# Patient Record
Sex: Female | Born: 1960 | Race: Black or African American | Hispanic: No | Smoking: Never smoker
Health system: Southern US, Community
[De-identification: ages and names within clinical notes are randomized; demographics above are authoritative.]

## PROBLEM LIST (undated history)

## (undated) DIAGNOSIS — N950 Postmenopausal bleeding: Secondary | ICD-10-CM

## (undated) DIAGNOSIS — E119 Type 2 diabetes mellitus without complications: Secondary | ICD-10-CM

## (undated) DIAGNOSIS — I10 Essential (primary) hypertension: Secondary | ICD-10-CM

## (undated) DIAGNOSIS — M67911 Unspecified disorder of synovium and tendon, right shoulder: Secondary | ICD-10-CM

## (undated) DIAGNOSIS — Z8639 Personal history of other endocrine, nutritional and metabolic disease: Secondary | ICD-10-CM

## (undated) DIAGNOSIS — R519 Headache, unspecified: Secondary | ICD-10-CM

## (undated) DIAGNOSIS — E7800 Pure hypercholesterolemia, unspecified: Secondary | ICD-10-CM

## (undated) DIAGNOSIS — E785 Hyperlipidemia, unspecified: Secondary | ICD-10-CM

## (undated) HISTORY — DX: Pure hypercholesterolemia, unspecified: E78.00

## (undated) HISTORY — DX: Type 2 diabetes mellitus without complications: E11.9

## (undated) HISTORY — PX: INDUCED ABORTION: SHX677

## (undated) HISTORY — DX: Personal history of other endocrine, nutritional and metabolic disease: Z86.39

## (undated) HISTORY — PX: TUBAL LIGATION: SHX77

---

## 2008-06-06 ENCOUNTER — Ambulatory Visit: Payer: Self-pay | Admitting: Nurse Practitioner

## 2008-06-06 DIAGNOSIS — I10 Essential (primary) hypertension: Secondary | ICD-10-CM

## 2008-06-06 DIAGNOSIS — E1159 Type 2 diabetes mellitus with other circulatory complications: Secondary | ICD-10-CM | POA: Insufficient documentation

## 2008-06-06 DIAGNOSIS — J309 Allergic rhinitis, unspecified: Secondary | ICD-10-CM | POA: Insufficient documentation

## 2008-06-06 LAB — CONVERTED CEMR LAB
AST: 12 units/L (ref 0–37)
Basophils Relative: 1 % (ref 0–1)
CO2: 24 meq/L (ref 19–32)
Chloride: 105 meq/L (ref 96–112)
Eosinophils Absolute: 0.3 10*3/uL (ref 0.0–0.7)
Lymphs Abs: 3.7 10*3/uL (ref 0.7–4.0)
MCHC: 31.9 g/dL (ref 30.0–36.0)
Potassium: 4.2 meq/L (ref 3.5–5.3)
RBC: 5.51 M/uL — ABNORMAL HIGH (ref 3.87–5.11)
RDW: 16 % — ABNORMAL HIGH (ref 11.5–15.5)
TSH: 0.674 microintl units/mL (ref 0.350–4.50)
WBC: 8.4 10*3/uL (ref 4.0–10.5)

## 2008-06-07 ENCOUNTER — Encounter (INDEPENDENT_AMBULATORY_CARE_PROVIDER_SITE_OTHER): Payer: Self-pay | Admitting: Nurse Practitioner

## 2008-07-13 ENCOUNTER — Ambulatory Visit: Payer: Self-pay | Admitting: Nurse Practitioner

## 2008-07-13 ENCOUNTER — Encounter (INDEPENDENT_AMBULATORY_CARE_PROVIDER_SITE_OTHER): Payer: Self-pay | Admitting: Nurse Practitioner

## 2008-07-13 LAB — CONVERTED CEMR LAB
Blood in Urine, dipstick: NEGATIVE
Chlamydia, DNA Probe: NEGATIVE
Cholesterol: 174 mg/dL (ref 0–200)
GC Probe Amp, Genital: NEGATIVE
KOH Prep: NEGATIVE
LDL Cholesterol: 118 mg/dL — ABNORMAL HIGH (ref 0–99)
Protein, U semiquant: NEGATIVE
Triglycerides: 79 mg/dL (ref <150)
VLDL: 16 mg/dL (ref 0–40)

## 2008-07-14 ENCOUNTER — Encounter (INDEPENDENT_AMBULATORY_CARE_PROVIDER_SITE_OTHER): Payer: Self-pay | Admitting: Nurse Practitioner

## 2008-07-18 ENCOUNTER — Ambulatory Visit (HOSPITAL_COMMUNITY): Admission: RE | Admit: 2008-07-18 | Discharge: 2008-07-18 | Payer: Self-pay | Admitting: Family Medicine

## 2008-07-22 ENCOUNTER — Encounter: Admission: RE | Admit: 2008-07-22 | Discharge: 2008-07-22 | Payer: Self-pay | Admitting: Family Medicine

## 2008-07-22 ENCOUNTER — Encounter (INDEPENDENT_AMBULATORY_CARE_PROVIDER_SITE_OTHER): Payer: Self-pay | Admitting: Family Medicine

## 2008-11-10 ENCOUNTER — Ambulatory Visit: Payer: Self-pay | Admitting: Nurse Practitioner

## 2008-11-10 DIAGNOSIS — E669 Obesity, unspecified: Secondary | ICD-10-CM

## 2008-11-10 LAB — CONVERTED CEMR LAB
Bilirubin Urine: NEGATIVE
Ketones, urine, test strip: NEGATIVE
Nitrite: NEGATIVE
Specific Gravity, Urine: >=1.03
pH: 6

## 2008-11-11 ENCOUNTER — Encounter (INDEPENDENT_AMBULATORY_CARE_PROVIDER_SITE_OTHER): Payer: Self-pay | Admitting: Nurse Practitioner

## 2008-11-11 LAB — CONVERTED CEMR LAB: Microalb, Ur: 5.36 mg/dL — ABNORMAL HIGH (ref 0.00–1.89)

## 2008-12-01 ENCOUNTER — Ambulatory Visit: Payer: Self-pay | Admitting: Nurse Practitioner

## 2009-06-14 ENCOUNTER — Encounter (INDEPENDENT_AMBULATORY_CARE_PROVIDER_SITE_OTHER): Payer: Self-pay | Admitting: Nurse Practitioner

## 2009-06-14 ENCOUNTER — Ambulatory Visit: Payer: Self-pay | Admitting: Internal Medicine

## 2009-08-02 ENCOUNTER — Ambulatory Visit: Payer: Self-pay | Admitting: Nurse Practitioner

## 2009-08-02 DIAGNOSIS — N3941 Urge incontinence: Secondary | ICD-10-CM

## 2009-08-02 LAB — CONVERTED CEMR LAB
Glucose, Urine, Semiquant: NEGATIVE
Microalb, Ur: 1.03 mg/dL (ref 0.00–1.89)
Nitrite: NEGATIVE
Urobilinogen, UA: 0.2

## 2009-08-09 ENCOUNTER — Encounter (INDEPENDENT_AMBULATORY_CARE_PROVIDER_SITE_OTHER): Payer: Self-pay | Admitting: Nurse Practitioner

## 2009-10-05 ENCOUNTER — Ambulatory Visit: Payer: Self-pay | Admitting: Nurse Practitioner

## 2009-10-05 DIAGNOSIS — K029 Dental caries, unspecified: Secondary | ICD-10-CM | POA: Insufficient documentation

## 2009-10-05 HISTORY — DX: Dental caries, unspecified: K02.9

## 2009-10-05 LAB — CONVERTED CEMR LAB
AST: 11 units/L (ref 0–37)
Alkaline Phosphatase: 54 units/L (ref 39–117)
BUN: 14 mg/dL (ref 6–23)
Basophils Absolute: 0.1 10*3/uL (ref 0.0–0.1)
Basophils Relative: 1 % (ref 0–1)
CO2: 28 meq/L (ref 19–32)
Calcium: 8.6 mg/dL (ref 8.4–10.5)
Eosinophils Absolute: 0.2 10*3/uL (ref 0.0–0.7)
HDL: 39 mg/dL — ABNORMAL LOW (ref >39)
Hemoglobin: 13.2 g/dL (ref 12.0–15.0)
Lymphs Abs: 3.1 10*3/uL (ref 0.7–4.0)
MCV: 79.2 fL (ref 78.0–100.0)
Monocytes Relative: 6 % (ref 3–12)
Neutro Abs: 3 10*3/uL (ref 1.7–7.7)
Neutrophils Relative %: 44 % (ref 43–77)
Platelets: 281 10*3/uL (ref 150–400)
TSH: 0.929 microintl units/mL (ref 0.350–4.500)
Total Protein: 6.6 g/dL (ref 6.0–8.3)
WBC: 6.7 10*3/uL (ref 4.0–10.5)

## 2009-10-06 ENCOUNTER — Encounter (INDEPENDENT_AMBULATORY_CARE_PROVIDER_SITE_OTHER): Payer: Self-pay | Admitting: Nurse Practitioner

## 2009-10-09 ENCOUNTER — Encounter: Admission: RE | Admit: 2009-10-09 | Discharge: 2009-10-09 | Payer: Self-pay | Admitting: Internal Medicine

## 2009-10-10 LAB — CONVERTED CEMR LAB: Pap Smear: NEGATIVE

## 2010-02-05 ENCOUNTER — Ambulatory Visit: Payer: Self-pay | Admitting: Nurse Practitioner

## 2010-03-05 ENCOUNTER — Ambulatory Visit: Payer: Self-pay | Admitting: Nurse Practitioner

## 2010-06-19 NOTE — Letter (Signed)
Summary: DENTAL REFERRAL  DENTAL REFERRAL   Imported By: Arta Bruce 10/05/2009 15:47:30  _____________________________________________________________________  External Attachment:    Type:   Image     Comment:   External Document

## 2010-06-19 NOTE — Progress Notes (Signed)
Summary: Office Visit//DEPRESSION SCREENING  Office Visit//DEPRESSION SCREENING   Imported By: Arta Bruce 11/27/2009 10:48:39  _____________________________________________________________________  External Attachment:    Type:   Image     Comment:   External Document

## 2010-06-19 NOTE — Assessment & Plan Note (Signed)
Summary: Complete Physical Exam   Vital Signs:  Patient profile:   50 year old female Menstrual status:  postmenopausal Weight:      228.3 pounds BMI:     49.79 BSA:     2.25 Temp:     98.3 degrees F oral Pulse rate:   70 / minute Pulse rhythm:   regular BP sitting:   135 / 82  (left arm) Cuff size:   large  Vitals Entered By: Levon Hedger (Oct 05, 2009 11:50 AM) CC: CPP, Hypertension Management Is Patient Diabetic? No Pain Assessment Patient in pain? no       Does patient need assistance? Functional Status Self care Ambulation Normal   CC:  CPP and Hypertension Management.  History of Present Illness:  Pt into the office for a complete physical exam  Pap - no recent dental exam post menopausal  mammogram - last done one year ago. had to return for additional testing - u/s which just advised f/u every 6 months  Optho - wears glasses. last eye exam several years ago  dental - no recent dental exam. does admit that she has several teeth that need removal and care  Hypertension History:      She denies headache, chest pain, and palpitations.  She notes no problems with any antihypertensive medication side effects.  restarted on bp meds during last visit.        Positive major cardiovascular risk factors include hypertension.  Negative major cardiovascular risk factors include female age less than 48 years old, negative family history for ischemic heart disease, and non-tobacco-user status.        Further assessment for target organ damage reveals no history of ASHD, cardiac end-organ damage (CHF/LVH), stroke/TIA, peripheral vascular disease, renal insufficiency, or hypertensive retinopathy.     Habits & Providers  Alcohol-Tobacco-Diet     Alcohol drinks/day: 0     Tobacco Status: never  Exercise-Depression-Behavior     Does Patient Exercise: no     Drug Use: no     Seat Belt Use: 100     Sun Exposure: occasionally  Comments: PHQ-9 score =  0  Allergies (verified): No Known Drug Allergies  Review of Systems General:  mouth pain at times from caried teeth. Eyes:  Denies blurring. ENT:  Denies earache. CV:  Denies chest pain or discomfort. Resp:  Denies cough. GI:  Denies abdominal pain, nausea, and vomiting. GU:  Denies discharge. MS:  Denies joint pain. Derm:  Denies rash. Neuro:  Denies headaches. Psych:  Denies anxiety and depression.  Physical Exam  General:  alert.   Head:  normocephalic.   Eyes:  pupils equal and pupils round.   Ears:  bil TM with bony landmarks present Nose:  no nasal discharge.   Mouth:  poor dentition.   Neck:  supple.   Chest Wall:  no tenderness.   Breasts:  pendulous no masses.   Lungs:  normal breath sounds.   Heart:  normal rate and regular rhythm.   Abdomen:  soft, non-tender, and normal bowel sounds.   Rectal:  no external abnormalities.   Msk:  normal ROM.   Pulses:  R radial normal and L radial normal.   Extremities:  no edema Neurologic:  alert & oriented X3.   Skin:  color normal.   Psych:  Oriented X3.    Pelvic Exam  Vulva:      normal appearance.   Urethra and Bladder:      Urethra--normal.  Vagina:      physiologic discharge.   Cervix:      midposition.   Uterus:      smooth.   Adnexa:      nontender bilaterally.   Rectum:      normal, heme negative stool.      Impression & Recommendations:  Problem # 1:  ROUTINE GYNECOLOGICAL EXAMINATION (ICD-V72.31) PHQ- 9 score = 0 EKG done PAP done guaiac negative colonscopy indications given to pt rec eye and dental exam - pt would like referral to dental clinic Orders: UA Dipstick w/o Micro (manual) (16109) KOH/ WET Mount [phone]) Pap Smear, Thin Prep ( Collection of) (Q0091) T- GC Chlamydia (09811) Hemoccult Guaiac-1 spec.(in office) (82270)  Problem # 2:  OTHER SCREENING BREAST EXAMINATION (ICD-V76.19) self breast exams reviewed with pt mammogram scheduled Orders: Mammogram (Screening)  (Mammo)  Problem # 3:  DENTAL CARIES (ICD-521.00) will refer to denal clinic  Problem # 4:  OBESITY (ICD-278.00) advised pt that she needs to increase her exercise  Problem # 5:  HYPERTENSION, BENIGN ESSENTIAL (ICD-401.1) stable at this time DASH diet reviewed Her updated medication list for this problem includes:    Lisinopril-hydrochlorothiazide 20-25 Mg Tabs (Lisinopril-hydrochlorothiazide) .Marland Kitchen... 1 tablet by mouth daily  Orders: T-Lipid Profile (91478-29562) T-Comprehensive Metabolic Panel [phone]) T-CBC w/Diff (96295-28413) T-TSH (24401-02725) Rapid HIV  (92370) EKG w/ Interpretation (93000)  Complete Medication List: 1)  Lisinopril-hydrochlorothiazide 20-25 Mg Tabs (Lisinopril-hydrochlorothiazide) .Marland Kitchen.. 1 tablet by mouth daily 2)  Loratadine 10 Mg Tabs (Loratadine) .Marland Kitchen.. 1 tablet by mouth daily for allergies  Other Orders: Dental Referral (Dentist)  Hypertension Assessment/Plan:      The patient's hypertensive risk group is category A: No risk factors and no target organ damage.  Her calculated 10 year risk of coronary heart disease is 9 %.  Today's blood pressure is 135/82.  Her blood pressure goal is < 140/90.  Patient Instructions: 1)  You will be informed of any abnormal labs. 2)  Your referral will be sent to the dental clinic.  You will be notified directly of the time/date of the appointment 3)  Follow up in 4-6 months or sooner if needed.   EKG  Procedure date:  10/05/2009  Findings:      NSR   Laboratory Results  Date/Time Received: Oct 06, 2009 2:01 PM   [employer] Source: vaginal WBC/hpf: 1-5 Bacteria/hpf: rare Clue cells/hpf: none Yeast/hpf: none Wet Mount KOH: Negative Trichomonas/hpf: none  Stool - Occult Blood Hemmoccult #1: negative Date: 10/06/2009    Appended Document: Complete Physical Exam     Allergies: No Known Drug Allergies   Complete Medication List: 1)  Lisinopril-hydrochlorothiazide 20-25 Mg Tabs  (Lisinopril-hydrochlorothiazide) .Marland Kitchen.. 1 tablet by mouth daily 2)  Loratadine 10 Mg Tabs (Loratadine) .Marland Kitchen.. 1 tablet by mouth daily for allergies   Laboratory Results   Urine Tests  Date/Time Received: Oct 06, 2009 5:36 PM   Routine Urinalysis   Color: lt. yellow Appearance: Clear Glucose: negative   (Normal Range: Negative) Bilirubin: negative   (Normal Range: Negative) Ketone: negative   (Normal Range: Negative) Spec. Gravity: 1.025   (Normal Range: 1.003-1.035) Blood: negative   (Normal Range: Negative) pH: 5.0   (Normal Range: 5.0-8.0) Protein: negative   (Normal Range: Negative) Urobilinogen: 0.2   (Normal Range: 0-1) Nitrite: negative   (Normal Range: Negative) Leukocyte Esterace: negative   (Normal Range: Negative)

## 2010-06-19 NOTE — Letter (Signed)
Summary: Lipid Letter  [facility]  [address] [place], [place] [address]   Phone: [phone]  Fax: [phone]    10/06/2009  Waneta Fitting [address] [place], [place]  [address]  Dear Clydie Braun:  We have carefully reviewed your last lipid profile from 10/05/2009 and the results are noted below with a summary of recommendations for lipid management.    Cholesterol:       146     Goal: less than 200   HDL "good" Cholesterol:   39     Goal: greater than 40   LDL "bad" Cholesterol:   92     Goal: less than 100   Triglycerides:       76     Goal: less than 150    Labs done during recent office visit are normal.  Pap Smear results _____________________.     Current Medications: 1)    Lisinopril-hydrochlorothiazide 20-25 Mg Tabs (Lisinopril-hydrochlorothiazide) .Marland Kitchen.. 1 tablet by mouth daily 2)    Loratadine 10 Mg Tabs (Loratadine) .Marland Kitchen.. 1 tablet by mouth daily for allergies  If you have any questions, please call. We appreciate being able to work with you.   Sincerely,    [facility] Lehman Prom FNP

## 2010-06-19 NOTE — Letter (Signed)
Summary: RETASURE  RETASURE   Imported By: Arta Bruce 10/19/2009 14:29:01  _____________________________________________________________________  External Attachment:    Type:   Image     Comment:   External Document

## 2010-06-19 NOTE — Assessment & Plan Note (Signed)
Summary: 4 WEEK FU FOR BP CHECK///KT  Nurse Visit   Vital Signs:  Patient profile:   50 year old female Menstrual status:  postmenopausal Pulse rate:   88 / minute Pulse rhythm:   regular Resp:     20 per minute BP sitting:   126 / 94  (left arm) Cuff size:   large  Vitals Entered By: Dutch Quint RN (March 05, 2010 3:29 PM)  Patient Instructions: 1)  Reviewed with Wende Mott 2)  Blood pressure is still elevated, but better - 126/94 3)  Continue taking medications as ordered. 4)  Watch salt intake -- avoid frequent use of processed foods such as cold cuts, hot dogs, canned soup. 5)  You received your flu shot today -- please read handout 6)  Return for follow-up blood pressure check in 3 weeks with triage nurse. 7)  Call if anything changes or if you have any questions. 2  Physical Exam  Lungs:  normal respiratory effort, normal breath sounds, no crackles, and no wheezes.   Heart:  normal rate and regular rhythm.     Impression & Recommendations:  Problem # 1:  HYPERTENSION, BENIGN ESSENTIAL (ICD-401.1) BP better, but still elevated  126/94 Continue medications as ordered Advised to watch salt intake, especially use of processed foods and soda Received flu shot Return for f/u BP check in 3 weeks with triage nurse  Her updated medication list for this problem includes:    Lisinopril-hydrochlorothiazide 20-25 Mg Tabs (Lisinopril-hydrochlorothiazide) .Marland Kitchen... 1 tablet by mouth daily  Complete Medication List: 1)  Lisinopril-hydrochlorothiazide 20-25 Mg Tabs (Lisinopril-hydrochlorothiazide) .Marland Kitchen.. 1 tablet by mouth daily 2)  Loratadine 10 Mg Tabs (Loratadine) .Marland Kitchen.. 1 tablet by mouth daily for allergies 3)  Womens Multivitamin Plus Tabs (Multiple vitamins-minerals) .... Take one tablet by mouth daily  Other Orders: Flu Vaccine 75yrs + (74259) Admin 1st Vaccine (56387)   CC:  4 week BP recheck.  History of Present Illness: Took all of her meds today.  Denies cough,  visual changes.  States she only has headache when she gets up in the morning.   Review of Systems CV:  Denies bluish discoloration of lips or nails, chest pain or discomfort, difficulty breathing at night, difficulty breathing while lying down, fainting, fatigue, leg cramps with exertion, lightheadness, near fainting, palpitations, shortness of breath with exertion, swelling of feet, swelling of hands, and weight gain.  CC: 4 week BP recheck Is Patient Diabetic? No  Does patient need assistance? Functional Status Self care Ambulation Normal   Allergies: No Known Drug Allergies  Immunizations Administered:  Influenza Vaccine # 1:    Vaccine Type: Fluvax 3+    Site: right deltoid    Mfr: GlaxoSmithKline    Dose: 0.5 ml    Route: IM    Given by: Dutch Quint RN    Exp. Date: 11/17/2010    Lot #: [id number]    VIS given: 12/12/09 version given March 05, 2010.  Flu Vaccine Consent Questions:    Do you have a history of severe allergic reactions to this vaccine? no    Any prior history of allergic reactions to egg and/or gelatin? no    Do you have a sensitivity to the preservative Thimersol? no    Do you have a past history of Guillan-Barre Syndrome? no    Do you currently have an acute febrile illness? no    Have you ever had a severe reaction to latex? no    Vaccine information given and explained  to patient? yes    Are you currently pregnant? no  Orders Added: 1)  Flu Vaccine 56yrs + [90658] 2)  Admin 1st Vaccine [90471] 3)  Est. Patient Level I [03474]

## 2010-06-19 NOTE — Letter (Signed)
Summary: *HSN Results Follow up  [facility]  [address] [place], [place] [address]   Phone: [phone]  Fax: [phone]      08/09/2009   Holly Hendrix [address] [place], [place]  [address]   Dear  [name],                            ____S.Drinkard,FNP   ____D. Gore,FNP       ____B. McPherson,MD   ____V. Rankins,MD    ____E. Mulberry,MD    _X___N. Daphine Deutscher, FNP  ____D. Reche Dixon, MD    ____K. Philipp Deputy, MD    ____Other     This letter is to inform you that your recent test(s):  [facility] Eye Exam   ___X____ is within acceptable limits  _______ requires a medication change  _______ requires a follow-up lab visit  _______ requires a follow-up visit with your provider   Comments: Eye screening was normal.       _________________________________________________________ If you have any questions, please contact our office [phone].                    Sincerely,    Lehman Prom FNP [facility]

## 2010-06-19 NOTE — Miscellaneous (Signed)
Summary: [facility] results   Diabetes Management History:      She says that she is not exercising regularly.    Diabetes Management Exam:    Eye Exam:       Eye Exam done elsewhere          Date: 06/14/2009          Results: normal          Done by: [facility]  Diabetes Management Assessment/Plan:      Her blood pressure goal is < 140/90.   Clinical Lists Changes  Observations: Added new observation of EYES COMMENT: 06/2010 (08/09/2009 7:36) Added new observation of EYE EXAM BY: [facility] (06/14/2009 7:36) Added new observation of DMEYEEXMRES: normal (06/14/2009 7:36) Added new observation of DIAB EYE EX: normal (06/14/2009 7:36)

## 2010-06-19 NOTE — Assessment & Plan Note (Signed)
Summary: HTN   Vital Signs:  Patient profile:   50 year old female Menstrual status:  postmenopausal Weight:      232.2 pounds BMI:     40.96 BSA:     2.07 Temp:     97.5 degrees F oral Pulse rate:   85 / minute Pulse rhythm:   regular Resp:     20 per minute BP sitting:   130 / 83  (left arm) Cuff size:   large  Vitals Entered By: Levon Hedger (August 02, 2009 3:22 PM) CC: follow-up visit HTN...bladder leakage for a while and wants to know what she can do about, Hypertension Management Is Patient Diabetic? No Pain Assessment Patient in pain? no       Does patient need assistance? Functional Status Self care Ambulation Normal     Menstrual Status postmenopausal Last PAP Result  Specimen Adequacy: Satisfactory for evaluation.   Interpretation/Result:Negative for intraepithelial Lesion or Malignancy.      CC:  follow-up visit HTN...bladder leakage for a while and wants to know what she can do about and Hypertension Management.  History of Present Illness:  Pt into the office for f/u on high blood pressure  Obesity - 12 pound weight gain since last visit.  Pt acknowledges that she needs to monitor her weight. Admits that she has not been exercising as ordered  Hypertension History:      She denies headache, chest pain, and palpitations.  She notes no problems with any antihypertensive medication side effects.  Taking blood pressure medications as ordered.        Positive major cardiovascular risk factors include hypertension.  Negative major cardiovascular risk factors include female age less than 9 years old, negative family history for ischemic heart disease, and non-tobacco-user status.        Further assessment for target organ damage reveals no history of ASHD, cardiac end-organ damage (CHF/LVH), stroke/TIA, peripheral vascular disease, renal insufficiency, or hypertensive retinopathy.     Habits & Providers  Alcohol-Tobacco-Diet     Alcohol drinks/day: 0     Tobacco Status: never  Exercise-Depression-Behavior     Does Patient Exercise: no     Have you felt down or hopeless? no     Have you felt little pleasure in things? no     Drug Use: no     Seat Belt Use: 100     Sun Exposure: occasionally  Medications Prior to Update: 1)  Lisinopril-Hydrochlorothiazide 20-25 Mg Tabs (Lisinopril-Hydrochlorothiazide) .Marland Kitchen.. 1 Tablet By Mouth Daily 2)  Loratadine 10 Mg Tabs (Loratadine) .Marland Kitchen.. 1 Tablet By Mouth Daily For Allergies  Allergies (verified): No Known Drug Allergies  Review of Systems ENT:  Denies earache. CV:  Denies chest pain or discomfort. Resp:  Denies cough. GU:  Complains of incontinence and urinary frequency; denies dysuria and nocturia; wears incontinence pads. cough or laughing does not give her urge.  Marland Kitchen  Physical Exam  General:  alert.   Head:  normocephalic.   Eyes:  glasses Lungs:  normal breath sounds.   Heart:  normal rate and regular rhythm.   Msk:  up to the exam table Skin:  color normal.   Psych:  Oriented X3.     Impression & Recommendations:  Problem # 1:  HYPERTENSION, BENIGN ESSENTIAL (ICD-401.1) DASH diet continue current medication Her updated medication list for this problem includes:    Lisinopril-hydrochlorothiazide 20-25 Mg Tabs (Lisinopril-hydrochlorothiazide) .Marland Kitchen... 1 tablet by mouth daily  Orders: T-Urine Microalbumin w/creat. ratio [phone])  Problem #  2:  INCONTINENCE, URGE (ICD-788.31) advised pt to limit caffiene kegal exercise Orders: UA Dipstick w/o Micro (manual) (86578)  Complete Medication List: 1)  Lisinopril-hydrochlorothiazide 20-25 Mg Tabs (Lisinopril-hydrochlorothiazide) .Marland Kitchen.. 1 tablet by mouth daily 2)  Loratadine 10 Mg Tabs (Loratadine) .Marland Kitchen.. 1 tablet by mouth daily for allergies  Hypertension Assessment/Plan:      The patient's hypertensive risk group is category A: No risk factors and no target organ damage.  Her calculated 10 year risk of coronary heart disease  is 9 %.  Today's blood pressure is 130/83.  Her blood pressure goal is < 140/90.  Patient Instructions: 1)  Schedule an appointment for a complete physical exam in next 4-6 weeks. 2)  Do not eat after midnight before this visit.   You will need labs, EKG, PAP, PHQ-9. 3)  You should try to lose weight.  Do this by increasing your exercise. Plan to walk for 15-20 minutes 4 times per week. 4)  Eat small protions (when you feel full stop) 5)  Eat more fruit and vegetables 6)  Bladder - Do kegal exercises 7)  Drink decaffinated tea, soda, coffee. 8)  May need to change your blood pressure medication too if diuretic is a problem.  Laboratory Results   Urine Tests  Date/Time Received: August 02, 2009 3:40 PM   Routine Urinalysis   Color: lt. yellow Appearance: Clear Glucose: negative   (Normal Range: Negative) Bilirubin: negative   (Normal Range: Negative) Ketone: negative   (Normal Range: Negative) Spec. Gravity: 1.025   (Normal Range: 1.003-1.035) Blood: negative   (Normal Range: Negative) pH: 6.0   (Normal Range: 5.0-8.0) Protein: negative   (Normal Range: Negative) Urobilinogen: 0.2   (Normal Range: 0-1) Nitrite: negative   (Normal Range: Negative) Leukocyte Esterace: negative   (Normal Range: Negative)

## 2010-06-19 NOTE — Assessment & Plan Note (Signed)
Summary: HTN    Vital Signs:  Patient profile:   50 year old female Menstrual status:  postmenopausal Weight:      235.5 pounds BMI:     41.54 Temp:     97.6 degrees F oral Pulse rate:   76 / minute Pulse rhythm:   regular Resp:     20 per minute BP sitting:   150 / 100  (left arm) Cuff size:   large  Vitals Entered By: Levon Hedger (February 05, 2010 10:23 AM)  Nutrition Counseling: Patient's BMI is greater than 25 and therefore counseled on weight management options. CC: follow-up visit, Hypertension Management Is Patient Diabetic? No Pain Assessment Patient in pain? no       Does patient need assistance? Functional Status Self care Ambulation Normal   CC:  follow-up visit and Hypertension Management.  History of Present Illness:  Pt into the office for f/u on HTN Pt presents today with her medication.  Obesity - pt is up 7 pounds since her visit. Pt is not excercising so her weight has increased back.  Dentist - pt has been getting ongoing dental care and she has an appt later this week.  Social - She is working and currently employed at [employer].  No acute problems today.  Hypertension History:      She denies headache, chest pain, and palpitations.  She notes no problems with any antihypertensive medication side effects.  Pt is taking her blood pressure medications as ordered. She reports that she has been told by the dentist that her blood pressure has been elevated.  Further comments include: Pt has already taken her BP meds today.        Positive major cardiovascular risk factors include hypertension.  Negative major cardiovascular risk factors include female age less than 51 years old, negative family history for ischemic heart disease, and non-tobacco-user status.        Further assessment for target organ damage reveals no history of ASHD, cardiac end-organ damage (CHF/LVH), stroke/TIA, peripheral vascular disease, renal insufficiency, or  hypertensive retinopathy.     Habits & Providers  Alcohol-Tobacco-Diet     Alcohol drinks/day: 0     Tobacco Status: never  Exercise-Depression-Behavior     Does Patient Exercise: no     Have you felt down or hopeless? no     Have you felt little pleasure in things? no     Depression Counseling: not indicated; screening negative for depression     Drug Use: no     Seat Belt Use: 100     Sun Exposure: occasionally  Medications Prior to Update: 1)  Lisinopril-Hydrochlorothiazide 20-25 Mg Tabs (Lisinopril-Hydrochlorothiazide) .Marland Kitchen.. 1 Tablet By Mouth Daily 2)  Loratadine 10 Mg Tabs (Loratadine) .Marland Kitchen.. 1 Tablet By Mouth Daily For Allergies  Allergies (verified): No Known Drug Allergies  Review of Systems General:  Denies fatigue. CV:  Denies chest pain or discomfort and swelling of feet. Resp:  Denies cough. GI:  Denies abdominal pain, nausea, and vomiting.  Physical Exam  General:  alert.  obese Head:  normocephalic.   Ears:  ear piercing(s) noted.   Mouth:  fair dentition.  right front tooth protrusion Lungs:  normal breath sounds.   Heart:  normal rate and regular rhythm.   Abdomen:  normal bowel sounds.   Msk:  up to the exam Neurologic:  alert & oriented X3.   Skin:  color normal.   Psych:  Oriented X3.     Impression &  Recommendations:  Problem # 1:  HYPERTENSION, BENIGN ESSENTIAL (ICD-401.1) BP is elevated today Reviewed DASH diet Her updated medication list for this problem includes:    Lisinopril-hydrochlorothiazide 20-25 Mg Tabs (Lisinopril-hydrochlorothiazide) .Marland Kitchen... 1 tablet by mouth daily  Problem # 2:  OBESITY (ICD-278.00) pt is up 7 pounds since her last visit  Problem # 3:  DENTAL CARIES (ICD-521.00) dental care is ongoing.  advised pt to get her BP checked there later this week and report that value to this office  Complete Medication List: 1)  Lisinopril-hydrochlorothiazide 20-25 Mg Tabs (Lisinopril-hydrochlorothiazide) .Marland Kitchen.. 1 tablet by mouth  daily 2)  Loratadine 10 Mg Tabs (Loratadine) .Marland Kitchen.. 1 tablet by mouth daily for allergies 3)  Womens Multivitamin Plus Tabs (Multiple vitamins-minerals) .... Take one tablet by mouth daily  Hypertension Assessment/Plan:      The patient's hypertensive risk group is category A: No risk factors and no target organ damage.  Her calculated 10 year risk of coronary heart disease is 11 %.  Today's blood pressure is 150/100.  Her blood pressure goal is < 140/90.  Patient Instructions: 1)  Blood pressure is high today. 2)  Get your blood pressure checked when you go to the dentist this week.  Record this value.  Call this office and leave a message with what the blood pressure is 3)  Consider getting the flu vaccine. This is indicated yearly for all adults 4)  Follow up in this office in 3 weeks for nurse visit - blood pressure check.  Goal is 140/90. 5)  Follow up in 4 months with n.martin,fnp for high blood pressure Prescriptions: LISINOPRIL-HYDROCHLOROTHIAZIDE 20-25 MG TABS (LISINOPRIL-HYDROCHLOROTHIAZIDE) 1 tablet by mouth daily  #30 Each x 4   Entered and Authorized by:   Lehman Prom FNP   Signed by:   Lehman Prom FNP on 02/05/2010   Method used:   Print then Give to Patient   RxID:   [id number]

## 2010-07-02 ENCOUNTER — Encounter: Payer: Self-pay | Admitting: Nurse Practitioner

## 2010-07-02 ENCOUNTER — Encounter (INDEPENDENT_AMBULATORY_CARE_PROVIDER_SITE_OTHER): Payer: Self-pay | Admitting: Nurse Practitioner

## 2010-07-02 DIAGNOSIS — J00 Acute nasopharyngitis [common cold]: Secondary | ICD-10-CM | POA: Insufficient documentation

## 2010-07-11 NOTE — Letter (Signed)
Summary: Handout Printed  Printed Handout:  - Cold, Common, Adult 

## 2010-07-11 NOTE — Assessment & Plan Note (Signed)
Summary: HTN   Vital Signs:  Patient profile:   50 year old female Menstrual status:  postmenopausal Weight:      235.3 pounds BMI:     41.50 Temp:     98.0 degrees F oral Pulse rate:   80 / minute Pulse rhythm:   regular Resp:     20 per minute BP sitting:   130 / 80  (left arm) Cuff size:   large  Vitals Entered By: Levon Hedger (July 02, 2010 2:45 PM)  Nutrition Counseling: Patient's BMI is greater than 25 and therefore counseled on weight management options. CC: follow-up visit HTN, URI symptoms, Hypertension Management Is Patient Diabetic? No Pain Assessment Patient in pain? no       Does patient need assistance? Functional Status Self care Ambulation Normal   CC:  follow-up visit HTN, URI symptoms, and Hypertension Management.  History of Present Illness:       This is a 50 year old woman who presents with URI symptoms.  The symptoms began 1 week ago.  The severity is described as moderate.  The patient complains of nasal congestion and sick contacts.  The patient denies fever.  The patient denies sneezing, muscle aches, and severe fatigue.  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  Pt has been taking [facility] and another over the counter medication for congestion.  Hypertension History:      She denies headache, chest pain, and palpitations.  She notes no problems with any antihypertensive medication side effects.  Pt is taking meds as ordered.  Needs refills on meds.        Positive major cardiovascular risk factors include hypertension.  Negative major cardiovascular risk factors include female age less than 40 years old, negative family history for ischemic heart disease, and non-tobacco-user status.        Further assessment for target organ damage reveals no history of ASHD, cardiac end-organ damage (CHF/LVH), stroke/TIA, peripheral vascular disease, renal insufficiency, or hypertensive retinopathy.     Habits  & Providers  Alcohol-Tobacco-Diet     Alcohol drinks/day: 0     Tobacco Status: never  Exercise-Depression-Behavior     Does Patient Exercise: no     Depression Counseling: not indicated; screening negative for depression     Drug Use: no     Seat Belt Use: 100     Sun Exposure: occasionally  Medications Prior to Update: 1)  Lisinopril-Hydrochlorothiazide 20-25 Mg Tabs (Lisinopril-Hydrochlorothiazide) .Marland Kitchen.. 1 Tablet By Mouth Daily 2)  Loratadine 10 Mg Tabs (Loratadine) .Marland Kitchen.. 1 Tablet By Mouth Daily For Allergies 3)  Womens Multivitamin Plus  Tabs (Multiple Vitamins-Minerals) .... Take One Tablet By Mouth Daily  Current Medications (verified): 1)  Lisinopril-Hydrochlorothiazide 20-25 Mg Tabs (Lisinopril-Hydrochlorothiazide) .Marland Kitchen.. 1 Tablet By Mouth Daily 2)  Loratadine 10 Mg Tabs (Loratadine) .Marland Kitchen.. 1 Tablet By Mouth Daily For Allergies 3)  Womens Multivitamin Plus  Tabs (Multiple Vitamins-Minerals) .... Take One Tablet By Mouth Daily  Allergies (verified): No Known Drug Allergies  Review of Systems General:  Denies loss of appetite. ENT:  Complains of nasal congestion. CV:  Denies chest pain or discomfort. Resp:  Complains of cough. Neuro:  Complains of headaches.  Physical Exam  General:  alert.   Head:  normocephalic.   Ears:  ear piercing(s) noted.   Lungs:  normal breath sounds.   Heart:  normal rate and regular rhythm.   Abdomen:  normal bowel sounds.  Msk:  Up to the exam table Neurologic:  alert & oriented X3.   Skin:  color normal.   Psych:  Oriented X3.     Impression & Recommendations:  Problem # 1:  HYPERTENSION, BENIGN ESSENTIAL (ICD-401.1) BP stable DASH diet meds refilled for 1 year Her updated medication list for this problem includes:    Lisinopril-hydrochlorothiazide 20-25 Mg Tabs (Lisinopril-hydrochlorothiazide) .Marland Kitchen... 1 tablet by mouth daily  Problem # 2:  ACUTE NASOPHARYNGITIS (ICD-460) advised pt to take OTC meds but with caution as they may  raise BP handout given The following medications were removed from the medication list:    Loratadine 10 Mg Tabs (Loratadine) .Marland Kitchen... 1 tablet by mouth daily for allergies  Problem # 3:  OBESITY (ICD-278.00) still encourage increase in exercise  Complete Medication List: 1)  Lisinopril-hydrochlorothiazide 20-25 Mg Tabs (Lisinopril-hydrochlorothiazide) .Marland Kitchen.. 1 tablet by mouth daily 2)  Womens Multivitamin Plus Tabs (Multiple vitamins-minerals) .... Take one tablet by mouth daily  Hypertension Assessment/Plan:      The patient's hypertensive risk group is category A: No risk factors and no target organ damage.  Her calculated 10 year risk of coronary heart disease is 7 %.  Today's blood pressure is 130/80.  Her blood pressure goal is < 140/90.  Patient Instructions: 1)  Follow up in 6 months for a complete physical exam or sooner if office if necessary. 2)  Come fasting for labs.  You will need PAP, mammogram, u/a, EKG. 3)  You have a common cold.  Read the handout. Ok to take the medications you have shown here today since your blood pressure is doing well. 4)  Refills on your blood pressure medications have been sent to the pharmacy. Prescriptions: LISINOPRIL-HYDROCHLOROTHIAZIDE 20-25 MG TABS (LISINOPRIL-HYDROCHLOROTHIAZIDE) 1 tablet by mouth daily  #90 x 3   Entered and Authorized by:   Lehman Prom FNP   Signed by:   Lehman Prom FNP on 07/02/2010   Method used:   Electronically to        [facility] (retail)       [address]       [place], [place]  [address]       Ph: [phone]       Fax: [phone]   RxID:   [id number]    Orders Added: 1)  Est. Patient Level III [44034]

## 2010-07-23 ENCOUNTER — Telehealth (INDEPENDENT_AMBULATORY_CARE_PROVIDER_SITE_OTHER): Payer: Self-pay | Admitting: Nurse Practitioner

## 2010-07-31 NOTE — Progress Notes (Signed)
Summary: NEEDS BP MEDS REFILL  Phone Note Call from Patient Call back at Home Phone [phone]   Summary of Call: MARTI PT. MS FULLER CALLED BECAUSE SHE IS REQUESTING REFILLS ON HER BP MEDICATION SINCE LAST WEEK. SHE USES [facility] ON [address]. Initial call taken by: Leodis Rains,  July 23, 2010 12:59 PM  Follow-up for Phone Call        Per pharmacy -- pt. was listed under last names of Toni Arthurs and Kendall.  Refills completed.  Left message on voicemail for pt. to return call.  Dutch Quint RN  July 23, 2010 4:07 PM  Left message on voicemail for pt. to return call.  Dutch Quint RN  July 24, 2010 3:31 PM  Pt. advised of refills and reason for "delay."   Verbalized understanding and agreement.   States her last name is now Jal and records should reflect that.  Dutch Quint RN  July 24, 2010 4:24 PM

## 2011-10-20 ENCOUNTER — Emergency Department (HOSPITAL_COMMUNITY)
Admission: EM | Admit: 2011-10-20 | Discharge: 2011-10-20 | Disposition: A | Discharge status: Home / Self Care | Payer: Self-pay | Attending: Emergency Medicine | Admitting: Emergency Medicine

## 2011-10-20 ENCOUNTER — Emergency Department (HOSPITAL_COMMUNITY): Payer: Self-pay

## 2011-10-20 ENCOUNTER — Encounter (HOSPITAL_COMMUNITY): Payer: Self-pay | Admitting: Emergency Medicine

## 2011-10-20 DIAGNOSIS — M1990 Unspecified osteoarthritis, unspecified site: Secondary | ICD-10-CM

## 2011-10-20 DIAGNOSIS — I10 Essential (primary) hypertension: Secondary | ICD-10-CM | POA: Insufficient documentation

## 2011-10-20 DIAGNOSIS — M25569 Pain in unspecified knee: Secondary | ICD-10-CM | POA: Insufficient documentation

## 2011-10-20 HISTORY — DX: Essential (primary) hypertension: I10

## 2011-10-20 MED ORDER — HYDROCODONE-ACETAMINOPHEN 5-325 MG PO TABS: 1.0000 | ORAL_TABLET | Freq: Four times a day (QID) | ORAL | Status: AC | PRN

## 2011-10-20 MED ORDER — NAPROXEN 500 MG PO TABS: 500.0000 mg | ORAL_TABLET | Freq: Two times a day (BID) | ORAL | Status: AC

## 2011-10-20 NOTE — ED Notes (Signed)
Pt presenting to ed with c/o right knee pain x 2 weeks. Pt states she is able to walk on it. Pt states no swelling noted. No redness noted. Pt knee and behind her knee are not warm to touch. Pt is alert and oriented at this time

## 2011-10-20 NOTE — ED Provider Notes (Signed)
History     CSN: [id number]  Arrival date & time 10/20/11  1810   First MD Initiated Contact with Patient 10/20/11 1954      Chief Complaint  Patient presents with  . Knee Pain    (Consider location/radiation/quality/duration/timing/severity/associated sxs/prior treatment) HPI Comments: Patient with a history of hypertension presents emergency department with 2 weeks of right knee pain.  She states that pain is worsened when ambulating in the onset of symptoms began about 2 weeks ago.  She is unaware of any trauma however she has felt some popping and clicking.  Pain is located in the back of the knee and she denies any warmth erythema or swelling.  Patient denies fevers night sweats and chills, numbness or tingling of extremity.  No other complaints at this time.  Patient is a 51 y.o. female presenting with knee pain. The history is provided by the patient.  Knee Pain    Past Medical History  Diagnosis Date  . Hypertension     History reviewed. No pertinent past surgical history.  No family history on file.  History  Substance Use Topics  . Smoking status: Never Smoker   . Smokeless tobacco: Not on file  . Alcohol Use: No    OB History    Grav Para Term Preterm Abortions TAB SAB Ect Mult Living                  Review of Systems  Musculoskeletal: Positive for gait problem.  All other systems reviewed and are negative.    Allergies  Review of patient's allergies indicates no known allergies.  Home Medications   Current Outpatient Rx  Name Route Sig Dispense Refill  . LISINOPRIL-HYDROCHLOROTHIAZIDE 20-12.5 MG PO TABS Oral Take 1 tablet by mouth daily.    Marland Kitchen NAPROXEN SODIUM 220 MG PO TABS Oral Take 220 mg by mouth every 12 (twelve) hours as needed. Pain      BP 142/106  Pulse 91  Temp(Src) 98.6 F (37 C) (Oral)  Resp 18  SpO2 98%  Physical Exam  Nursing note and vitals reviewed. Constitutional: She is oriented to person, place, and time. She appears  well-developed and well-nourished. No distress.  HENT:  Head: Normocephalic and atraumatic.  Eyes: Conjunctivae and EOM are normal.  Neck: Normal range of motion.  Pulmonary/Chest: Effort normal.  Musculoskeletal: Normal range of motion.       Tenderness to palpation over medial and lateral aspects of knee.  Strength, sensory, and distal pulses intact  Neurological: She is alert and oriented to person, place, and time.  Skin: Skin is warm and dry. No rash noted. She is not diaphoretic.       No warmth, erythema, or purpura.  Psychiatric: She has a normal mood and affect. Her behavior is normal.    ED Course  Procedures (including critical care time)  Labs Reviewed - No data to display No results found.   No diagnosis found.    MDM  Knee pain, degenerative disease  Patient X-Ray negative for obvious fracture or dislocation. Pain managed in ED. Pt advised to follow up with orthopedics if symptoms persist for possibility of missed fracture diagnosis. Patient given brace while in ED, conservative therapy recommended and discussed. Patient will be dc home & is agreeable with above plan.         Jaci Carrel, [place] 10/20/11 2104

## 2011-10-20 NOTE — Discharge Instructions (Signed)
Degenerative Arthritis  You have osteoarthritis. This is the wear and tear arthritis that comes with aging. It is also called degenerative arthritis. This is common in people past middle age. It is caused by stress on the joints. The large weight bearing joints of the lower extremities are most often affected. The knees, hips, back, neck, and hands can become painful, swollen, and stiff. This is the most common type of arthritis. It comes on with age, carrying too much weight, or from an injury.  Treatment includes resting the sore joint until the pain and swelling improve. Crutches or a walker may be needed for severe flares. Only take over-the-counter or prescription medicines for pain, discomfort, or fever as directed by your caregiver. Local heat therapy may improve motion. Cortisone shots into the joint are sometimes used to reduce pain and swelling during flares.  Osteoarthritis is usually not crippling and progresses slowly. There are things you can do to decrease pain:  · Avoid high impact activities.  · Exercise regularly.  · Low impact exercises such as walking, biking and swimming help to keep the muscles strong and keep normal joint function.  · Stretching helps to keep your range of motion.  · Lose weight if you are overweight. This reduces joint stress.  In severe cases when you have pain at rest or increasing disability, joint surgery may be helpful. See your caregiver for follow-up treatment as recommended.   SEEK IMMEDIATE MEDICAL CARE IF:   · You have severe joint pain.  · Marked swelling and redness in your joint develops.  · You develop a high fever.  Document Released: 05/06/2005 Document Revised: 04/25/2011 Document Reviewed: 10/06/2006  ExitCare® Patient Information ©2012 [employer].

## 2011-10-20 NOTE — ED Notes (Signed)
Pt return from xray.

## 2011-10-20 NOTE — ED Notes (Signed)
Pt denies injury states chronic pain. No redness or swelling noted.

## 2011-10-21 NOTE — ED Provider Notes (Signed)
Medical screening examination/treatment/procedure(s) were performed by non-physician practitioner and as supervising physician I was immediately available for consultation/collaboration.  Deion Forgue, MD 10/21/11 0917 

## 2013-02-02 ENCOUNTER — Ambulatory Visit: Payer: No Typology Code available for payment source | Attending: Family Medicine | Admitting: Internal Medicine

## 2013-02-02 ENCOUNTER — Encounter: Payer: Self-pay | Admitting: Internal Medicine

## 2013-02-02 VITALS — BP 157/99 | HR 102 | Temp 98.4°F | Resp 20 | Ht 64.0 in | Wt 236.0 lb

## 2013-02-02 DIAGNOSIS — I10 Essential (primary) hypertension: Secondary | ICD-10-CM | POA: Insufficient documentation

## 2013-02-02 DIAGNOSIS — R05 Cough: Secondary | ICD-10-CM | POA: Insufficient documentation

## 2013-02-02 DIAGNOSIS — R059 Cough, unspecified: Secondary | ICD-10-CM | POA: Insufficient documentation

## 2013-02-02 DIAGNOSIS — J069 Acute upper respiratory infection, unspecified: Secondary | ICD-10-CM

## 2013-02-02 MED ORDER — LISINOPRIL-HYDROCHLOROTHIAZIDE 20-25 MG PO TABS: 1.0000 | ORAL_TABLET | Freq: Every day | ORAL | Status: DC

## 2013-02-02 MED ORDER — AZITHROMYCIN 500 MG PO TABS: 500.0000 mg | ORAL_TABLET | Freq: Every day | ORAL | Status: DC

## 2013-02-02 MED ORDER — CLONIDINE HCL 0.1 MG PO TABS
0.1000 mg | ORAL_TABLET | Freq: Once | ORAL | Status: AC
  Administered 2013-02-02: 0.1 mg via ORAL

## 2013-02-02 MED ORDER — GUAIFENESIN-DM 100-10 MG/5ML PO SYRP: 10.0000 mL | ORAL_SOLUTION | Freq: Three times a day (TID) | ORAL | Status: DC | PRN

## 2013-02-02 NOTE — Patient Instructions (Signed)
Upper Respiratory Infection, Adult An upper respiratory infection (URI) is also known as the common cold. It is often caused by a type of germ (virus). Colds are easily spread (contagious). You can pass it to others by kissing, coughing, sneezing, or drinking out of the same glass. Usually, you get better in 1 or 2 weeks.  HOME CARE   Only take medicine as told by your doctor.  Use a warm mist humidifier or breathe in steam from a hot shower.  Drink enough water and fluids to keep your pee (urine) clear or pale yellow.  Get plenty of rest.  Return to work when your temperature is back to normal or as told by your doctor. You may use a face mask and wash your hands to stop your cold from spreading. GET HELP RIGHT AWAY IF:   After the first few days, you feel you are getting worse.  You have questions about your medicine.  You have chills, shortness of breath, or brown or red spit (mucus).  You have yellow or brown snot (nasal discharge) or pain in the face, especially when you bend forward.  You have a fever, puffy (swollen) neck, pain when you swallow, or white spots in the back of your throat.  You have a bad headache, ear pain, sinus pain, or chest pain.  You have a high-pitched whistling sound when you breathe in and out (wheezing).  You have a lasting cough or cough up blood.  You have sore muscles or a stiff neck. MAKE SURE YOU:   Understand these instructions.  Will watch your condition.  Will get help right away if you are not doing well or get worse. Document Released: 10/23/2007 Document Revised: 07/29/2011 Document Reviewed: 09/10/2010 ExitCare Patient Information 2014 [employer]. Hypertension As your heart beats, it forces blood through your arteries. This force is your blood pressure. If the pressure is too high, it is called hypertension (HTN) or high blood pressure. HTN is dangerous because you may have it and not know it. High blood pressure may mean that  your heart has to work harder to pump blood. Your arteries may be narrow or stiff. The extra work puts you at risk for heart disease, stroke, and other problems.  Blood pressure consists of two numbers, a higher number over a lower, 110/72, for example. It is stated as "110 over 72." The ideal is below 120 for the top number (systolic) and under 80 for the bottom (diastolic). Write down your blood pressure today. You should pay close attention to your blood pressure if you have certain conditions such as:  Heart failure.  Prior heart attack.  Diabetes  Chronic kidney disease.  Prior stroke.  Multiple risk factors for heart disease. To see if you have HTN, your blood pressure should be measured while you are seated with your arm held at the level of the heart. It should be measured at least twice. A one-time elevated blood pressure reading (especially in the Emergency Department) does not mean that you need treatment. There may be conditions in which the blood pressure is different between your right and left arms. It is important to see your caregiver soon for a recheck. Most people have essential hypertension which means that there is not a specific cause. This type of high blood pressure may be lowered by changing lifestyle factors such as:  Stress.  Smoking.  Lack of exercise.  Excessive weight.  Drug/tobacco/alcohol use.  Eating less salt. Most people do not   have symptoms from high blood pressure until it has caused damage to the body. Effective treatment can often prevent, delay or reduce that damage. TREATMENT  When a cause has been identified, treatment for high blood pressure is directed at the cause. There are a large number of medications to treat HTN. These fall into several categories, and your caregiver will help you select the medicines that are best for you. Medications may have side effects. You should review side effects with your caregiver. If your blood pressure  stays high after you have made lifestyle changes or started on medicines,   Your medication(s) may need to be changed.  Other problems may need to be addressed.  Be certain you understand your prescriptions, and know how and when to take your medicine.  Be sure to follow up with your caregiver within the time frame advised (usually within two weeks) to have your blood pressure rechecked and to review your medications.  If you are taking more than one medicine to lower your blood pressure, make sure you know how and at what times they should be taken. Taking two medicines at the same time can result in blood pressure that is too low. SEEK IMMEDIATE MEDICAL CARE IF:  You develop a severe headache, blurred or changing vision, or confusion.  You have unusual weakness or numbness, or a faint feeling.  You have severe chest or abdominal pain, vomiting, or breathing problems. MAKE SURE YOU:   Understand these instructions.  Will watch your condition.  Will get help right away if you are not doing well or get worse. Document Released: 05/06/2005 Document Revised: 07/29/2011 Document Reviewed: 12/25/2007 ExitCare Patient Information 2014 [employer].  

## 2013-02-02 NOTE — Progress Notes (Signed)
Patient ID: Holly Hendrix, female   DOB: April 22, 1961, 53 y.o.   MRN: [id number] Patient Demographics  Holly Hendrix, is a 52 y.o. female  MVH:[id number]  [id number]:[id number]  DOB - 1960/07/04  CC:  Chief Complaint  Patient presents with  . Establish Care  . Cough  . Hypertension       HPI: Holly Hendrix is a 52 y.o. female here today to establish medical care. She has past medical history of hypertension but has not been taking her medication for the past one year because she lost her job and couldn't afford medication anymore. Her major complaint today include a cough and runny nose for the past one week, cough is now productive of yellowish sputum,  occasionally brown. She denies shortness of breath, no wheezing no chest pain. She has not taken any medication. No history of diabetes or any other medical disease, her mother died when patient was 14 years old, not sure of cause of death, a sister has diabetes mellitus. No known family history of cancer. Patient's last mammogram was a few years ago, as well as Pap smear. She has not had colonoscopy. She does not smoke cigarette, she does not drink alcohol. He  Patient has No headache, No chest pain, No abdominal pain - No Nausea, No new weakness tingling or numbness, No Cough - SOB.  No Known Allergies Past Medical History  Diagnosis Date  . Hypertension    Current Outpatient Prescriptions on File Prior to Visit  Medication Sig Dispense Refill  . naproxen sodium (ANAPROX) 220 MG tablet Take 220 mg by mouth every 12 (twelve) hours as needed. Pain       No current facility-administered medications on file prior to visit.   Family History  Problem Relation Age of Onset  . Arthritis Sister   . Diabetes Sister   . Hypertension Sister    History   Social History  . Marital Status: Legally Separated    Spouse Name: N/A    Number of Children: N/A  . Years of Education: N/A   Occupational History  . Not on file.   Social History Main  Topics  . Smoking status: Never Smoker   . Smokeless tobacco: Not on file  . Alcohol Use: No  . Drug Use: No  . Sexual Activity:    Other Topics Concern  . Not on file   Social History Narrative  . No narrative on file    Review of Systems: Constitutional: Negative for fever, chills, diaphoresis, activity change, appetite change and fatigue. HENT: Negative for ear pain, nosebleeds, congestion, facial swelling, neck pain, neck stiffness and ear discharge.  Eyes: Negative for pain, discharge, redness, itching and visual disturbance. Respiratory: ++cough, no choking, chest tightness, shortness of breath, wheezing and stridor.  Cardiovascular: Negative for chest pain, palpitations and leg swelling. Gastrointestinal: Negative for abdominal distention. Genitourinary: Negative for dysuria, urgency, frequency, hematuria, flank pain, decreased urine volume, difficulty urinating and dyspareunia.  Musculoskeletal: Negative for back pain, joint swelling, arthralgia and gait problem. Neurological: Negative for dizziness, tremors, seizures, syncope, facial asymmetry, speech difficulty, weakness, light-headedness, numbness and headaches.  Hematological: Negative for adenopathy. Does not bruise/bleed easily. Psychiatric/Behavioral: Negative for hallucinations, behavioral problems, confusion, dysphoric mood, decreased concentration and agitation.    Objective:   Filed Vitals:   02/02/13 1059  BP: 175/124  Pulse: 102  Temp: 98.4 F (36.9 C)  Resp: 20    Physical Exam: Constitutional: Patient appears well-developed and well-nourished. No distress. Obese HENT: Normocephalic, atraumatic, External  right and left ear normal. Oropharynx is clear and moist.  Eyes: Conjunctivae and EOM are normal. PERRLA, no scleral icterus. Neck: Normal ROM. Neck supple. No JVD. No tracheal deviation. No thyromegaly. CVS: RRR, S1/S2 +, no murmurs, no gallops, no carotid bruit.  Pulmonary: Effort and breath sounds  normal, no stridor, rhonchi, wheezes, rales.  Abdominal: Soft. BS +, no distension, tenderness, rebound or guarding.  Musculoskeletal: Normal range of motion. No edema and no tenderness.  Lymphadenopathy: No lymphadenopathy noted, cervical, inguinal or axillary Neuro: Alert. Normal reflexes, muscle tone coordination. No cranial nerve deficit. Skin: Skin is warm and dry. No rash noted. Not diaphoretic. No erythema. No pallor. Psychiatric: Normal mood and affect. Behavior, judgment, thought content normal.  Lab Results  Component Value Date   WBC 6.7 10/05/2009   HGB 13.2 10/05/2009   HCT 40.7 10/05/2009   MCV 79.2 10/05/2009   PLT 281 10/05/2009   Lab Results  Component Value Date   CREATININE 0.75 10/05/2009   BUN 14 10/05/2009   NA 140 10/05/2009   K 3.5 10/05/2009   CL 102 10/05/2009   CO2 28 10/05/2009    No results found for this basename: HGBA1C   Lipid Panel     Component Value Date/Time   CHOL 146 10/05/2009 2142   TRIG 76 10/05/2009 2142   HDL 39* 10/05/2009 2142   CHOLHDL 3.7 Ratio 10/05/2009 2142   VLDL 15 10/05/2009 2142   LDLCALC 92 10/05/2009 2142       Assessment and plan:   Patient Active Problem List   Diagnosis Date Noted  . Upper respiratory tract infection 02/02/2013  . HTN (hypertension) 02/02/2013  . ACUTE NASOPHARYNGITIS 07/02/2010  . DENTAL CARIES 10/05/2009  . INCONTINENCE, URGE 08/02/2009  . OBESITY 11/10/2008  . HYPERTENSION, BENIGN ESSENTIAL 06/06/2008  . ALLERGIC RHINITIS 06/06/2008    Plan: Give clonidine 0.1 mg tablet now in the clinic, repeat blood pressure a half an hour  Zithromax tablet 500 mg daily for 3 days Robitussin 10 mls 3 times a day Lisinopril-hydrochlorothiazide 20-25 mg tablet by mouth daily Patient has been extensively counseled about hypertension and its control  We have discussed blood pressure goal today, patient encouraged to record ambulatory blood pressure and bring to next appointment Patient has been counseled about  nutrition and exercise  After 30 minutes of clonidine 0.1 mg by mouth once, patient's blood pressure is still 180/125 mm Hg.  No symptoms of accelerated hypertension, will repeat clonidine 0.1 mg by mouth again, repeat blood pressure in 30 minutes. Patient counseled  Repeat blood pressure is 157/99. Patient has no symptoms.      Health Maintenance -Colonoscopy: Will schedule -Pap Smear: Will schedule -Mammogram: Ordered -Vaccinations:  -TdAP  -PNA (PPSV23) (one dose after 65) (or one dose before 65 if chronic conditions)  -Zoster (1 dose after 60 yrs)  -Influenza: Patient prefers to have it done later because of URI  Follow up in  2 weeks for blood pressure check   The patient was given clear instructions to go to ER or return to medical center if symptoms don't improve, worsen or new problems develop. The patient verbalized understanding. The patient was told to call to get lab results if they haven't heard anything in the next week.   Jeanann Lewandowsky, MD, MHA, FACP [facility] [place], [place] [phone]   02/02/2013, 11:26 AM

## 2013-02-02 NOTE — Progress Notes (Signed)
Pt here to establish care  Hx uncontrolled HTN- hasn't taken meds in 1 yr due to job lost C/o constant nonproductive cough with yellow phlegm x 1 week. Denies sob or pain Afebrile Taking alka seltzer

## 2013-02-03 LAB — CBC WITH DIFFERENTIAL/PLATELET
Eosinophils Absolute: 0.3 10*3/uL (ref 0.0–0.7)
Eosinophils Relative: 4 % (ref 0–5)
Hemoglobin: 15 g/dL (ref 12.0–15.0)
MCH: 25.3 pg — ABNORMAL LOW (ref 26.0–34.0)
MCHC: 33.6 g/dL (ref 30.0–36.0)
MCV: 75.5 fL — ABNORMAL LOW (ref 78.0–100.0)
Monocytes Absolute: 0.4 10*3/uL (ref 0.1–1.0)
Neutro Abs: 4.1 10*3/uL (ref 1.7–7.7)
Neutrophils Relative %: 49 % (ref 43–77)
RBC: 5.92 MIL/uL — ABNORMAL HIGH (ref 3.87–5.11)
RDW: 16.4 % — ABNORMAL HIGH (ref 11.5–15.5)

## 2013-02-03 LAB — CMP AND LIVER
Albumin: 4 g/dL (ref 3.5–5.2)
Alkaline Phosphatase: 99 U/L (ref 39–117)
CO2: 24 mEq/L (ref 19–32)
Calcium: 9.3 mg/dL (ref 8.4–10.5)
Chloride: 105 mEq/L (ref 96–112)
Creat: 0.67 mg/dL (ref 0.50–1.10)
Glucose, Bld: 121 mg/dL — ABNORMAL HIGH (ref 70–99)
Potassium: 3.7 mEq/L (ref 3.5–5.3)
Total Protein: 7.5 g/dL (ref 6.0–8.3)

## 2013-02-03 LAB — URINALYSIS, COMPLETE
Bilirubin Urine: NEGATIVE
Hgb urine dipstick: NEGATIVE
Ketones, ur: NEGATIVE mg/dL
Nitrite: NEGATIVE
Protein, ur: NEGATIVE mg/dL
Urobilinogen, UA: 0.2 mg/dL (ref 0.0–1.0)

## 2013-02-03 LAB — TSH: TSH: 1.525 u[IU]/mL (ref 0.350–4.500)

## 2013-02-03 LAB — LIPID PANEL: Total CHOL/HDL Ratio: 5.6 Ratio

## 2013-02-03 LAB — RETICULOCYTES: RBC.: 5.92 MIL/uL — ABNORMAL HIGH (ref 3.87–5.11)

## 2013-02-16 ENCOUNTER — Ambulatory Visit: Payer: No Typology Code available for payment source | Attending: Internal Medicine

## 2013-02-16 NOTE — Progress Notes (Unsigned)
  Subjective:    Patient ID: Holly Hendrix, female    DOB: [birth year], 52 y.o.   MRN: [id number]  HPI    Review of Systems     Objective:   Physical Exam        Assessment & Plan:  PT HERE FOR BP RECHECK BP 159/104  PT INSTRUCTED TO RETURN IN 1 WEEK FOR RECHECK AND POSS MEDICATION

## 2013-02-22 ENCOUNTER — Telehealth: Payer: Self-pay | Admitting: Emergency Medicine

## 2013-02-22 NOTE — Telephone Encounter (Signed)
Left message for pt to schedule repeat cholesterol level

## 2013-02-22 NOTE — Telephone Encounter (Signed)
Message copied by Darlis Loan on Mon Feb 22, 2013 12:53 PM ------      Message from: Quentin Angst      Created: Mon Feb 22, 2013 11:33 AM       Please tell patient that her lab results are mostly within normal limits except for her cholesterol which is very high. For now we want her to adhere strictly with nutritional control and exercise. Low fat diet, more vegetable and protein diet, regular exercise at least 3 times a week 30 minutes each time, we will repeat the labs in about 3 months if still high will have to start medication ------

## 2013-02-23 ENCOUNTER — Ambulatory Visit (HOSPITAL_COMMUNITY): Payer: No Typology Code available for payment source

## 2013-02-24 ENCOUNTER — Ambulatory Visit: Payer: No Typology Code available for payment source

## 2013-03-04 ENCOUNTER — Ambulatory Visit (HOSPITAL_COMMUNITY)
Admission: RE | Admit: 2013-03-04 | Discharge: 2013-03-04 | Disposition: A | Discharge status: Home / Self Care | Payer: No Typology Code available for payment source | Source: Ambulatory Visit | Attending: Internal Medicine | Admitting: Internal Medicine

## 2013-03-04 DIAGNOSIS — Z1231 Encounter for screening mammogram for malignant neoplasm of breast: Secondary | ICD-10-CM | POA: Insufficient documentation

## 2013-03-04 DIAGNOSIS — I10 Essential (primary) hypertension: Secondary | ICD-10-CM

## 2013-03-05 ENCOUNTER — Other Ambulatory Visit: Payer: Self-pay | Admitting: Internal Medicine

## 2013-03-05 ENCOUNTER — Telehealth: Payer: Self-pay

## 2013-03-05 NOTE — Telephone Encounter (Signed)
Message copied by Lestine Mount on Fri Mar 05, 2013 10:43 AM ------      Message from: Quentin Angst      Created: Fri Mar 05, 2013  9:58 AM       Please inform patient that we received message from radiology saying she needed diagnostic mammogram of the left breast. Please inform patient to contact the radiology department to schedule this immediately. ------

## 2013-03-05 NOTE — Telephone Encounter (Signed)
Patient is aware of her mamogram results and [facility] test She needs

## 2013-03-10 ENCOUNTER — Other Ambulatory Visit: Payer: Self-pay | Admitting: Internal Medicine

## 2013-03-10 DIAGNOSIS — R928 Other abnormal and inconclusive findings on diagnostic imaging of breast: Secondary | ICD-10-CM

## 2013-03-24 ENCOUNTER — Ambulatory Visit
Admission: RE | Admit: 2013-03-24 | Discharge: 2013-03-24 | Disposition: A | Discharge status: Home / Self Care | Payer: No Typology Code available for payment source | Source: Ambulatory Visit | Attending: Internal Medicine | Admitting: Internal Medicine

## 2013-03-24 DIAGNOSIS — R928 Other abnormal and inconclusive findings on diagnostic imaging of breast: Secondary | ICD-10-CM

## 2013-03-26 ENCOUNTER — Telehealth: Payer: Self-pay | Admitting: Emergency Medicine

## 2013-03-26 ENCOUNTER — Telehealth: Payer: Self-pay

## 2013-03-26 NOTE — Telephone Encounter (Signed)
Left message with negative mammogram results

## 2013-03-26 NOTE — Telephone Encounter (Signed)
Message copied by Lestine Mount on Fri Mar 26, 2013  2:16 PM ------      Message from: Quentin Angst      Created: Fri Mar 26, 2013 12:19 PM       Please inform patient that her mammogram is within normal limit. Recommend yearly followup screening ------

## 2013-03-26 NOTE — Telephone Encounter (Signed)
Message copied by Darlis Loan on Fri Mar 26, 2013  2:12 PM ------      Message from: Quentin Angst      Created: Fri Mar 26, 2013 12:19 PM       Please inform patient that her mammogram is within normal limit. Recommend yearly followup screening ------

## 2013-03-26 NOTE — Telephone Encounter (Signed)
Left message on machine that her mamogram Was in normal limits

## 2013-04-22 ENCOUNTER — Telehealth: Payer: Self-pay | Admitting: Internal Medicine

## 2013-04-22 DIAGNOSIS — I10 Essential (primary) hypertension: Secondary | ICD-10-CM

## 2013-04-22 MED ORDER — LISINOPRIL-HYDROCHLOROTHIAZIDE 20-25 MG PO TABS: 1.0000 | ORAL_TABLET | Freq: Every day | ORAL | Status: DC

## 2013-04-22 NOTE — Telephone Encounter (Signed)
BP medication refilled and sent to [facility]. Pt told to pick up

## 2013-04-22 NOTE — Telephone Encounter (Signed)
Pt called regarding a refill of her blood pressure medication, Pt has an appointment on 04/30/2013 and she would like a refill from today until her appointment date because she is completely out. Please contact pt

## 2013-04-30 ENCOUNTER — Ambulatory Visit: Payer: No Typology Code available for payment source

## 2013-04-30 ENCOUNTER — Encounter: Payer: Self-pay | Admitting: Internal Medicine

## 2013-04-30 ENCOUNTER — Ambulatory Visit: Payer: No Typology Code available for payment source | Attending: Internal Medicine | Admitting: Internal Medicine

## 2013-04-30 VITALS — BP 133/81 | HR 82 | Temp 98.4°F | Resp 14 | Ht 64.0 in | Wt 236.2 lb

## 2013-04-30 DIAGNOSIS — E785 Hyperlipidemia, unspecified: Secondary | ICD-10-CM

## 2013-04-30 DIAGNOSIS — I10 Essential (primary) hypertension: Secondary | ICD-10-CM | POA: Insufficient documentation

## 2013-04-30 LAB — LIPID PANEL
HDL: 34 mg/dL — ABNORMAL LOW (ref >39)
LDL Cholesterol: 128 mg/dL — ABNORMAL HIGH (ref 0–99)
Triglycerides: 79 mg/dL (ref <150)
VLDL: 16 mg/dL (ref 0–40)

## 2013-04-30 LAB — COMPLETE METABOLIC PANEL WITH GFR
ALT: 21 U/L (ref 0–35)
AST: 15 U/L (ref 0–37)
CO2: 30 mEq/L (ref 19–32)
Calcium: 9.3 mg/dL (ref 8.4–10.5)
Chloride: 98 mEq/L (ref 96–112)
Creat: 0.78 mg/dL (ref 0.50–1.10)
GFR, Est African American: >89 mL/min
Potassium: 3.6 mEq/L (ref 3.5–5.3)
Sodium: 135 mEq/L (ref 135–145)
Total Bilirubin: 0.6 mg/dL (ref 0.3–1.2)
Total Protein: 7.2 g/dL (ref 6.0–8.3)

## 2013-04-30 MED ORDER — ASPIRIN EC 81 MG PO TBEC: 81.0000 mg | DELAYED_RELEASE_TABLET | Freq: Every day | ORAL | Status: DC

## 2013-04-30 MED ORDER — ROSUVASTATIN CALCIUM 10 MG PO TABS: 10.0000 mg | ORAL_TABLET | Freq: Every day | ORAL | Status: DC

## 2013-04-30 NOTE — Progress Notes (Signed)
Patient ID: Holly Hendrix, female   DOB: 09/11/1960, 52 y.o.   MRN: [id number]   CC:  HPI: 52 year old female with a history of hypertension, here for a followup. She had labs done during her last visit in September and her LDL was 160. She denies any chest pain any shortness of breath. She recently had her antihypertensive medication filled. Blood pressures been stable    No Known Allergies Past Medical History  Diagnosis Date  . Hypertension    Current Outpatient Prescriptions on File Prior to Visit  Medication Sig Dispense Refill  . lisinopril-hydrochlorothiazide (PRINZIDE,ZESTORETIC) 20-25 MG per tablet Take 1 tablet by mouth daily.  90 tablet  3  . azithromycin (ZITHROMAX) 500 MG tablet Take 1 tablet (500 mg total) by mouth daily.  5 tablet  0  . guaiFENesin-dextromethorphan (ROBITUSSIN DM) 100-10 MG/5ML syrup Take 10 mLs by mouth 3 (three) times daily as needed for cough.  118 mL  0  . naproxen sodium (ANAPROX) 220 MG tablet Take 220 mg by mouth every 12 (twelve) hours as needed. Pain       No current facility-administered medications on file prior to visit.   Family History  Problem Relation Age of Onset  . Arthritis Sister   . Diabetes Sister   . Hypertension Sister    History   Social History  . Marital Status: Legally Separated    Spouse Name: N/A    Number of Children: N/A  . Years of Education: N/A   Occupational History  . Not on file.   Social History Main Topics  . Smoking status: Never Smoker   . Smokeless tobacco: Not on file  . Alcohol Use: No  . Drug Use: No  . Sexual Activity:    Other Topics Concern  . Not on file   Social History Narrative  . No narrative on file    Review of Systems  Constitutional: Negative for fever, chills, diaphoresis, activity change, appetite change and fatigue.  HENT: Negative for ear pain, nosebleeds, congestion, facial swelling, rhinorrhea, neck pain, neck stiffness and ear discharge.   Eyes: Negative for pain,  discharge, redness, itching and visual disturbance.  Respiratory: Negative for cough, choking, chest tightness, shortness of breath, wheezing and stridor.   Cardiovascular: Negative for chest pain, palpitations and leg swelling.  Gastrointestinal: Negative for abdominal distention.  Genitourinary: Negative for dysuria, urgency, frequency, hematuria, flank pain, decreased urine volume, difficulty urinating and dyspareunia.  Musculoskeletal: Negative for back pain, joint swelling, arthralgias and gait problem.  Neurological: Negative for dizziness, tremors, seizures, syncope, facial asymmetry, speech difficulty, weakness, light-headedness, numbness and headaches.  Hematological: Negative for adenopathy. Does not bruise/bleed easily.  Psychiatric/Behavioral: Negative for hallucinations, behavioral problems, confusion, dysphoric mood, decreased concentration and agitation.    Objective:   Filed Vitals:   04/30/13 1006  BP: 133/81  Pulse: 82  Temp: 98.4 F (36.9 C)  Resp: 14    Physical Exam  Constitutional: Appears well-developed and well-nourished. No distress.  HENT: Normocephalic. External right and left ear normal. Oropharynx is clear and moist.  Eyes: Conjunctivae and EOM are normal. PERRLA, no scleral icterus.  Neck: Normal ROM. Neck supple. No JVD. No tracheal deviation. No thyromegaly.  CVS: RRR, S1/S2 +, no murmurs, no gallops, no carotid bruit.  Pulmonary: Effort and breath sounds normal, no stridor, rhonchi, wheezes, rales.  Abdominal: Soft. BS +,  no distension, tenderness, rebound or guarding.  Musculoskeletal: Normal range of motion. No edema and no tenderness.  Lymphadenopathy: No lymphadenopathy noted,  cervical, inguinal. Neuro: Alert. Normal reflexes, muscle tone coordination. No cranial nerve deficit. Skin: Skin is warm and dry. No rash noted. Not diaphoretic. No erythema. No pallor.  Psychiatric: Normal mood and affect. Behavior, judgment, thought content normal.    Lab Results  Component Value Date   WBC 8.0 02/02/2013   HGB 15.0 02/02/2013   HCT 44.7 02/02/2013   MCV 75.5* 02/02/2013   PLT 270 02/02/2013   Lab Results  Component Value Date   CREATININE 0.67 02/02/2013   BUN 10 02/02/2013   NA 137 02/02/2013   K 3.7 02/02/2013   CL 105 02/02/2013   CO2 24 02/02/2013    No results found for this basename: HGBA1C   Lipid Panel     Component Value Date/Time   CHOL 222* 02/02/2013 1129   TRIG 110 02/02/2013 1129   HDL 40 02/02/2013 1129   CHOLHDL 5.6 02/02/2013 1129   VLDL 22 02/02/2013 1129   LDLCALC 160* 02/02/2013 1129       Assessment and plan:   Patient Active Problem List   Diagnosis Date Noted  . Upper respiratory tract infection 02/02/2013  . HTN (hypertension) 02/02/2013  . ACUTE NASOPHARYNGITIS 07/02/2010  . DENTAL CARIES 10/05/2009  . INCONTINENCE, URGE 08/02/2009  . OBESITY 11/10/2008  . HYPERTENSION, BENIGN ESSENTIAL 06/06/2008  . ALLERGIC RHINITIS 06/06/2008       Dyslipidemia Start the patient on Crestor 10 mg Repeat lipid panel today Discussed side effects of Crestor with the patient Recommend baby aspirin 81 mg a day   Hypertension stable  Followup in 3 months  The patient was given clear instructions to go to ER or return to medical center if symptoms don't improve, worsen or new problems develop. The patient verbalized understanding. The patient was told to call to get any lab results if not heard anything in the next week.

## 2013-04-30 NOTE — Progress Notes (Signed)
Pt is here for a medication refill and lab work. Pt is unsure of which labs, may be cholesterol.

## 2013-05-01 IMAGING — CR DG KNEE COMPLETE 4+V*R*
5 series · 5 of 5 positions shown · non-contrast
Comparison: None.

CLINICAL DATA: Knee pain.

RIGHT KNEE - COMPLETE 4+ VIEW

[t knee ap right]
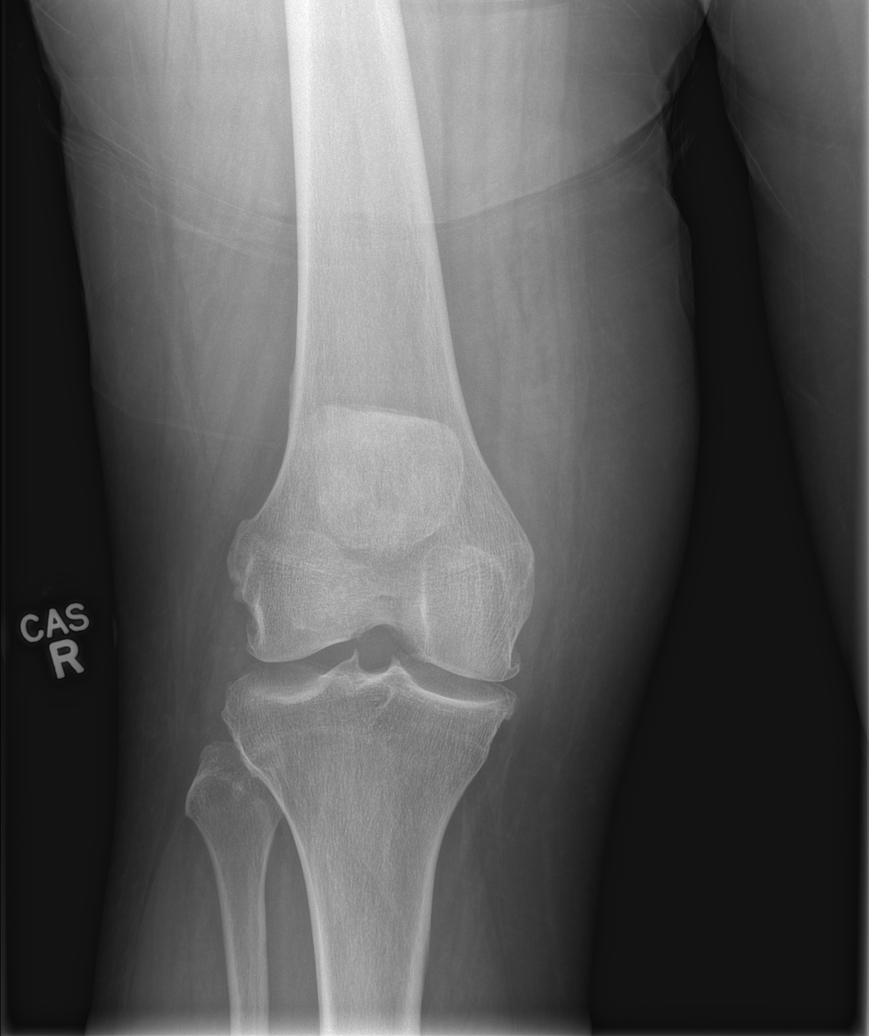

[t knee obl right (1 of 2)]
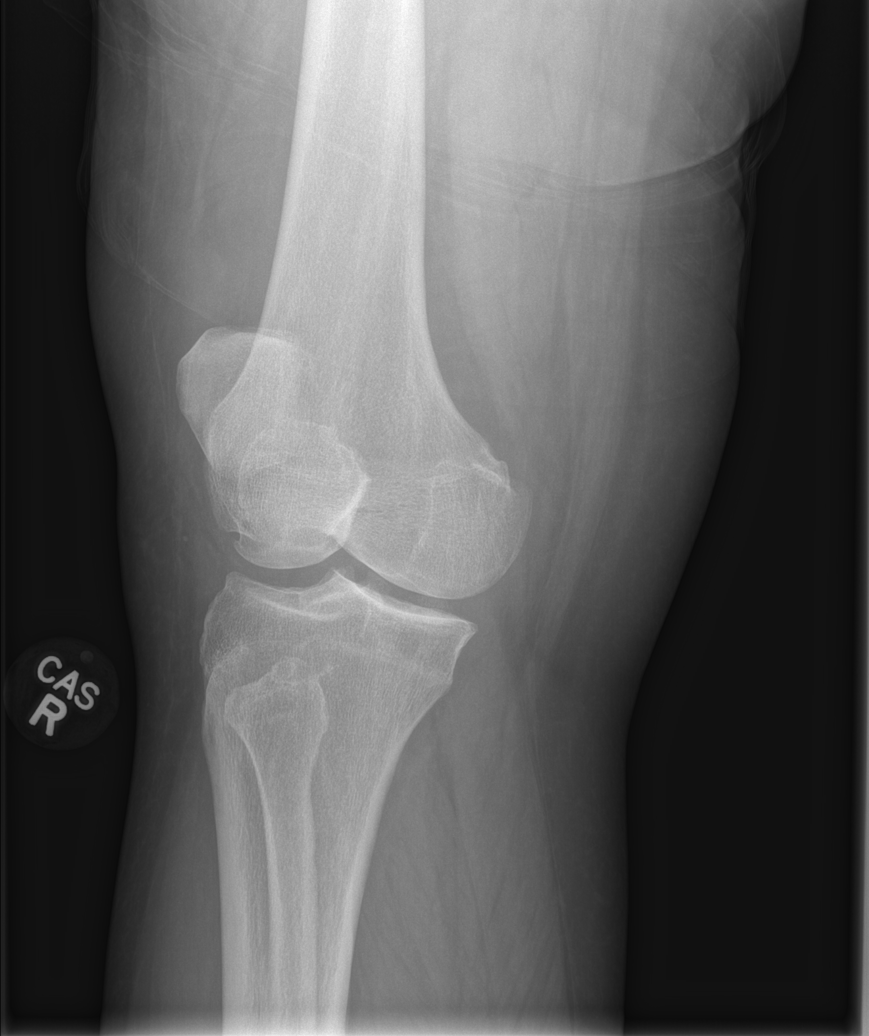

[t knee obl right (2 of 2)]
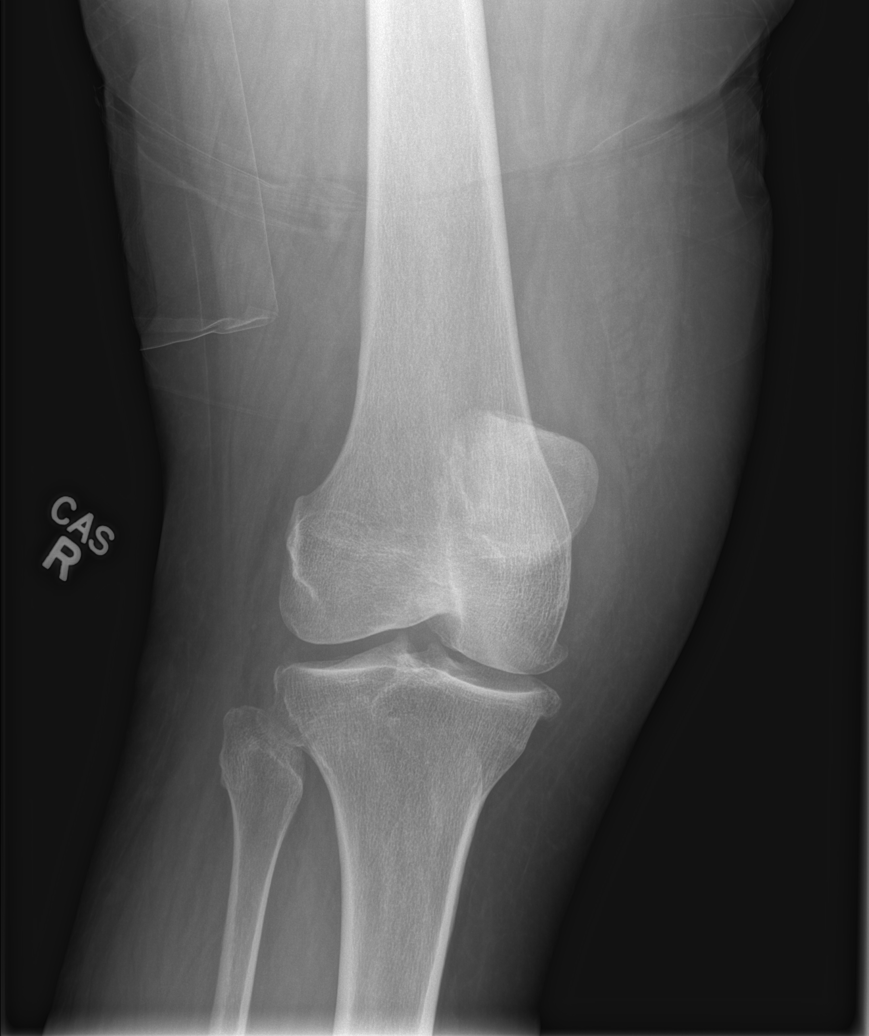

[t knee lat right (1 of 2)]
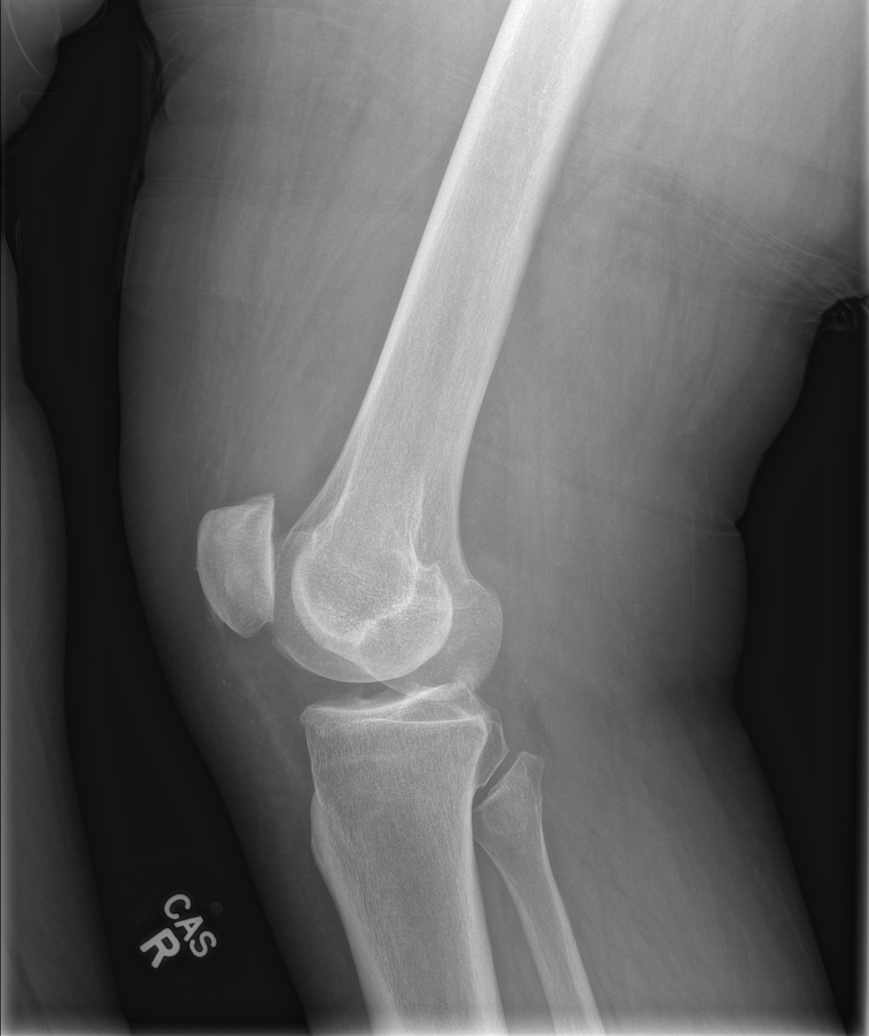

[t knee lat right (2 of 2)]
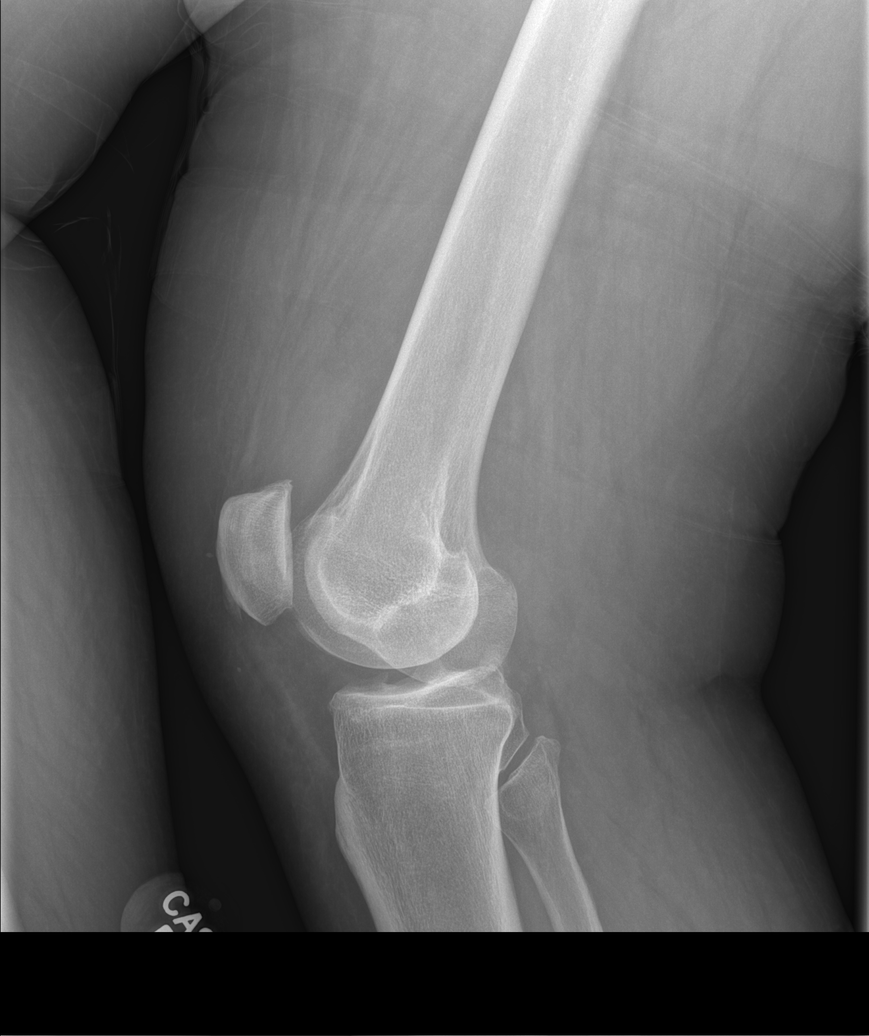

[5 of 5 positions shown; findings below may reference images not displayed]

FINDINGS: There is no acute bony or joint abnormality.
Degenerative disease about the knee appears most notable in the
medial compartment.  Small joint effusion noted.
IMPRESSION: No acute finding.  Degenerative disease.

## 2013-05-03 ENCOUNTER — Telehealth: Payer: Self-pay | Admitting: *Deleted

## 2013-05-03 NOTE — Telephone Encounter (Signed)
Left a voicemail to notify patient that all of her lab results came back normal.

## 2013-06-15 ENCOUNTER — Ambulatory Visit: Payer: No Typology Code available for payment source | Attending: Internal Medicine

## 2013-07-29 ENCOUNTER — Encounter: Payer: Self-pay | Admitting: Internal Medicine

## 2013-07-29 ENCOUNTER — Ambulatory Visit: Payer: No Typology Code available for payment source | Attending: Internal Medicine | Admitting: Internal Medicine

## 2013-07-29 VITALS — BP 150/90 | HR 82 | Temp 97.8°F | Resp 18 | Ht 64.0 in | Wt 235.0 lb

## 2013-07-29 DIAGNOSIS — E1169 Type 2 diabetes mellitus with other specified complication: Secondary | ICD-10-CM | POA: Insufficient documentation

## 2013-07-29 DIAGNOSIS — E785 Hyperlipidemia, unspecified: Secondary | ICD-10-CM

## 2013-07-29 DIAGNOSIS — R7301 Impaired fasting glucose: Secondary | ICD-10-CM | POA: Insufficient documentation

## 2013-07-29 DIAGNOSIS — K029 Dental caries, unspecified: Secondary | ICD-10-CM

## 2013-07-29 DIAGNOSIS — I10 Essential (primary) hypertension: Secondary | ICD-10-CM

## 2013-07-29 MED ORDER — LISINOPRIL-HYDROCHLOROTHIAZIDE 20-25 MG PO TABS: 1.0000 | ORAL_TABLET | Freq: Every day | ORAL | Status: DC

## 2013-07-29 MED ORDER — CLONIDINE HCL 0.1 MG PO TABS
0.1000 mg | ORAL_TABLET | Freq: Once | ORAL | Status: AC
  Administered 2013-07-29: 0.1 mg via ORAL

## 2013-07-29 NOTE — Patient Instructions (Signed)
Fat and Cholesterol Control Diet Fat and cholesterol levels in your blood and organs are influenced by your diet. High levels of fat and cholesterol may lead to diseases of the heart, small and large blood vessels, gallbladder, liver, and pancreas. CONTROLLING FAT AND CHOLESTEROL WITH DIET Although exercise and lifestyle factors are important, your diet is key. That is because certain foods are known to raise cholesterol and others to lower it. The goal is to balance foods for their effect on cholesterol and more importantly, to replace saturated and trans fat with other types of fat, such as monounsaturated fat, polyunsaturated fat, and omega-3 fatty acids. On average, a person should consume no more than 15 to 17 g of saturated fat daily. Saturated and trans fats are considered "bad" fats, and they will raise LDL cholesterol. Saturated fats are primarily found in animal products such as meats, butter, and cream. However, that does not mean you need to give up all your favorite foods. Today, there are good tasting, low-fat, low-cholesterol substitutes for most of the things you like to eat. Choose low-fat or nonfat alternatives. Choose round or loin cuts of red meat. These types of cuts are lowest in fat and cholesterol. Chicken (without the skin), fish, veal, and ground turkey breast are great choices. Eliminate fatty meats, such as hot dogs and salami. Even shellfish have little or no saturated fat. Have a 3 oz (85 g) portion when you eat lean meat, poultry, or fish. Trans fats are also called "partially hydrogenated oils." They are oils that have been scientifically manipulated so that they are solid at room temperature resulting in a longer shelf life and improved taste and texture of foods in which they are added. Trans fats are found in stick margarine, some tub margarines, cookies, crackers, and baked goods.  When baking and cooking, oils are a great substitute for butter. The monounsaturated oils are  especially beneficial since it is believed they lower LDL and raise HDL. The oils you should avoid entirely are saturated tropical oils, such as coconut and palm.  Remember to eat a lot from food groups that are naturally free of saturated and trans fat, including fish, fruit, vegetables, beans, grains (barley, rice, couscous, bulgur wheat), and pasta (without cream sauces).  IDENTIFYING FOODS THAT LOWER FAT AND CHOLESTEROL  Soluble fiber may lower your cholesterol. This type of fiber is found in fruits such as apples, vegetables such as broccoli, potatoes, and carrots, legumes such as beans, peas, and lentils, and grains such as barley. Foods fortified with plant sterols (phytosterol) may also lower cholesterol. You should eat at least 2 g per day of these foods for a cholesterol lowering effect.  Read package labels to identify low-saturated fats, trans fat free, and low-fat foods at the supermarket. Select cheeses that have only 2 to 3 g saturated fat per ounce. Use a heart-healthy tub margarine that is free of trans fats or partially hydrogenated oil. When buying baked goods (cookies, crackers), avoid partially hydrogenated oils. Breads and muffins should be made from whole grains (whole-wheat or whole oat flour, instead of "flour" or "enriched flour"). Buy non-creamy canned soups with reduced salt and no added fats.  FOOD PREPARATION TECHNIQUES  Never deep-fry. If you must fry, either stir-fry, which uses very little fat, or use non-stick cooking sprays. When possible, broil, bake, or roast meats, and steam vegetables. Instead of putting butter or margarine on vegetables, use lemon and herbs, applesauce, and cinnamon (for squash and sweet potatoes). Use nonfat   yogurt, salsa, and low-fat dressings for salads.  LOW-SATURATED FAT / LOW-FAT FOOD SUBSTITUTES Meats / Saturated Fat (g)  Avoid: Steak, marbled (3 oz/85 g) / 11 g  Choose: Steak, lean (3 oz/85 g) / 4 g  Avoid: Hamburger (3 oz/85 g) / 7  g  Choose: Hamburger, lean (3 oz/85 g) / 5 g  Avoid: Ham (3 oz/85 g) / 6 g  Choose: Ham, lean cut (3 oz/85 g) / 2.4 g  Avoid: Chicken, with skin, dark meat (3 oz/85 g) / 4 g  Choose: Chicken, skin removed, dark meat (3 oz/85 g) / 2 g  Avoid: Chicken, with skin, light meat (3 oz/85 g) / 2.5 g  Choose: Chicken, skin removed, light meat (3 oz/85 g) / 1 g Dairy / Saturated Fat (g)  Avoid: Whole milk (1 cup) / 5 g  Choose: Low-fat milk, 2% (1 cup) / 3 g  Choose: Low-fat milk, 1% (1 cup) / 1.5 g  Choose: Skim milk (1 cup) / 0.3 g  Avoid: Hard cheese (1 oz/28 g) / 6 g  Choose: Skim milk cheese (1 oz/28 g) / 2 to 3 g  Avoid: Cottage cheese, 4% fat (1 cup) / 6.5 g  Choose: Low-fat cottage cheese, 1% fat (1 cup) / 1.5 g  Avoid: Ice cream (1 cup) / 9 g  Choose: Sherbet (1 cup) / 2.5 g  Choose: Nonfat frozen yogurt (1 cup) / 0.3 g  Choose: Frozen fruit bar / trace  Avoid: Whipped cream (1 tbs) / 3.5 g  Choose: Nondairy whipped topping (1 tbs) / 1 g Condiments / Saturated Fat (g)  Avoid: Mayonnaise (1 tbs) / 2 g  Choose: Low-fat mayonnaise (1 tbs) / 1 g  Avoid: Butter (1 tbs) / 7 g  Choose: Extra light margarine (1 tbs) / 1 g  Avoid: Coconut oil (1 tbs) / 11.8 g  Choose: Olive oil (1 tbs) / 1.8 g  Choose: Corn oil (1 tbs) / 1.7 g  Choose: Safflower oil (1 tbs) / 1.2 g  Choose: Sunflower oil (1 tbs) / 1.4 g  Choose: Soybean oil (1 tbs) / 2.4 g  Choose: Canola oil (1 tbs) / 1 g Document Released: 05/06/2005 Document Revised: 08/31/2012 Document Reviewed: 10/25/2010 Exit[employer] Patient Information 2014 [place], [place]. Diabetes Meal Planning Guide The diabetes meal planning guide is a tool to help you plan your meals and snacks. It is important for people with diabetes to manage their blood glucose (sugar) levels. Choosing the right foods and the right amounts throughout your day will help control your blood glucose. Eating right can even help you improve your blood  pressure and reach or maintain a healthy weight. CARBOHYDRATE COUNTING MADE EASY When you eat carbohydrates, they turn to sugar. This raises your blood glucose level. Counting carbohydrates can help you control this level so you feel better. When you plan your meals by counting carbohydrates, you can have more flexibility in what you eat and balance your medicine with your food intake. Carbohydrate counting simply means adding up the total amount of carbohydrate grams in your meals and snacks. Try to eat about the same amount at each meal. Foods with carbohydrates are listed below. Each portion below is 1 carbohydrate serving or 15 grams of carbohydrates. Ask your dietician how many grams of carbohydrates you should eat at each meal or snack. Grains and Starches  1 slice bread.   English muffin or hotdog/hamburger bun.   cup cold cereal (unsweetened).   cup cooked  pasta or rice.   cup starchy vegetables (corn, potatoes, peas, beans, winter squash).  1 tortilla (6 inches).   bagel.  1 waffle or pancake (size of a CD).   cup cooked cereal.  4 to 6 small crackers. *Whole grain is recommended. Fruit  1 cup fresh unsweetened berries, melon, papaya, pineapple.  1 small fresh fruit.   banana or mango.   cup fruit juice (4 oz unsweetened).   cup canned fruit in natural juice or water.  2 tbs dried fruit.  12 to 15 grapes or cherries. Milk and Yogurt  1 cup fat-free or 1% milk.  1 cup soy milk.  6 oz light yogurt with sugar-free sweetener.  6 oz low-fat soy yogurt.  6 oz plain yogurt. Vegetables  1 cup raw or  cup cooked is counted as 0 carbohydrates or a "free" food.  If you eat 3 or more servings at 1 meal, count them as 1 carbohydrate serving. Other Carbohydrates   oz chips or pretzels.   cup ice cream or frozen yogurt.   cup sherbet or sorbet.  2 inch square cake, no frosting.  1 tbs honey, sugar, jam, jelly, or syrup.  2 small cookies.  3  squares of graham crackers.  3 cups popcorn.  6 crackers.  1 cup broth-based soup.  Count 1 cup casserole or other mixed foods as 2 carbohydrate servings.  Foods with less than 20 calories in a serving may be counted as 0 carbohydrates or a "free" food. You may want to purchase a book or computer software that lists the carbohydrate gram counts of different foods. In addition, the nutrition facts panel on the labels of the foods you eat are a good source of this information. The label will tell you how big the serving size is and the total number of carbohydrate grams you will be eating per serving. Divide this number by 15 to obtain the number of carbohydrate servings in a portion. Remember, 1 carbohydrate serving equals 15 grams of carbohydrate. SERVING SIZES Measuring foods and serving sizes helps you make sure you are getting the right amount of food. The list below tells how big or small some common serving sizes are.  1 oz.........4 stacked dice.  3 oz........Marland KitchenDeck of cards.  1 tsp.......Marland KitchenTip of little finger.  1 tbs......Marland KitchenMarland KitchenThumb.  2 tbs.......Marland KitchenGolf ball.   cup......Marland KitchenHalf of a fist.  1 cup.......Marland KitchenA fist. SAMPLE DIABETES MEAL PLAN Below is a sample meal plan that includes foods from the grain and starches, dairy, vegetable, fruit, and meat groups. A dietician can individualize a meal plan to fit your calorie needs and tell you the number of servings needed from each food group. However, controlling the total amount of carbohydrates in your meal or snack is more important than making sure you include all of the food groups at every meal. You may interchange carbohydrate containing foods (dairy, starches, and fruits). The meal plan below is an example of a 2000 calorie diet using carbohydrate counting. This meal plan has 17 carbohydrate servings. Breakfast  1 cup oatmeal (2 carb servings).   cup light yogurt (1 carb serving).  1 cup blueberries (1 carb serving).   cup  almonds. Snack  1 large apple (2 carb servings).  1 low-fat string cheese stick. Lunch  Chicken breast salad.  1 cup spinach.   cup chopped tomatoes.  2 oz chicken breast, sliced.  2 tbs low-fat [place] dressing.  12 whole-wheat crackers (2 carb servings).  12 to 15 grapes (  1 carb serving).  1 cup low-fat milk (1 carb serving). Snack  1 cup carrots.   cup hummus (1 carb serving). Dinner  3 oz broiled salmon.  1 cup brown rice (3 carb servings). Snack  1  cups steamed broccoli (1 carb serving) drizzled with 1 tsp olive oil and lemon juice.  1 cup light pudding (2 carb servings). DIABETES MEAL PLANNING WORKSHEET Your dietician can use this worksheet to help you decide how many servings of foods and what types of foods are right for you.  BREAKFAST Food Group and Servings / Carb Servings Grain/Starches __________________________________ Dairy __________________________________________ Vegetable ______________________________________ Fruit ___________________________________________ Meat __________________________________________ Fat ____________________________________________ LUNCH Food Group and Servings / Carb Servings Grain/Starches ___________________________________ Dairy ___________________________________________ Fruit ____________________________________________ Meat ___________________________________________ Fat _____________________________________________ Holly Hendrix Food Group and Servings / Carb Servings Grain/Starches ___________________________________ Dairy ___________________________________________ Fruit ____________________________________________ Meat ___________________________________________ Fat _____________________________________________ SNACKS Food Group and Servings / Carb Servings Grain/Starches ___________________________________ Dairy ___________________________________________ Vegetable  _______________________________________ Fruit ____________________________________________ Meat ___________________________________________ Fat _____________________________________________ DAILY TOTALS Starches _________________________ Vegetable ________________________ Fruit ____________________________ Dairy ____________________________ Meat ____________________________ Fat ______________________________ Document Released: 01/31/2005 Document Revised: 07/29/2011 Document Reviewed: 12/12/2008 [employer] Patient Information 2014 [employer]

## 2013-07-29 NOTE — Progress Notes (Signed)
MRN: [id number] Name: Holly Hendrix  Sex: female Age: 52 y.o. DOB: 1961-01-13  Allergies: Review of patient's allergies indicates no known allergies.  Chief Complaint  Patient presents with  . Follow-up  . Hypertension    HPI: Patient is 53 y.o. female who has to of hypertension, dyslipidemia, as per patient she ran out of her blood pressure medication for the last 3-4 weeks, her blood pressure today was elevated, she was given clonidine 0.1 mg, repeat manual blood pressure is 150/90, she denies any headache dizziness chest and shortness of breath, previous blood work reviewed noticed impaired fasting glucose, she is also requesting referral to see a dentist has lot of dental cavities.  Past Medical History  Diagnosis Date  . Hypertension     History reviewed. No pertinent past surgical history.    Medication List       This list is accurate as of: 07/29/13 11:35 AM.  Always use your most recent med list.               aspirin EC 81 MG tablet  Take 1 tablet (81 mg total) by mouth daily.     azithromycin 500 MG tablet  Commonly known as:  ZITHROMAX  Take 1 tablet (500 mg total) by mouth daily.     guaiFENesin-dextromethorphan 100-10 MG/5ML syrup  Commonly known as:  ROBITUSSIN DM  Take 10 mLs by mouth 3 (three) times daily as needed for cough.     lisinopril-hydrochlorothiazide 20-25 MG per tablet  Commonly known as:  PRINZIDE,ZESTORETIC  Take 1 tablet by mouth daily.     naproxen sodium 220 MG tablet  Commonly known as:  ANAPROX  Take 220 mg by mouth every 12 (twelve) hours as needed. Pain     rosuvastatin 10 MG tablet  Commonly known as:  CRESTOR  Take 1 tablet (10 mg total) by mouth daily.        Meds ordered this encounter  Medications  . cloNIDine (CATAPRES) tablet 0.1 mg    Sig:   . lisinopril-hydrochlorothiazide (PRINZIDE,ZESTORETIC) 20-25 MG per tablet    Sig: Take 1 tablet by mouth daily.    Dispense:  90 tablet    Refill:  3     Immunization History  Administered Date(s) Administered  . Influenza Whole 03/05/2010  . Td 05/20/2004    Family History  Problem Relation Age of Onset  . Arthritis Sister   . Diabetes Sister   . Hypertension Sister     History  Substance Use Topics  . Smoking status: Never Smoker   . Smokeless tobacco: Not on file  . Alcohol Use: No    Review of Systems   As noted in HPI  Filed Vitals:   07/29/13 1133  BP: 150/90  Pulse:   Temp:   Resp:     Physical Exam  Physical Exam  Constitutional: No distress.  HENT:  Dental cavities   Eyes: EOM are normal. Pupils are equal, round, and reactive to light.  Cardiovascular: Normal rate and regular rhythm.   Pulmonary/Chest: Breath sounds normal. No respiratory distress. She has no wheezes. She has no rales.    CBC    Component Value Date/Time   WBC 8.0 02/02/2013 1129   RBC 5.92* 02/02/2013 1129   RBC 5.92* 02/02/2013 1129   HGB 15.0 02/02/2013 1129   HCT 44.7 02/02/2013 1129   PLT 270 02/02/2013 1129   MCV 75.5* 02/02/2013 1129   LYMPHSABS 3.3 02/02/2013 1129   MONOABS 0.4  02/02/2013 1129   EOSABS 0.3 02/02/2013 1129   BASOSABS 0.1 02/02/2013 1129    CMP     Component Value Date/Time   NA 135 04/30/2013 1021   K 3.6 04/30/2013 1021   CL 98 04/30/2013 1021   CO2 30 04/30/2013 1021   GLUCOSE 131* 04/30/2013 1021   BUN 11 04/30/2013 1021   CREATININE 0.78 04/30/2013 1021   CREATININE 0.75 10/05/2009 2142   CALCIUM 9.3 04/30/2013 1021   PROT 7.2 04/30/2013 1021   ALBUMIN 3.9 04/30/2013 1021   AST 15 04/30/2013 1021   ALT 21 04/30/2013 1021   ALKPHOS 74 04/30/2013 1021   BILITOT 0.6 04/30/2013 1021    Lab Results  Component Value Date/Time   CHOL 178 04/30/2013 10:21 AM    No components found with this basename: hga1c    Lab Results  Component Value Date/Time   AST 15 04/30/2013 10:21 AM    Assessment and Plan  HTN (hypertension) - Plan: cloNIDine (CATAPRES) tablet 0.1 mg,  lisinopril-hydrochlorothiazide (PRINZIDE,ZESTORETIC) 20-25 MG per tablet, COMPLETE METABOLIC PANEL WITH GFR  Dyslipidemia Low-fat diet   Dental cavities - Plan: Ambulatory referral to Dentistry  IFG (impaired fasting glucose) - Plan: COMPLETE METABOLIC PANEL WITH GFR Advised patient for low carbohydrate diet, will repeat fasting blood chemistry prior to the next visit.   Return in about 3 months (around 10/29/2013) for hypertension.  Lorayne Marek, MD

## 2013-10-28 ENCOUNTER — Encounter: Payer: Self-pay | Admitting: Internal Medicine

## 2013-10-28 ENCOUNTER — Ambulatory Visit: Payer: No Typology Code available for payment source | Attending: Internal Medicine | Admitting: Internal Medicine

## 2013-10-28 VITALS — BP 132/85 | HR 77 | Temp 98.2°F | Resp 16 | Ht 64.0 in | Wt 222.0 lb

## 2013-10-28 DIAGNOSIS — Z6838 Body mass index (BMI) 38.0-38.9, adult: Secondary | ICD-10-CM | POA: Insufficient documentation

## 2013-10-28 DIAGNOSIS — I10 Essential (primary) hypertension: Secondary | ICD-10-CM | POA: Insufficient documentation

## 2013-10-28 DIAGNOSIS — Z7982 Long term (current) use of aspirin: Secondary | ICD-10-CM | POA: Insufficient documentation

## 2013-10-28 DIAGNOSIS — E669 Obesity, unspecified: Secondary | ICD-10-CM | POA: Insufficient documentation

## 2013-10-28 DIAGNOSIS — Z1211 Encounter for screening for malignant neoplasm of colon: Secondary | ICD-10-CM

## 2013-10-28 DIAGNOSIS — E785 Hyperlipidemia, unspecified: Secondary | ICD-10-CM | POA: Insufficient documentation

## 2013-10-28 LAB — COMPREHENSIVE METABOLIC PANEL
ALBUMIN: 4.1 g/dL (ref 3.5–5.2)
ALT: 24 U/L (ref 0–35)
AST: 17 U/L (ref 0–37)
Alkaline Phosphatase: 66 U/L (ref 39–117)
BUN: 12 mg/dL (ref 6–23)
CALCIUM: 9.1 mg/dL (ref 8.4–10.5)
CO2: 29 mEq/L (ref 19–32)
Chloride: 103 mEq/L (ref 96–112)
Creat: 0.64 mg/dL (ref 0.50–1.10)
Glucose, Bld: 102 mg/dL — ABNORMAL HIGH (ref 70–99)
POTASSIUM: 3.7 meq/L (ref 3.5–5.3)
SODIUM: 139 meq/L (ref 135–145)
TOTAL PROTEIN: 7.1 g/dL (ref 6.0–8.3)
Total Bilirubin: 0.4 mg/dL (ref 0.2–1.2)

## 2013-10-28 LAB — LIPID PANEL
Cholesterol: 172 mg/dL (ref 0–200)
HDL: 41 mg/dL (ref >39)
LDL Cholesterol: 117 mg/dL — ABNORMAL HIGH (ref 0–99)
Total CHOL/HDL Ratio: 4.2 Ratio
Triglycerides: 71 mg/dL (ref <150)
VLDL: 14 mg/dL (ref 0–40)

## 2013-10-28 LAB — HEMOGLOBIN A1C
HEMOGLOBIN A1C: 6.6 % — AB (ref <5.7)
Mean Plasma Glucose: 143 mg/dL — ABNORMAL HIGH (ref <117)

## 2013-10-28 MED ORDER — LISINOPRIL-HYDROCHLOROTHIAZIDE 20-25 MG PO TABS: 1.0000 | ORAL_TABLET | Freq: Every day | ORAL | Status: DC

## 2013-10-28 NOTE — Patient Instructions (Signed)

## 2013-10-28 NOTE — Progress Notes (Signed)
Patient ID: Holly Hendrix, female   DOB: 01-26-1961, 53 y.o.   MRN: [id number]   Holly Hendrix, is a 53 y.o. female  PQZ:[id number]  [id number]  DOB - January 11, 1961  Chief Complaint  Patient presents with  . Follow-up        Subjective:   Holly Hendrix is a 53 y.o. female here today for a follow up visit. Patient is known to have hypertension and dyslipidemia, she is on lisinopril-hydrochlorothiazide. She is compliant with medications. She has no complaint today. She is here for medication refills. During her last visit her hemoglobin A1c was 6.4%, she says that she has lost 16 pounds, her weight 6 months ago was 236 pounds, and today she weighs 222 pounds. She does not smoke cigarettes she does not drink alcohol Patient has No headache, No chest pain, No abdominal pain - No Nausea, No new weakness tingling or numbness, No Cough - SOB.  No problems updated.  ALLERGIES: No Known Allergies  PAST MEDICAL HISTORY: Past Medical History  Diagnosis Date  . Hypertension     MEDICATIONS AT HOME: Prior to Admission medications   Medication Sig Start Date End Date Taking? Authorizing Provider  lisinopril-hydrochlorothiazide (PRINZIDE,ZESTORETIC) 20-25 MG per tablet Take 1 tablet by mouth daily. 10/28/13  Yes Angelica Chessman, MD  aspirin EC 81 MG tablet Take 1 tablet (81 mg total) by mouth daily. 04/30/13   [name], MD  azithromycin (ZITHROMAX) 500 MG tablet Take 1 tablet (500 mg total) by mouth daily. 02/02/13   Angelica Chessman, MD  guaiFENesin-dextromethorphan (ROBITUSSIN DM) 100-10 MG/5ML syrup Take 10 mLs by mouth 3 (three) times daily as needed for cough. 02/02/13   Angelica Chessman, MD  naproxen sodium (ANAPROX) 220 MG tablet Take 220 mg by mouth every 12 (twelve) hours as needed. Pain    Historical Provider, MD  rosuvastatin (CRESTOR) 10 MG tablet Take 1 tablet (10 mg total) by mouth daily. 04/30/13   [name], MD     Objective:   Filed Vitals:   10/28/13 0937  BP:  132/85  Pulse: 77  Temp: 98.2 F (36.8 C)  TempSrc: Oral  Resp: 16  Height: 5\' 4"  (1.626 m)  Weight: 222 lb (100.699 kg)  SpO2: 96%    Exam General appearance : Awake, alert, not in any distress. Speech Clear. Not toxic looking, obese HEENT: Atraumatic and Normocephalic, pupils equally reactive to light and accomodation Neck: supple, no JVD. No cervical lymphadenopathy.  Chest:Good air entry bilaterally, no added sounds  CVS: S1 S2 regular, no murmurs.  Abdomen: Bowel sounds present, Non tender and not distended with no gaurding, rigidity or rebound. Extremities: B/L Lower Ext shows no edema, both legs are warm to touch Neurology: Awake alert, and oriented X 3, CN II-XII intact, Non focal Skin:No Rash Wounds:N/A  Data Review Lab Results  Component Value Date   HGBA1C 6.4 04/30/2013     Assessment & Plan   1. HTN (hypertension)  - lisinopril-hydrochlorothiazide (PRINZIDE,ZESTORETIC) 20-25 MG per tablet; Take 1 tablet by mouth daily.  Dispense: 90 tablet; Refill: 3 DASH diet  Repeat- Hemoglobin A1C - Comprehensive metabolic panel  2. Dyslipidemia  - Lipid panel Low-cholesterol low-fat diet Patient has lost 16 pounds in the last 6 months, she feels very happy and encouraged to continue her exercise regimen  3. Colon cancer screening  - HM COLONOSCOPY - Ambulatory referral to Gastroenterology  Patient was counseled extensively about nutrition and exercise Return in about 3 months (around 01/28/2014), or if symptoms worsen or  fail to improve, for Pap Smear, mammogram.  The patient was given clear instructions to go to ER or return to medical center if symptoms don't improve, worsen or new problems develop. The patient verbalized understanding. The patient was told to call to get lab results if they haven't heard anything in the next week.   This note has been created with Surveyor, quantity. Any transcriptional errors are  unintentional.    Angelica Chessman, MD, [place], [place], [place] [facility] [place], [place]   10/28/2013, 10:44 AM

## 2013-10-28 NOTE — Progress Notes (Signed)
Pt is here following up on her HTN. Pt is needing to refill her medications.

## 2013-10-29 ENCOUNTER — Telehealth: Payer: Self-pay | Admitting: Emergency Medicine

## 2013-10-29 ENCOUNTER — Telehealth: Payer: Self-pay

## 2013-10-29 DIAGNOSIS — E119 Type 2 diabetes mellitus without complications: Secondary | ICD-10-CM

## 2013-10-29 MED ORDER — METFORMIN HCL 500 MG PO TABS: 500.0000 mg | ORAL_TABLET | Freq: Two times a day (BID) | ORAL | Status: DC

## 2013-10-29 NOTE — Telephone Encounter (Signed)
Patient called and notified of her A1C results. Patient informed that her A1C indicates she has diabetes. Educated patient on increasing exercise and eating a low carb, low fat, low sugar diet.   Script for Metformin 500 mg twice daily sent to Pharmacy.  Patient indicates she is currently unemployed and is unable to afford medication. Spoke with [employer]. Patient able to obtain medication free once then medication will be $4.00.  Patient informed and patient verbalized understanding.  Referral sent for Diabetes Education.

## 2013-10-29 NOTE — Telephone Encounter (Signed)
Message copied by Ricci Barker on Fri Oct 29, 2013  5:07 PM ------      Message from: [name]      Created: Fri Oct 29, 2013 10:24 AM       Please inform patient that her laboratory tests shows improving cholesterol level however her hemoglobin A1c has gone to the diabetic range and now she needs to be called medication for diabetes. We will also make a referral for diabetic education. We encouraged her to continue her daily exercise regimen of low carbohydrate low sugar diet.            Place refer patient to diabetic education classes      Please call in prescription metformin 500 mg tablet by mouth twice a day ------

## 2013-11-30 ENCOUNTER — Ambulatory Visit: Payer: No Typology Code available for payment source

## 2013-12-07 ENCOUNTER — Ambulatory Visit: Payer: No Typology Code available for payment source

## 2013-12-14 ENCOUNTER — Ambulatory Visit: Payer: Self-pay

## 2014-01-31 ENCOUNTER — Encounter: Payer: Self-pay | Admitting: Internal Medicine

## 2014-03-07 ENCOUNTER — Ambulatory Visit: Payer: Self-pay

## 2014-11-03 ENCOUNTER — Other Ambulatory Visit: Payer: Self-pay | Admitting: Internal Medicine

## 2014-11-10 ENCOUNTER — Emergency Department (HOSPITAL_COMMUNITY): Payer: Self-pay

## 2014-11-10 ENCOUNTER — Encounter (HOSPITAL_COMMUNITY): Payer: Self-pay | Admitting: Emergency Medicine

## 2014-11-10 ENCOUNTER — Emergency Department (HOSPITAL_COMMUNITY)
Admission: EM | Admit: 2014-11-10 | Discharge: 2014-11-10 | Disposition: A | Discharge status: Home / Self Care | Payer: Self-pay | Attending: Emergency Medicine | Admitting: Emergency Medicine

## 2014-11-10 DIAGNOSIS — M545 Low back pain, unspecified: Secondary | ICD-10-CM

## 2014-11-10 DIAGNOSIS — M549 Dorsalgia, unspecified: Secondary | ICD-10-CM

## 2014-11-10 DIAGNOSIS — Y9389 Activity, other specified: Secondary | ICD-10-CM | POA: Insufficient documentation

## 2014-11-10 DIAGNOSIS — S3992XA Unspecified injury of lower back, initial encounter: Secondary | ICD-10-CM | POA: Insufficient documentation

## 2014-11-10 DIAGNOSIS — Z79899 Other long term (current) drug therapy: Secondary | ICD-10-CM | POA: Insufficient documentation

## 2014-11-10 DIAGNOSIS — Z792 Long term (current) use of antibiotics: Secondary | ICD-10-CM | POA: Insufficient documentation

## 2014-11-10 DIAGNOSIS — S29092A Other injury of muscle and tendon of back wall of thorax, initial encounter: Secondary | ICD-10-CM | POA: Insufficient documentation

## 2014-11-10 DIAGNOSIS — S0990XA Unspecified injury of head, initial encounter: Secondary | ICD-10-CM | POA: Insufficient documentation

## 2014-11-10 DIAGNOSIS — Y92410 Unspecified street and highway as the place of occurrence of the external cause: Secondary | ICD-10-CM | POA: Insufficient documentation

## 2014-11-10 DIAGNOSIS — I10 Essential (primary) hypertension: Secondary | ICD-10-CM | POA: Insufficient documentation

## 2014-11-10 DIAGNOSIS — Z7982 Long term (current) use of aspirin: Secondary | ICD-10-CM | POA: Insufficient documentation

## 2014-11-10 DIAGNOSIS — Y998 Other external cause status: Secondary | ICD-10-CM | POA: Insufficient documentation

## 2014-11-10 MED ORDER — NAPROXEN 500 MG PO TABS: 500.0000 mg | ORAL_TABLET | Freq: Two times a day (BID) | ORAL | Status: DC

## 2014-11-10 MED ORDER — METHOCARBAMOL 500 MG PO TABS: 500.0000 mg | ORAL_TABLET | Freq: Two times a day (BID) | ORAL | Status: DC
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
## 2014-12-01 ENCOUNTER — Telehealth: Payer: Self-pay | Admitting: Internal Medicine

## 2014-12-01 NOTE — Telephone Encounter (Signed)
Patient called requesting medication refill on lisinopril-hydrochlorothiazide (PRINZIDE,ZESTORETIC) 20-25 MG per tablet. Patient would like medication sent to [facility] on [address]. Please f/u

## 2015-05-08 NOTE — Telephone Encounter (Signed)
Patient has not been seen recently (since June 2015) and needs to re-establish care with Dr. Doreene Burke prior to medication refill as she has likely been out of lisinopril-HCTZ for at least 6 months now. Left HIPAA-compliant message requesting patient to schedule an appointment with provider for follow up.   Nicoletta Ba, PharmD, BCPS, [facility] [phone]
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
## 2016-06-13 ENCOUNTER — Encounter: Payer: Self-pay | Admitting: Family Medicine

## 2016-06-13 ENCOUNTER — Ambulatory Visit (INDEPENDENT_AMBULATORY_CARE_PROVIDER_SITE_OTHER): Payer: BLUE CROSS/BLUE SHIELD | Admitting: Family Medicine

## 2016-06-13 ENCOUNTER — Encounter: Payer: Self-pay | Admitting: Internal Medicine

## 2016-06-13 VITALS — BP 164/110 | HR 84 | Wt 208.6 lb

## 2016-06-13 DIAGNOSIS — E119 Type 2 diabetes mellitus without complications: Secondary | ICD-10-CM | POA: Insufficient documentation

## 2016-06-13 DIAGNOSIS — Z7689 Persons encountering health services in other specified circumstances: Secondary | ICD-10-CM

## 2016-06-13 DIAGNOSIS — E785 Hyperlipidemia, unspecified: Secondary | ICD-10-CM

## 2016-06-13 DIAGNOSIS — I10 Essential (primary) hypertension: Secondary | ICD-10-CM | POA: Diagnosis not present

## 2016-06-13 DIAGNOSIS — Z87898 Personal history of other specified conditions: Secondary | ICD-10-CM

## 2016-06-13 DIAGNOSIS — E669 Obesity, unspecified: Secondary | ICD-10-CM | POA: Insufficient documentation

## 2016-06-13 HISTORY — DX: Type 2 diabetes mellitus without complications: E11.9

## 2016-06-13 MED ORDER — LISINOPRIL-HYDROCHLOROTHIAZIDE 20-25 MG PO TABS: 1.0000 | ORAL_TABLET | Freq: Every day | ORAL | 1 refills | Status: DC

## 2016-06-13 NOTE — Patient Instructions (Signed)
Start taking blood pressure medication. Keep checking your blood pressure at home. I expect to see your readings go down and the ideal range is <140/80.  Follow up with me for a CPE in a month or so.   DASH Eating Plan DASH stands for "Dietary Approaches to Stop Hypertension." The DASH eating plan is a healthy eating plan that has been shown to reduce high blood pressure (hypertension). Additional health benefits may include reducing the risk of type 2 diabetes mellitus, heart disease, and stroke. The DASH eating plan may also help with weight loss. What do I need to know about the DASH eating plan? For the DASH eating plan, you will follow these general guidelines:  Choose foods with less than 150 milligrams of sodium per serving (as listed on the food label).  Use salt-free seasonings or herbs instead of table salt or sea salt.  Check with your health care provider or pharmacist before using salt substitutes.  Eat lower-sodium products. These are often labeled as "low-sodium" or "no salt added."  Eat fresh foods. Avoid eating a lot of canned foods.  Eat more vegetables, fruits, and low-fat dairy products.  Choose whole grains. Look for the word "whole" as the first word in the ingredient list.  Choose fish and skinless chicken or [place] more often than red meat. Limit fish, poultry, and meat to 6 oz (170 g) each day.  Limit sweets, desserts, sugars, and sugary drinks.  Choose heart-healthy fats.  Eat more home-cooked food and less restaurant, buffet, and fast food.  Limit fried foods.  Do not fry foods. Cook foods using methods such as baking, boiling, grilling, and broiling instead.  When eating at a restaurant, ask that your food be prepared with less salt, or no salt if possible. What foods can I eat? Seek help from a dietitian for individual calorie needs. Grains  Whole grain or whole wheat bread. Brown rice. Whole grain or whole wheat pasta. Quinoa, bulgur, and whole  grain cereals. Low-sodium cereals. Corn or whole wheat flour tortillas. Whole grain cornbread. Whole grain crackers. Low-sodium crackers. Vegetables  Fresh or frozen vegetables (raw, steamed, roasted, or grilled). Low-sodium or reduced-sodium tomato and vegetable juices. Low-sodium or reduced-sodium tomato sauce and paste. Low-sodium or reduced-sodium canned vegetables. Fruits  All fresh, canned (in natural juice), or frozen fruits. Meat and Other Protein Products  Ground beef (85% or leaner), grass-fed beef, or beef trimmed of fat. Skinless chicken or [place]. Ground chicken or [place]. Pork trimmed of fat. All fish and seafood. Eggs. Dried beans, peas, or lentils. Unsalted nuts and seeds. Unsalted canned beans. Dairy  Low-fat dairy products, such as skim or 1% milk, 2% or reduced-fat cheeses, low-fat ricotta or cottage cheese, or plain low-fat yogurt. Low-sodium or reduced-sodium cheeses. Fats and Oils  Tub margarines without trans fats. Light or reduced-fat mayonnaise and salad dressings (reduced sodium). Avocado. Safflower, olive, or canola oils. Natural peanut or almond butter. Other  Unsalted popcorn and pretzels. The items listed above may not be a complete list of recommended foods or beverages. Contact your dietitian for more options.  What foods are not recommended? Grains  White bread. White pasta. White rice. Refined cornbread. Bagels and croissants. Crackers that contain trans fat. Vegetables  Creamed or fried vegetables. Vegetables in a cheese sauce. Regular canned vegetables. Regular canned tomato sauce and paste. Regular tomato and vegetable juices. Fruits  Canned fruit in light or heavy syrup. Fruit juice. Meat and Other Protein Products  Fatty cuts of meat.  Ribs, chicken wings, bacon, sausage, bologna, salami, chitterlings, fatback, hot dogs, bratwurst, and packaged luncheon meats. Salted nuts and seeds. Canned beans with salt. Dairy  Whole or 2% milk, cream, half-and-half,  and cream cheese. Whole-fat or sweetened yogurt. Full-fat cheeses or blue cheese. Nondairy creamers and whipped toppings. Processed cheese, cheese spreads, or cheese curds. Condiments  Onion and garlic salt, seasoned salt, table salt, and sea salt. Canned and packaged gravies. Worcestershire sauce. Tartar sauce. Barbecue sauce. Teriyaki sauce. Soy sauce, including reduced sodium. Steak sauce. Fish sauce. Oyster sauce. Cocktail sauce. Horseradish. Ketchup and mustard. Meat flavorings and tenderizers. Bouillon cubes. Hot sauce. Tabasco sauce. Marinades. Taco seasonings. Relishes. Fats and Oils  Butter, stick margarine, lard, shortening, ghee, and bacon fat. Coconut, palm kernel, or palm oils. Regular salad dressings. Other  Pickles and olives. Salted popcorn and pretzels. The items listed above may not be a complete list of foods and beverages to avoid. Contact your dietitian for more information.  Where can I find more information? National Heart, Lung, and Blood Institute: travelstabloid.com This information is not intended to replace advice given to you by your health care provider. Make sure you discuss any questions you have with your health care provider. Document Released: 04/25/2011 Document Revised: 10/12/2015 Document Reviewed: 03/10/2013 Elsevier Interactive Patient Education  2017 [employer].

## 2016-06-13 NOTE — Progress Notes (Signed)
   Subjective:    Patient ID: Holly Hendrix, female    DOB: Dec 17, 1960, 56 y.o.   MRN: [id number]  HPI Chief Complaint  Patient presents with  . new pt    new pt. blood pressure running high off and on. was on lisinopril-hctz and then switched to lisinopril   She is new to the practice and here to establish care.  States she has no concerns or complaints today but needs medication refilled.   History of HTN and diagnosed many years ago.  States she ran out of BP medication and has not taken it in a week or so.  Does check her blood pressure at home. States BP was in the 130s when she was taking Lisinopril/ HCTZ 20-25 mg.   States she has a history of prediabetes. Has been on Metformin in the past but not recently. Last Hgb A1C was 6.0 May 11 2016.  She brought labs with her today. Recent lipid panel shows that she has elevated LDL. She states she has never taken cholesterol medication.   Denies fever, chills, dizziness, vision changes, chest pain, palpitations, shortness of breath, cough, orthopnea, abdominal pain, GI or GU symptoms. No LE edema.   Other providers: none  Social history: Lives alone, 2 sons 1 in [place] and 1 in [place].  works in Estate manager/land agent at [employer]  Denies smoking, drinking alcohol, drug use  Diet: nothing particular. Cut back on fried food and sweets. Eats more salads.  Excerise: elliptical 2 days per week.   Reviewed allergies, medications, past medical, surgical, and social history.   Review of Systems Pertinent positives and negatives in the history of present illness.     Objective:   Physical Exam BP (!) 164/110   Pulse 84   Wt 208 lb 9.6 oz (94.6 kg)   BMI 35.81 kg/m   Alert and in no distress. Pharyngeal area is normal. Neck is supple without adenopathy or thyromegaly. Cardiac exam shows a regular sinus rhythm without murmurs or gallops. Lungs are clear to auscultation. Extremities without edema, pulses normal.       Assessment &  Plan:  Hypertension, unspecified type  History of prediabetes  Encounter to establish care  Dyslipidemia  Obesity (BMI 30-39.9)  Discussed starting her back on medication for HTN. Counseled on HTN and keeping her BP in goal range in order reduce worsening chronic illnesses such as heart disease. She will check her BP at home and report back readings. DASH diet discussed.  Prediabetes but does not know dose of Metformin she was taking. She will call her pharmacy and call us with this information. Discussed holding off on metformin and healthy lifestyle modifications. Will repeat A1C in 3 months and start her back on medication as needed.  Counseled on diet and exercise for elevated LDL.  Plan to have her schedule for a non fasting CPE and get her caught up on health maintenance and immunizations.

## 2016-06-18 ENCOUNTER — Telehealth: Payer: Self-pay | Admitting: Family Medicine

## 2016-06-18 NOTE — Telephone Encounter (Signed)
Pt called stating that she was to call & give Vickie a name of a med. It is Metformin HCL 500 mg

## 2016-07-10 ENCOUNTER — Ambulatory Visit (INDEPENDENT_AMBULATORY_CARE_PROVIDER_SITE_OTHER): Payer: BLUE CROSS/BLUE SHIELD | Admitting: Family Medicine

## 2016-07-10 ENCOUNTER — Encounter: Payer: Self-pay | Admitting: Family Medicine

## 2016-07-10 VITALS — BP 128/82 | HR 93 | Wt 203.8 lb

## 2016-07-10 DIAGNOSIS — I10 Essential (primary) hypertension: Secondary | ICD-10-CM | POA: Diagnosis not present

## 2016-07-10 DIAGNOSIS — E785 Hyperlipidemia, unspecified: Secondary | ICD-10-CM | POA: Diagnosis not present

## 2016-07-10 NOTE — Progress Notes (Signed)
   Subjective:    Patient ID: Holly Hendrix, female    DOB: April 25, 1961, 56 y.o.   MRN: [id number]  HPI Chief Complaint  Patient presents with  . 1 month follow-up    1 month follow-up- back and hip pain   She is here to follow up on HTN. We switched her medication at her last visit due to uncontrolled HTN. Today is her second visit to our office. She states she is not having any side effects and is doing well on the Lisinopril-HCTZ.  States her blood pressure at home has been much improved from when I saw her one month ago but she did not bring in her readings.  Has been exercising, walking or doing the elliptical most days of the week. Drinking more water.    History of prediabetes and elevated LDL. She has taken Metformin in the past but not lately. Last Hgb A1c 6.0% on 12/223/2017. She has not taken cholesterol medication in the past.   Denies fever, chills, dizziness, headaches, vision changes, chest pain, palpitations, cough, shortness of breath, orthopnea, abdominal pain, GI or GU symptoms. No increased thirst or hunger.  States she did lift something heavy one day last week and had right low back and right hip pain but this has resolved.   No other concerns or complaints today.    Review of Systems Pertinent positives and negatives in the history of present illness.     Objective:   Physical Exam BP 128/82   Pulse 93   Wt 203 lb 12.8 oz (92.4 kg)   BMI 34.98 kg/m   Alert and in no distress. Cardiac exam shows a regular sinus rhythm without murmurs or gallops. Lungs are clear to auscultation. Extremities without edema. Normal gait.       Assessment & Plan:  Essential hypertension  Dyslipidemia  Discussed that she appears to be doing well. Blood pressure is within goal range. She is eating healthier and exercising. Continue on current medication. Reviewed labs from 04/2016. Plan to have her return for fasting CPE and for chronic health conditions in late March or early  April.

## 2016-08-23 ENCOUNTER — Encounter: Payer: Self-pay | Admitting: Family Medicine

## 2016-08-23 ENCOUNTER — Other Ambulatory Visit (HOSPITAL_COMMUNITY)
Admission: RE | Admit: 2016-08-23 | Discharge: 2016-08-23 | Disposition: A | Discharge status: Home / Self Care | Payer: BLUE CROSS/BLUE SHIELD | Source: Ambulatory Visit | Attending: Family Medicine | Admitting: Family Medicine

## 2016-08-23 ENCOUNTER — Encounter: Payer: Self-pay | Admitting: Internal Medicine

## 2016-08-23 ENCOUNTER — Ambulatory Visit (INDEPENDENT_AMBULATORY_CARE_PROVIDER_SITE_OTHER): Payer: BLUE CROSS/BLUE SHIELD | Admitting: Family Medicine

## 2016-08-23 VITALS — BP 126/82 | HR 87 | Ht 63.5 in | Wt 200.4 lb

## 2016-08-23 DIAGNOSIS — Z124 Encounter for screening for malignant neoplasm of cervix: Secondary | ICD-10-CM | POA: Insufficient documentation

## 2016-08-23 DIAGNOSIS — E669 Obesity, unspecified: Secondary | ICD-10-CM

## 2016-08-23 DIAGNOSIS — E2839 Other primary ovarian failure: Secondary | ICD-10-CM | POA: Diagnosis not present

## 2016-08-23 DIAGNOSIS — Z0000 Encounter for general adult medical examination without abnormal findings: Secondary | ICD-10-CM

## 2016-08-23 DIAGNOSIS — Z87898 Personal history of other specified conditions: Secondary | ICD-10-CM | POA: Diagnosis present

## 2016-08-23 DIAGNOSIS — Z1231 Encounter for screening mammogram for malignant neoplasm of breast: Secondary | ICD-10-CM | POA: Diagnosis not present

## 2016-08-23 DIAGNOSIS — Z1159 Encounter for screening for other viral diseases: Secondary | ICD-10-CM | POA: Insufficient documentation

## 2016-08-23 DIAGNOSIS — I10 Essential (primary) hypertension: Secondary | ICD-10-CM

## 2016-08-23 DIAGNOSIS — R32 Unspecified urinary incontinence: Secondary | ICD-10-CM | POA: Diagnosis not present

## 2016-08-23 DIAGNOSIS — Z1239 Encounter for other screening for malignant neoplasm of breast: Secondary | ICD-10-CM

## 2016-08-23 DIAGNOSIS — E785 Hyperlipidemia, unspecified: Secondary | ICD-10-CM | POA: Insufficient documentation

## 2016-08-23 DIAGNOSIS — Z1211 Encounter for screening for malignant neoplasm of colon: Secondary | ICD-10-CM

## 2016-08-23 LAB — POCT URINALYSIS DIPSTICK
Blood, UA: NEGATIVE
Glucose, UA: NEGATIVE
Ketones, UA: NEGATIVE
Nitrite, UA: NEGATIVE
Spec Grav, UA: 1.03 (ref 1.030–1.035)
Urobilinogen, UA: NEGATIVE (ref ?–2.0)
pH, UA: 6 (ref 5.0–8.0)

## 2016-08-23 LAB — BASIC METABOLIC PANEL
BUN: 18 mg/dL (ref 7–25)
CO2: 28 mmol/L (ref 20–31)
Calcium: 9.9 mg/dL (ref 8.6–10.4)
Chloride: 97 mmol/L — ABNORMAL LOW (ref 98–110)
Creat: 0.87 mg/dL (ref 0.50–1.05)
GLUCOSE: 117 mg/dL — AB (ref 65–99)
POTASSIUM: 3.7 mmol/L (ref 3.5–5.3)
SODIUM: 137 mmol/L (ref 135–146)

## 2016-08-23 LAB — LIPID PANEL
Cholesterol: 209 mg/dL — ABNORMAL HIGH (ref <200)
HDL: 48 mg/dL — AB (ref >50)
LDL Cholesterol: 141 mg/dL — ABNORMAL HIGH (ref <100)
Total CHOL/HDL Ratio: 4.4 Ratio (ref <5.0)
Triglycerides: 100 mg/dL (ref <150)
VLDL: 20 mg/dL (ref <30)

## 2016-08-23 LAB — TSH: TSH: 1.08 mIU/L

## 2016-08-23 NOTE — Progress Notes (Signed)
Subjective:    Patient ID: Holly Hendrix, female    DOB: 1960-08-17, 56 y.o.   MRN: [id number]  HPI Chief Complaint  Patient presents with  . fasting cpe    fasting cpe, achy in back   She is here for a complete physical exam. Complains of thoracic back pain that has been ongoing for at least a year. Pain is intermittent and described as a dull ache, non radiating. Occasionally pain occurs with movement and at night while lying in bed. No numbness, tingling or weakness. Takes Aleve occasionally for this and it helps. States she has to lift heavy things at work. Denies seeing anyone for this issue.  Does not want an XR at this point. States it is not bothering her that much.   Complains of a several month history of urinary urgency with leakage at random times. Urine leakage not associated only with stress or straining. States she wears a thin panty liner for protection. Nocturia - 2-3 times per night. Denies dysuria. She does have a history of urge incontinence in her chart from 2012. Denies being treated for this.   Last CPE: years ago   Other providers: none Diet: has improved her diet.  Excerise: 2- 3 days per week.   Immunizations: she will get records.   Health maintenance:  Mammogram: 2014  Colonoscopy: never  Last Gynecological Exam: years ago  Last Menstrual cycle: years ago  Last Dental Exam: years ago  Last Eye Exam: years ago   Wears seatbelt always, does not use sunscreen, smoke detectors in home and functioning, does not text while driving and feels safe in home environment.   Reviewed allergies, medications, past medical, surgical, family, and social history.   Review of Systems Review of Systems Constitutional: -fever, -chills, -sweats, -unexpected weight change,-fatigue ENT: -runny nose, -ear pain, -sore throat Cardiology:  -chest pain, -palpitations, -edema Respiratory: -cough, -shortness of breath, -wheezing Gastroenterology: -abdominal pain, -nausea,  -vomiting, -diarrhea, -constipation  Hematology: -bleeding or bruising problems Musculoskeletal: -arthralgias, -myalgias, -joint swelling, + mid to upper back pain Ophthalmology: -vision changes Urology: -dysuria, -difficulty urinating, -hematuria, +urinary frequency, +urgency Neurology: -headache, -weakness, -tingling, -numbness       Objective:   Physical Exam BP 126/82   Pulse 87   Ht 5' 3.5" (1.613 m)   Wt 200 lb 6.4 oz (90.9 kg)   BMI 34.94 kg/m   General Appearance:    Alert, cooperative, no distress, appears stated age  Head:    Normocephalic, without obvious abnormality, atraumatic  Eyes:    PERRL, conjunctiva/corneas clear, EOM's intact, fundi    benign  Ears:    Normal TM's and external ear canals  Nose:   Nares normal, mucosa normal, no drainage or sinus   tenderness  Throat:   Lips, mucosa, and tongue normal; teeth and gums normal  Neck:   Supple, no lymphadenopathy;  thyroid:  no   enlargement/tenderness/nodules; no carotid   bruit or JVD  Back:    Spine nontender, no curvature, ROM normal, no CVA     tenderness  Lungs:     Clear to auscultation bilaterally without wheezes, rales or     ronchi; respirations unlabored  Chest Wall:    No tenderness or deformity   Heart:    Regular rate and rhythm, S1 and S2 normal, no murmur, rub   or gallop  Breast Exam:    No tenderness, masses, or nipple discharge or inversion.      No axillary lymphadenopathy  Abdomen:     Soft, non-tender, nondistended, normoactive bowel sounds,    no masses, no hepatosplenomegaly  Genitalia:    Normal external genitalia without lesions.  BUS and vagina normal; cervix without lesions, or cervical motion tenderness. No abnormal vaginal discharge.  Uterus and adnexa not enlarged, nontender, no masses.  Pap performed and chaperone present.   Rectal:   not done. Referral to GI.   Extremities:   No clubbing, cyanosis or edema  Pulses:   2+ and symmetric all extremities  Skin:   Skin color, texture,  turgor normal, no rashes or lesions  Lymph nodes:   Cervical, supraclavicular, and axillary nodes normal  Neurologic:   CNII-XII intact, normal strength, sensation and gait; reflexes 2+ and symmetric throughout          Psych:   Normal mood, affect, hygiene and grooming.    Urinalysis dipstick: spec grav 1.030, leuk 3+, pro 1+,        Assessment & Plan:  Routine general medical examination at a health care facility - Plan: Basic metabolic panel, POCT urinalysis dipstick, TSH  HYPERTENSION, BENIGN ESSENTIAL  History of prediabetes - Plan: Basic metabolic panel, Hemoglobin A1c  Dyslipidemia - Plan: Lipid panel  Screen for colon cancer - Plan: Ambulatory referral to Gastroenterology  Screening for breast cancer - Plan: MM DIGITAL SCREENING BILATERAL  Screening for cervical cancer - Plan: Cytology - PAP  Estrogen deficiency  Obesity (BMI 30-39.9) - Plan: TSH  Need for hepatitis C screening test - Plan: Hepatitis C antibody  Urinary incontinence, unspecified type - Plan: POCT urinalysis dipstick  Congratulated her on losing 8 lbs by making healthy diet changes and increasing exercise. Encouraged her to continue with healthy lifestyle. Discussed benefit of even a small amount of weight loss in regards to her health.  HTN- her blood pressure is within goal range today. Continue on current medication.  History of prediabetes- will check Hbg A1c. Counseled on diet and exercise.  History of elevated LDL- check lipid panel.  Offered to send her for an XR for back pain and she would like to wait and see if this improves. She agrees to let me know if the pain worsens or any new symptoms arise.  She is deficient in several areas of health maintenance and will update these.  Hep C ordered.  Mammogram ordered.  Pap smear performed.  GI referral made.  She will get her immunization record and let me know if she needs immunizations.  Breakthrough for pelvic floor strengthening. Urinary  incontinence has been ongoing for years and not worsening per patient.Consider medication or OAB study in the future. Plan to follow up pending labs.

## 2016-08-23 NOTE — Patient Instructions (Addendum)
Check your last immunizations including Tetanus (Tdap) and let me know if you need to be updated.   Continue eating healthy and exercising. Good job on your recent weight loss! Keep going!  Go to [facility] or somewhere similar for an eye exam. You may need glasses.   I am referring you for bladder training to help with your bladder issues. They will call to set up an appointment. Try to avoid fluids after supper time and watch your caffeine intake.   The GI office will call you to schedule your colonoscopy but the first visit is an office visit and you can drive yourself.   Call and schedule your mammogram.     Preventative Care for Adults - Female      MAINTAIN REGULAR HEALTH EXAMS:  A routine yearly physical is a good way to check in with your primary care provider about your health and preventive screening. It is also an opportunity to share updates about your health and any concerns you have, and receive a thorough all-over exam.   Most health insurance companies pay for at least some preventative services.  Check with your health plan for specific coverages.  WHAT PREVENTATIVE SERVICES DO WOMEN NEED?  Adult women should have their weight and blood pressure checked regularly.   Women age 83 and older should have their cholesterol levels checked regularly.  Women should be screened for cervical cancer with a Pap smear and pelvic exam beginning at either age 42, or 3 years after they become sexually activity.    Breast cancer screening generally begins at age 24 with a mammogram and breast exam by your primary care provider.    Beginning at age 16 and continuing to age 41, women should be screened for colorectal cancer.  Certain people may need continued testing until age 82.  Updating vaccinations is part of preventative care.  Vaccinations help protect against diseases such as the flu.  Osteoporosis is a disease in which the bones lose minerals and strength as we age. Women ages  6 and over should discuss this with their caregivers, as should women after menopause who have other risk factors.  Lab tests are generally done as part of preventative care to screen for anemia and blood disorders, to screen for problems with the kidneys and liver, to screen for bladder problems, to check blood sugar, and to check your cholesterol level.  Preventative services generally include counseling about diet, exercise, avoiding tobacco, drugs, excessive alcohol consumption, and sexually transmitted infections.    GENERAL RECOMMENDATIONS FOR GOOD HEALTH:  Healthy diet:  Eat a variety of foods, including fruit, vegetables, animal or vegetable protein, such as meat, fish, chicken, and eggs, or beans, lentils, tofu, and grains, such as rice.  Drink plenty of water daily.  Decrease saturated fat in the diet, avoid lots of red meat, processed foods, sweets, fast foods, and fried foods.  Exercise:  Aerobic exercise helps maintain good heart health. At least 30-40 minutes of moderate-intensity exercise is recommended. For example, a brisk walk that increases your heart rate and breathing. This should be done on most days of the week.   Find a type of exercise or a variety of exercises that you enjoy so that it becomes a part of your daily life.  Examples are running, walking, swimming, water aerobics, and biking.  For motivation and support, explore group exercise such as aerobic class, spin class, Zumba, Yoga,or  martial arts, etc.    Set exercise goals for yourself,  such as a certain weight goal, walk or run in a race such as a 5k walk/run.  Speak to your primary care provider about exercise goals.  Disease prevention:  If you smoke or chew tobacco, find out from your caregiver how to quit. It can literally save your life, no matter how long you have been a tobacco user. If you do not use tobacco, never begin.   Maintain a healthy diet and normal weight. Increased weight leads to  problems with blood pressure and diabetes.   The Body Mass Index or BMI is a way of measuring how much of your body is fat. Having a BMI above 27 increases the risk of heart disease, diabetes, hypertension, stroke and other problems related to obesity. Your caregiver can help determine your BMI and based on it develop an exercise and dietary program to help you achieve or maintain this important measurement at a healthful level.  High blood pressure causes heart and blood vessel problems.  Persistent high blood pressure should be treated with medicine if weight loss and exercise do not work.   Fat and cholesterol leaves deposits in your arteries that can block them. This causes heart disease and vessel disease elsewhere in your body.  If your cholesterol is found to be high, or if you have heart disease or certain other medical conditions, then you may need to have your cholesterol monitored frequently and be treated with medication.   Ask if you should have a cardiac stress test if your history suggests this. A stress test is a test done on a treadmill that looks for heart disease. This test can find disease prior to there being a problem.  Menopause can be associated with physical symptoms and risks. Hormone replacement therapy is available to decrease these. You should talk to your caregiver about whether starting or continuing to take hormones is right for you.   Osteoporosis is a disease in which the bones lose minerals and strength as we age. This can result in serious bone fractures. Risk of osteoporosis can be identified using a bone density scan. Women ages 81 and over should discuss this with their caregivers, as should women after menopause who have other risk factors. Ask your caregiver whether you should be taking a calcium supplement and Vitamin D, to reduce the rate of osteoporosis.   Avoid drinking alcohol in excess (more than two drinks per day).  Avoid use of street drugs. Do not  share needles with anyone. Ask for professional help if you need assistance or instructions on stopping the use of alcohol, cigarettes, and/or drugs.  Brush your teeth twice a day with fluoride toothpaste, and floss once a day. Good oral hygiene prevents tooth decay and gum disease. The problems can be painful, unattractive, and can cause other health problems. Visit your dentist for a routine oral and dental check up and preventive care every 6-12 months.   Look at your skin regularly.  Use a mirror to look at your back. Notify your caregivers of changes in moles, especially if there are changes in shapes, colors, a size larger than a pencil eraser, an irregular border, or development of new moles.  Safety:  Use seatbelts 100% of the time, whether driving or as a passenger.  Use safety devices such as hearing protection if you work in environments with loud noise or significant background noise.  Use safety glasses when doing any work that could send debris in to the eyes.  Use a helmet  if you ride a bike or motorcycle.  Use appropriate safety gear for contact sports.  Talk to your caregiver about gun safety.  Use sunscreen with a SPF (or skin protection factor) of 15 or greater.  Lighter skinned people are at a greater risk of skin cancer. Don't forget to also wear sunglasses in order to protect your eyes from too much damaging sunlight. Damaging sunlight can accelerate cataract formation.   Practice safe sex. Use condoms. Condoms are used for birth control and to help reduce the spread of sexually transmitted infections (or STIs).  Some of the STIs are gonorrhea (the clap), chlamydia, syphilis, trichomonas, herpes, HPV (human papilloma virus) and HIV (human immunodeficiency virus) which causes AIDS. The herpes, HIV and HPV are viral illnesses that have no cure. These can result in disability, cancer and death.   Keep carbon monoxide and smoke detectors in your home functioning at all times. Change  the batteries every 6 months or use a model that plugs into the wall.   Vaccinations:  Stay up to date with your tetanus shots and other required immunizations. You should have a booster for tetanus every 10 years. Be sure to get your flu shot every year, since 5%-20% of the [place] population comes down with the flu. The flu vaccine changes each year, so being vaccinated once is not enough. Get your shot in the fall, before the flu season peaks.   Other vaccines to consider:  Human Papilloma Virus or HPV causes cancer of the cervix, and other infections that can be transmitted from person to person. There is a vaccine for HPV, and females should get immunized between the ages of 54 and 55. It requires a series of 3 shots.   Pneumococcal vaccine to protect against certain types of pneumonia.  This is normally recommended for adults age 16 or older.  However, adults younger than 58 years old with certain underlying conditions such as diabetes, heart or lung disease should also receive the vaccine.  Shingles vaccine to protect against Varicella Zoster if you are older than age 9, or younger than 58 years old with certain underlying illness.  Hepatitis A vaccine to protect against a form of infection of the liver by a virus acquired from food.  Hepatitis B vaccine to protect against a form of infection of the liver by a virus acquired from blood or body fluids, particularly if you work in health care.  If you plan to travel internationally, check with your local health department for specific vaccination recommendations.  Cancer Screening:  Breast cancer screening is essential to preventive care for women. All women age 94 and older should perform a breast self-exam every month. At age 29 and older, women should have their caregiver complete a breast exam each year. Women at ages 44 and older should have a mammogram (x-ray film) of the breasts. Your caregiver can discuss how often you need  mammograms.    Cervical cancer screening includes taking a Pap smear (sample of cells examined under a microscope) from the cervix (end of the uterus). It also includes testing for HPV (Human Papilloma Virus, which can cause cervical cancer). Screening and a pelvic exam should begin at age 70, or 3 years after a woman becomes sexually active. Screening should occur every year, with a Pap smear but no HPV testing, up to age 78. After age 68, you should have a Pap smear every 3 years with HPV testing, if no HPV was found previously.  Most routine colon cancer screening begins at the age of 80. On a yearly basis, doctors may provide special easy to use take-home tests to check for hidden blood in the stool. Sigmoidoscopy or colonoscopy can detect the earliest forms of colon cancer and is life saving. These tests use a small camera at the end of a tube to directly examine the colon. Speak to your caregiver about this at age 28, when routine screening begins (and is repeated every 5 years unless early forms of pre-cancerous polyps or small growths are found).

## 2016-08-24 LAB — HEMOGLOBIN A1C
HEMOGLOBIN A1C: 5.9 % — AB (ref <5.7)
Mean Plasma Glucose: 123 mg/dL

## 2016-08-24 LAB — HEPATITIS C ANTIBODY: HCV Ab: NEGATIVE

## 2016-08-25 ENCOUNTER — Other Ambulatory Visit: Payer: Self-pay | Admitting: Family Medicine

## 2016-08-25 DIAGNOSIS — E782 Mixed hyperlipidemia: Secondary | ICD-10-CM

## 2016-08-25 MED ORDER — ATORVASTATIN CALCIUM 20 MG PO TABS: 20.0000 mg | ORAL_TABLET | Freq: Every day | ORAL | 2 refills | Status: DC

## 2016-08-27 LAB — CYTOLOGY - PAP
Diagnosis: NEGATIVE
HPV (WINDOPATH): NOT DETECTED

## 2016-08-28 ENCOUNTER — Encounter: Payer: Self-pay | Admitting: Internal Medicine

## 2016-09-11 ENCOUNTER — Encounter: Payer: BLUE CROSS/BLUE SHIELD | Admitting: Family Medicine

## 2016-09-17 ENCOUNTER — Ambulatory Visit
Admission: RE | Admit: 2016-09-17 | Discharge: 2016-09-17 | Disposition: A | Discharge status: Home / Self Care | Payer: BLUE CROSS/BLUE SHIELD | Source: Ambulatory Visit | Attending: Family Medicine | Admitting: Family Medicine

## 2016-09-17 DIAGNOSIS — Z1239 Encounter for other screening for malignant neoplasm of breast: Secondary | ICD-10-CM

## 2016-10-28 ENCOUNTER — Encounter: Payer: Self-pay | Admitting: Internal Medicine

## 2016-10-28 ENCOUNTER — Ambulatory Visit (AMBULATORY_SURGERY_CENTER): Payer: Self-pay | Admitting: *Deleted

## 2016-10-28 VITALS — Ht 63.5 in | Wt 199.0 lb

## 2016-10-28 DIAGNOSIS — Z1211 Encounter for screening for malignant neoplasm of colon: Secondary | ICD-10-CM

## 2016-10-28 NOTE — Progress Notes (Signed)
Patient denies any allergies to eggs or soy. Patient denies any problems with anesthesia/sedation. Patient denies any oxygen use at home and does not take any diet/weight loss medications. EMMI education assisgned to patient on colonoscopy, this was explained and instructions given to patient. 

## 2016-10-31 ENCOUNTER — Ambulatory Visit (INDEPENDENT_AMBULATORY_CARE_PROVIDER_SITE_OTHER): Payer: BLUE CROSS/BLUE SHIELD | Admitting: Family Medicine

## 2016-10-31 ENCOUNTER — Encounter: Payer: Self-pay | Admitting: Family Medicine

## 2016-10-31 VITALS — BP 120/82 | HR 83 | Ht 63.5 in | Wt 200.0 lb

## 2016-10-31 DIAGNOSIS — E119 Type 2 diabetes mellitus without complications: Secondary | ICD-10-CM | POA: Diagnosis not present

## 2016-10-31 DIAGNOSIS — I10 Essential (primary) hypertension: Secondary | ICD-10-CM

## 2016-10-31 DIAGNOSIS — E785 Hyperlipidemia, unspecified: Secondary | ICD-10-CM

## 2016-10-31 DIAGNOSIS — N3941 Urge incontinence: Secondary | ICD-10-CM | POA: Diagnosis not present

## 2016-10-31 DIAGNOSIS — R800 Isolated proteinuria: Secondary | ICD-10-CM

## 2016-10-31 LAB — POCT URINALYSIS DIP (PROADVANTAGE DEVICE)
Bilirubin, UA: NEGATIVE
Glucose, UA: NEGATIVE mg/dL
Ketones, POC UA: NEGATIVE mg/dL
NITRITE UA: NEGATIVE
PH UA: 6 (ref 5.0–8.0)
Protein Ur, POC: NEGATIVE mg/dL
RBC UA: NEGATIVE
Specific Gravity, Urine: 1.02
UUROB: NEGATIVE

## 2016-10-31 LAB — LIPID PANEL
Cholesterol: 117 mg/dL (ref <200)
HDL: 46 mg/dL — ABNORMAL LOW (ref >50)
LDL CALC: 59 mg/dL (ref <100)
Total CHOL/HDL Ratio: 2.5 Ratio (ref <5.0)
Triglycerides: 61 mg/dL (ref <150)
VLDL: 12 mg/dL (ref <30)

## 2016-10-31 NOTE — Progress Notes (Signed)
   Subjective:    Patient ID: Holly Hendrix, female    DOB: 03-13-1961, 56 y.o.   MRN: [id number]  HPI Chief Complaint  Patient presents with  . follow-up    follow-up- fasting   She is here for a 2 month follow up on hyperlipidemia due to starting new medication for cholesterol. She reports good compliance on atorvastatin. No side effects.  She has associated diabetes type 2 that is currently controlled with diet and exercise. No reports of increased thirst, urination or hunger. No unexplained weight loss.  Denies fever, chills, vision changes, chest pain, abdominal pain, N/V/D. No myalgias or arthralgias.   Urine had a significant amount of leukocytes and a small amount of protein.  She has not scheduled with [facility] for pelvic floor strengthening for urge incontinence. States this issue has not worsened and is "about the same".   She has not been checking BP at home. Has been taking daily medication and no concerns.   She is scheduled for a colonoscopy on June 25th.   Reviewed allergies, medications, past medical, surgical,  and social history.   Review of Systems Pertinent positives and negatives in the history of present illness.     Objective:   Physical Exam BP 120/82   Pulse 83   Ht 5' 3.5" (1.613 m)   Wt 200 lb (90.7 kg)   BMI 34.87 kg/m   Alert and oriented and in no acute distress. Not otherwise examined.      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   Recommend checking BP at home 2-3 times per week. Continue with healthy diet and exercise. DASH diet discussed.  Continue on current medication.  She is not checking her  blood sugars at home. Last A1c in goal range with diet and exercise. No medications for diabetes at this time.  Appears to be doing well on atorvastatin and we will repeat her fasting lipids today to see if medication is effective.  Urine is neg for protein today. No further testing needed at this time.  She is considering pelvic floor therapy for urge incontinence but has not scheduled yet. Incontinence is not any worse.  Follow up in August for DM, HTN, and other chronic health conditions.

## 2016-10-31 NOTE — Patient Instructions (Signed)
Continue on current medications. We will call you with lab results.   Call [facility] to find out how to get your Miralax and other medications for colonoscopy preparation.  [phone]

## 2016-11-11 ENCOUNTER — Ambulatory Visit (AMBULATORY_SURGERY_CENTER): Payer: BLUE CROSS/BLUE SHIELD | Admitting: Internal Medicine

## 2016-11-11 ENCOUNTER — Encounter: Payer: Self-pay | Admitting: Internal Medicine

## 2016-11-11 VITALS — BP 129/86 | HR 67 | Temp 98.4°F | Resp 20 | Ht 63.0 in | Wt 199.0 lb

## 2016-11-11 DIAGNOSIS — D124 Benign neoplasm of descending colon: Secondary | ICD-10-CM

## 2016-11-11 DIAGNOSIS — Z1211 Encounter for screening for malignant neoplasm of colon: Secondary | ICD-10-CM

## 2016-11-11 DIAGNOSIS — K635 Polyp of colon: Secondary | ICD-10-CM

## 2016-11-11 DIAGNOSIS — Z1212 Encounter for screening for malignant neoplasm of rectum: Secondary | ICD-10-CM | POA: Diagnosis not present

## 2016-11-11 MED ORDER — SODIUM CHLORIDE 0.9 % IV SOLN: 500.0000 mL | INTRAVENOUS | Status: DC

## 2016-11-11 NOTE — Op Note (Signed)
[facility] Patient Name: Holly Hendrix Procedure Date: 11/11/2016 9:03 AM MRN: [id number] Endoscopist: Gatha Mayer , MD Age: 56 Referring MD:  Date of Birth: 1961/01/01 Gender: Female Account #: [id number] Procedure:                Colonoscopy Indications:              Screening for colorectal malignant neoplasm, This                            is the patient's first colonoscopy Medicines:                Monitored Anesthesia Care Procedure:                Pre-Anesthesia Assessment:                           - Prior to the procedure, a History and Physical                            was performed, and patient medications and                            allergies were reviewed. The patient's tolerance of                            previous anesthesia was also reviewed. The risks                            and benefits of the procedure and the sedation                            options and risks were discussed with the patient.                            All questions were answered, and informed consent                            was obtained. Prior Anticoagulants: The patient has                            taken no previous anticoagulant or antiplatelet                            agents. ASA Grade Assessment: II - A patient with                            mild systemic disease. After reviewing the risks                            and benefits, the patient was deemed in                            satisfactory condition to undergo the procedure.  After obtaining informed consent, the colonoscope                            was passed under direct vision. Throughout the                            procedure, the patient's blood pressure, pulse, and                            oxygen saturations were monitored continuously. The                            Colonoscope was introduced through the anus and                            advanced to the the cecum,  identified by                            appendiceal orifice and ileocecal valve. The                            colonoscopy was performed without difficulty. The                            patient tolerated the procedure well. The quality                            of the bowel preparation was excellent. The                            ileocecal valve, appendiceal orifice, and rectum                            were photographed. The bowel preparation used was                            Miralax. Scope In: 9:13:07 AM Scope Out: 9:30:12 AM Scope Withdrawal Time: 0 hours 13 minutes 50 seconds  Total Procedure Duration: 0 hours 17 minutes 5 seconds  Findings:                 The perianal and digital rectal examinations were                            normal.                           Two sessile polyps were found in the descending                            colon and proximal descending colon. The polyps                            were diminutive in size. These polyps were removed  with a cold biopsy forceps. Resection and retrieval                            were complete. Verification of patient                            identification for the specimen was done. Estimated                            blood loss was minimal.                           The exam was otherwise without abnormality on                            direct and retroflexion views. Complications:            No immediate complications. Estimated Blood Loss:     Estimated blood loss was minimal. Impression:               - Two diminutive polyps in the descending colon and                            in the proximal descending colon, removed with a                            cold biopsy forceps. Resected and retrieved.                           - The examination was otherwise normal on direct                            and retroflexion views. Recommendation:           - Patient has a contact number  available for                            emergencies. The signs and symptoms of potential                            delayed complications were discussed with the                            patient. Return to normal activities tomorrow.                            Written discharge instructions were provided to the                            patient.                           - Resume previous diet.                           - Continue present medications.                           -  Repeat colonoscopy is recommended. The                            colonoscopy date will be determined after pathology                            results from today's exam become available for                            review. Gatha Mayer, MD 11/11/2016 9:36:57 AM This report has been signed electronically.

## 2016-11-11 NOTE — Progress Notes (Signed)
Report to PACU, RN, vss, BBS= Clear.  

## 2016-11-11 NOTE — Progress Notes (Signed)
Pt's states no medical or surgical changes since previsit or office visit. 

## 2016-11-11 NOTE — Progress Notes (Signed)
Called to room to assist during endoscopic procedure.  Patient ID and intended procedure confirmed with present staff. Received instructions for my participation in the procedure from the performing physician.  

## 2016-11-11 NOTE — Patient Instructions (Addendum)
   I found and removed 2 small polyps. I will let you know pathology results and when to have another routine colonoscopy by mail and/or My Chart.  I appreciate the opportunity to care for you. Gatha Mayer, MD, [facility]  Polyp handout given to patient.  YOU HAD AN ENDOSCOPIC PROCEDURE TODAY AT [facility]:   Refer to the procedure report that was given to you for any specific questions about what was found during the examination.  If the procedure report does not answer your questions, please call your gastroenterologist to clarify.  If you requested that your care partner not be given the details of your procedure findings, then the procedure report has been included in a sealed envelope for you to review at your convenience later.  YOU SHOULD EXPECT: Some feelings of bloating in the abdomen. Passage of more gas than usual.  Walking can help get rid of the air that was put into your GI tract during the procedure and reduce the bloating. If you had a lower endoscopy (such as a colonoscopy or flexible sigmoidoscopy) you may notice spotting of blood in your stool or on the toilet paper. If you underwent a bowel prep for your procedure, you may not have a normal bowel movement for a few days.  Please Note:  You might notice some irritation and congestion in your nose or some drainage.  This is from the oxygen used during your procedure.  There is no need for concern and it should clear up in a day or so.  SYMPTOMS TO REPORT IMMEDIATELY:   Following lower endoscopy (colonoscopy or flexible sigmoidoscopy):  Excessive amounts of blood in the stool  Significant tenderness or worsening of abdominal pains  Swelling of the abdomen that is new, acute  Fever of 100F or higher For urgent or emergent issues, a gastroenterologist can be reached at any hour by calling [phone].   DIET:  We do recommend a small meal at first, but then you may proceed to your regular diet.  Drink  plenty of fluids but you should avoid alcoholic beverages for 24 hours.  ACTIVITY:  You should plan to take it easy for the rest of today and you should NOT DRIVE or use heavy machinery until tomorrow (because of the sedation medicines used during the test).    FOLLOW UP: Our staff will call the number listed on your records the next business day following your procedure to check on you and address any questions or concerns that you may have regarding the information given to you following your procedure. If we do not reach you, we will leave a message.  However, if you are feeling well and you are not experiencing any problems, there is no need to return our call.  We will assume that you have returned to your regular daily activities without incident.  If any biopsies were taken you will be contacted by phone or by letter within the next 1-3 weeks.  Please call us at [phone] if you have not heard about the biopsies in 3 weeks.    SIGNATURES/CONFIDENTIALITY: You and/or your care partner have signed paperwork which will be entered into your electronic medical record.  These signatures attest to the fact that that the information above on your After Visit Summary has been reviewed and is understood.  Full responsibility of the confidentiality of this discharge information lies with you and/or your care-partner.

## 2016-11-12 ENCOUNTER — Telehealth: Payer: Self-pay | Admitting: *Deleted

## 2016-11-12 NOTE — Telephone Encounter (Signed)
  Follow up Call-  Call back number 11/11/2016  Post procedure Call Back phone  # [phone]  Permission to leave phone message Yes  Some recent data might be hidden     Patient questions:  Do you have a fever, pain , or abdominal swelling? No. Pain Score  0 *  Have you tolerated food without any problems? Yes.    Have you been able to return to your normal activities? Yes.    Do you have any questions about your discharge instructions: Diet   No. Medications  No. Follow up visit  No.  Do you have questions or concerns about your Care? No.  Actions: * If pain score is 4 or above: No action needed, pain <4.

## 2016-11-15 ENCOUNTER — Encounter: Payer: Self-pay | Admitting: Internal Medicine

## 2016-11-15 NOTE — Progress Notes (Signed)
Left hyperplastic polyps Recall 2028

## 2016-11-29 ENCOUNTER — Telehealth: Payer: Self-pay | Admitting: Family Medicine

## 2016-11-29 DIAGNOSIS — E782 Mixed hyperlipidemia: Secondary | ICD-10-CM

## 2016-11-29 MED ORDER — ATORVASTATIN CALCIUM 20 MG PO TABS: 20.0000 mg | ORAL_TABLET | Freq: Every day | ORAL | 5 refills | Status: DC

## 2016-11-29 NOTE — Telephone Encounter (Signed)
Pt called requesting a refill on Atorvastatin 20 mg #90

## 2016-11-29 NOTE — Telephone Encounter (Signed)
done

## 2016-12-02 ENCOUNTER — Telehealth: Payer: Self-pay | Admitting: Family Medicine

## 2016-12-02 DIAGNOSIS — E782 Mixed hyperlipidemia: Secondary | ICD-10-CM

## 2016-12-02 MED ORDER — ATORVASTATIN CALCIUM 20 MG PO TABS: 20.0000 mg | ORAL_TABLET | Freq: Every day | ORAL | 1 refills | Status: DC

## 2016-12-02 NOTE — Telephone Encounter (Signed)
Pt called and stated that her atorvastatin is less expensive if she gets 90 days at a time. She is requesting that be sent in for #90 to [facility] pt can be reached at [phone].

## 2016-12-02 NOTE — Telephone Encounter (Signed)
Refilled #90 with 1 refill.

## 2016-12-10 ENCOUNTER — Other Ambulatory Visit: Payer: Self-pay | Admitting: Family Medicine

## 2017-01-08 NOTE — Progress Notes (Signed)
  Subjective:    Patient ID: Holly Hendrix, female    DOB: 04-11-1961, 56 y.o.   MRN: [id number]  Holly Hendrix is a 56 y.o. female who presents for follow-up of Type 2 diabetes mellitus.  Patient is not checking home blood sugars.   Home blood sugar records: patient does not check sugars How often is blood sugars being checked: not checking Current symptoms include: none. Patient denies nausea, visual disturbances, vomiting and weight loss.  Patient is checking their feet daily. Any Foot concerns (callous, ulcer, wound, thickened nails, toenail fungus, skin fungus, hammer toe): none Last dilated eye exam: never had one  Current treatments: none. Medication compliance: not on dm meds  Current diet: unhealthy. Ice cream, sweets, breads, potatoes. Sugary drinks  Current exercise: walks occasionally 1 day per week.  Known diabetic complications: none  The following portions of the patient's history were reviewed and updated as appropriate: allergies, current medications, past medical history, past social history and problem list.  ROS as in subjective above.     Objective:    Physical Exam Alert and in no distress. Normal foot exam.   Blood pressure 122/80, pulse 89, weight 199 lb 6.4 oz (90.4 kg).  Lab Review Diabetic Labs Latest Ref Rng & Units 01/09/2017 10/31/2016 08/23/2016 10/28/2013 04/30/2013  HbA1c - 6.7% - 5.9(H) 6.6(H) 6.4  Microalbumin 0.00 - 1.89 mg/dL - - - - -  Chol <200 mg/dL - 117 209(H) 172 178  HDL >50 mg/dL - 46(L) 48(L) 41 34(L)  Calc LDL <100 mg/dL - 59 141(H) 117(H) 128(H)  Triglycerides <150 mg/dL - 61 100 71 79  Creatinine 0.50 - 1.05 mg/dL - - 0.87 0.64 0.78   BP/Weight 01/09/2017 11/11/2016 10/31/2016 08/26/8117 05/24/7827  Systolic BP 562 130 865 - 784  Diastolic BP 80 86 82 - 82  Wt. (Lbs) 199.4 199 200 199 200.4  BMI 35.32 35.25 34.87 34.7 34.94   Foot/eye exam completion dates 01/09/2017  Foot Form Completion Done    Chevon  reports that she has never  smoked. She has never used smokeless tobacco. She reports that she does not drink alcohol or use drugs.     Assessment & Plan:    Controlled type 2 diabetes mellitus without complication, without long-term current use of insulin ([place]) - Plan: Microalbumin/Creatinine Ratio, Urine, HgB A1c, CBC with Differential/Platelet, Basic metabolic panel, CANCELED: POCT Urinalysis DIP (Proadvantage Device)  HYPERTENSION, BENIGN ESSENTIAL - Plan: CBC with Differential/Platelet, Basic metabolic panel  1. Rx changes: patient declines to start medication and would like to try improving diet and exercising   Hemoglobin A1c 6.7%  2. Education: Reviewed 'ABCs' of diabetes management (respective goals in parentheses):  A1C (<7), blood pressure (<130/80), and cholesterol (LDL <100). 3. Compliance at present is estimated to be fair. Efforts to improve compliance (if necessary) will be directed at dietary modifications: cut back on sugar and carbs, increased exercise and regular blood sugar monitoring: does not want to start checking blood sugars at this point. will discuss this again in 3 months. . 4. Offered to send her to nutrition counseling and she declines. Handouts given on carb counting and diabetes information.  5. She will call to schedule a diabetic eye exam.  6. microalbumin ordered.  7. BP is within goal. Continue current medication.  8. Cholesterol had improved at our last check and we will recheck fasting lipids in 3 months  9. Follow up: 3 months

## 2017-01-09 ENCOUNTER — Encounter: Payer: Self-pay | Admitting: Family Medicine

## 2017-01-09 ENCOUNTER — Ambulatory Visit (INDEPENDENT_AMBULATORY_CARE_PROVIDER_SITE_OTHER): Payer: BLUE CROSS/BLUE SHIELD | Admitting: Family Medicine

## 2017-01-09 VITALS — BP 122/80 | HR 89 | Wt 199.4 lb

## 2017-01-09 DIAGNOSIS — I10 Essential (primary) hypertension: Secondary | ICD-10-CM

## 2017-01-09 DIAGNOSIS — E119 Type 2 diabetes mellitus without complications: Secondary | ICD-10-CM | POA: Diagnosis not present

## 2017-01-09 LAB — CBC WITH DIFFERENTIAL/PLATELET
BASOS ABS: 78 {cells}/uL (ref 0–200)
Basophils Relative: 1 %
EOS ABS: 234 {cells}/uL (ref 15–500)
Eosinophils Relative: 3 %
HEMATOCRIT: 42.4 % (ref 35.0–45.0)
HEMOGLOBIN: 14 g/dL (ref 11.7–15.5)
LYMPHS ABS: 3900 {cells}/uL (ref 850–3900)
Lymphocytes Relative: 50 %
MCH: 25.9 pg — AB (ref 27.0–33.0)
MCHC: 33 g/dL (ref 32.0–36.0)
MCV: 78.5 fL — ABNORMAL LOW (ref 80.0–100.0)
MONO ABS: 546 {cells}/uL (ref 200–950)
MPV: 10.7 fL (ref 7.5–12.5)
Monocytes Relative: 7 %
NEUTROS ABS: 3042 {cells}/uL (ref 1500–7800)
NEUTROS PCT: 39 %
Platelets: 243 10*3/uL (ref 140–400)
RBC: 5.4 MIL/uL — ABNORMAL HIGH (ref 3.80–5.10)
RDW: 15.3 % — ABNORMAL HIGH (ref 11.0–15.0)
WBC: 7.8 10*3/uL (ref 4.0–10.5)

## 2017-01-09 LAB — POCT GLYCOSYLATED HEMOGLOBIN (HGB A1C): Hemoglobin A1C: 6.7

## 2017-01-09 NOTE — Patient Instructions (Addendum)
Call and schedule your diabetic eye exam.  [facility] [phone] [address] [place] [address] Zadie Rhine   You have type 2 diabetes and your hemoglobin A1c is 6.7% today. The goal is to keep your A1c under 7%.   Cut back on sugar and carbohydrates (pasta, rice, bread, sugar, sweets) Increase your physical activity level. Try walking 5 days per week, 20 -30 minutes per day at least. More is fine.   Return in 3 months and nothing to eat or drink at least 6 hours before your visit that day.    Diabetes Mellitus and Standards of Medical Care Managing diabetes (diabetes mellitus) can be complicated. Your diabetes treatment may be managed by a team of health care providers, including:  A diet and nutrition specialist (registered dietitian).  A nurse.  A certified diabetes educator (CDE).  A diabetes specialist (endocrinologist).  An eye doctor.  A primary care provider.  A dentist.  Your health care providers follow a schedule in order to help you get the best quality of care. The following schedule is a general guideline for your diabetes management plan. Your health care providers may also give you more specific instructions. HbA1c ( hemoglobin A1c) test This test provides information about blood sugar (glucose) control over the previous 2-3 months. It is used to check whether your diabetes management plan needs to be adjusted.  If you are meeting your treatment goals, this test is done at least 2 times a year.  If you are not meeting treatment goals or if your treatment goals have changed, this test is done 4 times a year.  Blood pressure test  This test is done at every routine medical visit. For most people, the goal is less than 130/80. Ask your health care provider what your goal blood pressure should be. Dental and eye exams  Visit your dentist two times a year.  If you have type 1 diabetes, get an eye exam 3-5 years after you are diagnosed, and then once a  year after your first exam. ? If you were diagnosed with type 1 diabetes as a child, get an eye exam when you are age 19 or older and have had diabetes for 3-5 years. After the first exam, you should get an eye exam once a year.  If you have type 2 diabetes, have an eye exam as soon as you are diagnosed, and then once a year after your first exam. Foot care exam  Visual foot exams are done at every routine medical visit. The exams check for cuts, bruises, redness, blisters, sores, or other problems with the feet.  A complete foot exam is done by your health care provider once a year. This exam includes an inspection of the structure and skin of your feet, and a check of the pulses and sensation in your feet. ? Type 1 diabetes: Get your first exam 3-5 years after diagnosis. ? Type 2 diabetes: Get your first exam as soon as you are diagnosed.  Check your feet every day for cuts, bruises, redness, blisters, or sores. If you have any of these or other problems that are not healing, contact your health care provider. Kidney function test ( urine microalbumin)  This test is done once a year. ? Type 1 diabetes: Get your first test 5 years after diagnosis. ? Type 2 diabetes: Get your first test as soon as you are diagnosed.  If you have chronic kidney disease (CKD), get a serum creatinine and estimated  glomerular filtration rate (eGFR) test once a year. Lipid profile (cholesterol, HDL, LDL, triglycerides)  This test should be done when you are diagnosed with diabetes, and every 5 years after the first test. If you are on medicines to lower your cholesterol, you may need to get this test done every year. ? The goal for LDL is less than 100 mg/dL (5.5 mmol/L). If you are at high risk, the goal is less than 70 mg/dL (3.9 mmol/L). ? The goal for HDL is 40 mg/dL (2.2 mmol/L) for men and 50 mg/dL(2.8 mmol/L) for women. An HDL cholesterol of 60 mg/dL (3.3 mmol/L) or higher gives some protection against  heart disease. ? The goal for triglycerides is less than 150 mg/dL (8.3 mmol/L). Immunizations  The yearly flu (influenza) vaccine is recommended for everyone 6 months or older who has diabetes.  The pneumonia (pneumococcal) vaccine is recommended for everyone 2 years or older who has diabetes. If you are 71 or older, you may get the pneumonia vaccine as a series of two separate shots.  The hepatitis B vaccine is recommended for adults shortly after they have been diagnosed with diabetes.  The Tdap (tetanus, diphtheria, and pertussis) vaccine should be given: ? According to normal childhood vaccination schedules, for children. ? Every 10 years, for adults who have diabetes.  The shingles vaccine is recommended for people who have had chicken pox and are 50 years or older. Mental and emotional health  Screening for symptoms of eating disorders, anxiety, and depression is recommended at the time of diagnosis and afterward as needed. If your screening shows that you have symptoms (you have a positive screening result), you may need further evaluation and be referred to a mental health care provider. Diabetes self-management education  Education about how to manage your diabetes is recommended at diagnosis and ongoing as needed. Treatment plan  Your treatment plan will be reviewed at every medical visit. Summary  Managing diabetes (diabetes mellitus) can be complicated. Your diabetes treatment may be managed by a team of health care providers.  Your health care providers follow a schedule in order to help you get the best quality of care.  Standards of care including having regular physical exams, blood tests, blood pressure monitoring, immunizations, screening tests, and education about how to manage your diabetes.  Your health care providers may also give you more specific instructions based on your individual health. This information is not intended to replace advice given to you by  your health care provider. Make sure you discuss any questions you have with your health care provider. Document Released: 03/03/2009 Document Revised: 02/02/2016 Document Reviewed: 02/02/2016 Elsevier Interactive Patient Education  2018 [employer].   Carbohydrate Counting for Diabetes Mellitus, Adult Carbohydrate counting is a method for keeping track of how many carbohydrates you eat. Eating carbohydrates naturally increases the amount of sugar (glucose) in the blood. Counting how many carbohydrates you eat helps keep your blood glucose within normal limits, which helps you manage your diabetes (diabetes mellitus). It is important to know how many carbohydrates you can safely have in each meal. This is different for every person. A diet and nutrition specialist (registered dietitian) can help you make a meal plan and calculate how many carbohydrates you should have at each meal and snack. Carbohydrates are found in the following foods:  Grains, such as breads and cereals.  Dried beans and soy products.  Starchy vegetables, such as potatoes, peas, and corn.  Fruit and fruit juices.  Milk  and yogurt.  Sweets and snack foods, such as cake, cookies, candy, chips, and soft drinks.  How do I count carbohydrates? There are two ways to count carbohydrates in food. You can use either of the methods or a combination of both. Reading "Nutrition Facts" on packaged food The "Nutrition Facts" list is included on the labels of almost all packaged foods and beverages in the [place] It includes:  The serving size.  Information about nutrients in each serving, including the grams (g) of carbohydrate per serving.  To use the "Nutrition Facts":  Decide how many servings you will have.  Multiply the number of servings by the number of carbohydrates per serving.  The resulting number is the total amount of carbohydrates that you will be having.  Learning standard serving sizes of other foods When  you eat foods containing carbohydrates that are not packaged or do not include "Nutrition Facts" on the label, you need to measure the servings in order to count the amount of carbohydrates:  Measure the foods that you will eat with a food scale or measuring cup, if needed.  Decide how many standard-size servings you will eat.  Multiply the number of servings by 15. Most carbohydrate-rich foods have about 15 g of carbohydrates per serving. ? For example, if you eat 8 oz (170 g) of strawberries, you will have eaten 2 servings and 30 g of carbohydrates (2 servings x 15 g = 30 g).  For foods that have more than one food mixed, such as soups and casseroles, you must count the carbohydrates in each food that is included.  The following list contains standard serving sizes of common carbohydrate-rich foods. Each of these servings has about 15 g of carbohydrates:   hamburger bun or  English muffin.   oz (15 mL) syrup.   oz (14 g) jelly.  1 slice of bread.  1 six-inch tortilla.  3 oz (85 g) cooked rice or pasta.  4 oz (113 g) cooked dried beans.  4 oz (113 g) starchy vegetable, such as peas, corn, or potatoes.  4 oz (113 g) hot cereal.  4 oz (113 g) mashed potatoes or  of a large baked potato.  4 oz (113 g) canned or frozen fruit.  4 oz (120 mL) fruit juice.  4-6 crackers.  6 chicken nuggets.  6 oz (170 g) unsweetened dry cereal.  6 oz (170 g) plain fat-free yogurt or yogurt sweetened with artificial sweeteners.  8 oz (240 mL) milk.  8 oz (170 g) fresh fruit or one small piece of fruit.  24 oz (680 g) popped popcorn.  Example of carbohydrate counting Sample meal  3 oz (85 g) chicken breast.  6 oz (170 g) brown rice.  4 oz (113 g) corn.  8 oz (240 mL) milk.  8 oz (170 g) strawberries with sugar-free whipped topping. Carbohydrate calculation 1. Identify the foods that contain carbohydrates: ? Rice. ? Corn. ? Milk. ? Strawberries. 2. Calculate how many  servings you have of each food: ? 2 servings rice. ? 1 serving corn. ? 1 serving milk. ? 1 serving strawberries. 3. Multiply each number of servings by 15 g: ? 2 servings rice x 15 g = 30 g. ? 1 serving corn x 15 g = 15 g. ? 1 serving milk x 15 g = 15 g. ? 1 serving strawberries x 15 g = 15 g. 4. Add together all of the amounts to find the total grams of carbohydrates eaten: ?  30 g + 15 g + 15 g + 15 g = 75 g of carbohydrates total. This information is not intended to replace advice given to you by your health care provider. Make sure you discuss any questions you have with your health care provider. Document Released: 05/06/2005 Document Revised: 11/24/2015 Document Reviewed: 10/18/2015 Elsevier Interactive Patient Education  [employer].

## 2017-01-10 LAB — BASIC METABOLIC PANEL
BUN: 12 mg/dL (ref 7–25)
CHLORIDE: 102 mmol/L (ref 98–110)
CO2: 23 mmol/L (ref 20–32)
CREATININE: 0.77 mg/dL (ref 0.50–1.05)
Calcium: 9.6 mg/dL (ref 8.6–10.4)
GLUCOSE: 95 mg/dL (ref 65–99)
POTASSIUM: 3.6 mmol/L (ref 3.5–5.3)
Sodium: 141 mmol/L (ref 135–146)

## 2017-01-13 LAB — MICROALBUMIN / CREATININE URINE RATIO
Creatinine, Urine: 101 mg/dL (ref 20–320)
MICROALB UR: 0.4 mg/dL
Microalb Creat Ratio: 4 mcg/mg creat (ref <30)

## 2017-03-07 ENCOUNTER — Other Ambulatory Visit: Payer: Self-pay | Admitting: Family Medicine

## 2017-04-13 NOTE — Progress Notes (Signed)
Subjective:    Patient ID: Holly Hendrix, female    DOB: [birth year], 56 y.o.   MRN: [id number]  Holly Hendrix is a 56 y.o. female who presents for follow-up of Type 2 diabetes mellitus.  She has been taking cold medication for the past week.  States her BP at home was in a "good range" except for lately with cold medication. She is aware that her BP is elevated today.  Takes Aleve 2-3 days per week for generalized aches and pains.   Patient is not checking home blood sugars.   Home blood sugar records: patient does not check sugars How often is blood sugars being checked: none Current symptoms include: none. Patient denies foot ulcerations, increased appetite, nausea, polydipsia, polyuria, visual disturbances and vomiting.  Patient is checking their feet daily. Any Foot concerns (callous, ulcer, wound, thickened nails, toenail fungus, skin fungus, hammer toe): none Last dilated eye exam: 5 years ago.   Current treatments: no recent interventions. Medication compliance: good  Current diet: in general, an "unhealthy" diet Current exercise: none and walks a lot at work but nothing outside of her job Known diabetic complications: none  The following portions of the patient's history were reviewed and updated as appropriate: allergies, current medications, past medical history, past social history and problem list.  ROS as in subjective above.     Objective:    Physical Exam Alert and in no distress otherwise not examined.  Blood pressure (!) 148/100, pulse 88, weight 196 lb 12.8 oz (89.3 kg).  Lab Review Diabetic Labs Latest Ref Rng & Units 04/14/2017 01/09/2017 10/31/2016 08/23/2016 10/28/2013  HbA1c - 6.2 6.7% - 5.9(H) 6.6(H)  Microalbumin Not estab mg/dL - 0.4 - - -  Micro/Creat Ratio <30 mcg/mg creat - 4 - - -  Chol <200 mg/dL - - 117 209(H) 172  HDL >50 mg/dL - - 46(L) 48(L) 41  Calc LDL <100 mg/dL - - 59 141(H) 117(H)  Triglycerides <150 mg/dL - - 61 100 71  Creatinine 0.50  - 1.05 mg/dL - 0.77 - 0.87 0.64   BP/Weight 04/14/2017 01/09/2017 11/11/2016 10/31/2016 4/40/1027  Systolic BP 253 664 403 474 -  Diastolic BP 259 80 86 82 -  Wt. (Lbs) 196.8 199.4 199 200 199  BMI 34.86 35.32 35.25 34.87 34.7   Foot/eye exam completion dates 01/09/2017  Foot Form Completion Done    Holly Hendrix  reports that  has never smoked. she has never used smokeless tobacco. She reports that she does not drink alcohol or use drugs.     Assessment & Plan:    Controlled type 2 diabetes mellitus without complication, without long-term current use of insulin ([place]) - Plan: HgB A1c  HYPERTENSION, BENIGN ESSENTIAL  Dyslipidemia  1. Rx changes: none A1c is now back in prediabetes range at 6.2%. This improved from 6.7% in August.  2. She is aware that her BP is elevated today. She thinks this is related to the cold medication she is taking. No recent BP checks at home but she does have a machine to check. Asymptomatic.  3. Reports taking atorvastatin without any difficulties. Lipid panel in goal range last visit. Continue on current statin.  4. Education: Reviewed 'ABCs' of diabetes management (respective goals in parentheses):  A1C (<7), blood pressure (<130/80), and cholesterol (LDL <100). 5. Compliance at present is estimated to be good. Efforts to improve compliance (if necessary) will be directed at dietary modifications: cut back on sugar and carbs. 6. Follow up: she will stop her  cold medication and keep an eye on her BP at home.  she will call me in 3 days and let me know what her BP is running. Then follow up in 4 weeks for a nurse visit to have her BP checked here in our office and she will bring in her readings and BP machine.

## 2017-04-14 ENCOUNTER — Ambulatory Visit: Payer: BLUE CROSS/BLUE SHIELD | Admitting: Family Medicine

## 2017-04-14 ENCOUNTER — Encounter: Payer: Self-pay | Admitting: Family Medicine

## 2017-04-14 VITALS — BP 148/100 | HR 88 | Wt 196.8 lb

## 2017-04-14 DIAGNOSIS — I10 Essential (primary) hypertension: Secondary | ICD-10-CM | POA: Diagnosis not present

## 2017-04-14 DIAGNOSIS — E785 Hyperlipidemia, unspecified: Secondary | ICD-10-CM | POA: Diagnosis not present

## 2017-04-14 DIAGNOSIS — E119 Type 2 diabetes mellitus without complications: Secondary | ICD-10-CM | POA: Diagnosis not present

## 2017-04-14 LAB — POCT GLYCOSYLATED HEMOGLOBIN (HGB A1C): Hemoglobin A1C: 6.2

## 2017-04-14 NOTE — Patient Instructions (Addendum)
Stop taking the cold medication. You can use Claritin or the generic for nasal drainage if needed.  Check your BP at home and call me Thursday with your readings. Your BP today is 150/98 and this is too high.  Goal BP is <130/80.   Your hemoglobin A1c is 6.2% today and this is back in prediabetes range so this is good.   Cut back on "junk food" and sugar.   Continue on your current medication.   Return  would for a nurse visit in 4 weeks just for a BP check. Bring in your BP machine and your readings.   Follow up with me for your annual physical and diabetes check in April.

## 2017-04-17 ENCOUNTER — Telehealth: Payer: Self-pay | Admitting: Family Medicine

## 2017-04-17 NOTE — Telephone Encounter (Signed)
Have her get a blood pressure cuff that works when she can, or go to the pharmacy or a location where she can randomly spot check her blood pressure.

## 2017-04-17 NOTE — Telephone Encounter (Signed)
Advised pt of info.

## 2017-04-17 NOTE — Telephone Encounter (Signed)
Pt called and stated that she was to call back with her bp readings. She wanted to let you know her bp machine is not working and she hasn't been anywhere to get it taken.

## 2017-05-09 ENCOUNTER — Other Ambulatory Visit: Payer: BLUE CROSS/BLUE SHIELD

## 2017-06-18 ENCOUNTER — Encounter: Payer: Self-pay | Admitting: Family Medicine

## 2017-06-18 ENCOUNTER — Ambulatory Visit: Payer: BC Managed Care – PPO | Admitting: Family Medicine

## 2017-06-18 VITALS — BP 128/82 | HR 80 | Temp 98.5°F | Ht 63.0 in | Wt 199.0 lb

## 2017-06-18 DIAGNOSIS — E782 Mixed hyperlipidemia: Secondary | ICD-10-CM

## 2017-06-18 DIAGNOSIS — J0100 Acute maxillary sinusitis, unspecified: Secondary | ICD-10-CM | POA: Diagnosis not present

## 2017-06-18 MED ORDER — AMOXICILLIN 500 MG PO TABS: 1000.0000 mg | ORAL_TABLET | Freq: Two times a day (BID) | ORAL | 0 refills | Status: DC

## 2017-06-18 MED ORDER — ATORVASTATIN CALCIUM 20 MG PO TABS: 20.0000 mg | ORAL_TABLET | Freq: Every day | ORAL | 0 refills | Status: DC

## 2017-06-18 NOTE — Progress Notes (Signed)
Chief Complaint  Patient presents with  . Cough    started around Christmas, got a little better but never cleared up. Mucus is dark yellow in color. Not sure if she has had any fevers, hasn't checked but has felt hot from time to time.   . Medication Refill    needs refill on atorvastatin.    She has been sick with upper respiratory symptoms for about a month.  She thought it had gotten better, but worse again in the last 2 weeks.  Some hot/cold, but no known fever or chills (she blames the [employer]ick[employer] she used for feeling this way).  She is complaining of cough and sore throat.  She has spasms of cough, sometimes has almost thrown up.  Denies significant sinus congestion or postnasal drainage. Denies shortness of breath, tightness, wheezing.  Nasal drainage is "gross", discolored, throughout the day.  Cough is sometimes wet (rare), is mostly dry and hacky.  She has taken various OTC meds, including Equate severe cold and flu, Mucinex, [employer] cold and flu.  The medications seem to ease it some, temporarily.    +sick contacts at work  [facility], [place], [place] reviewed  Outpatient Encounter Medications as of 06/18/2017  Medication Sig Note  . lisinopril-hydrochlorothiazide (PRINZIDE,ZESTORETIC) 20-25 MG tablet TAKE 1 TABLET BY MOUTH ONCE DAILY   . atorvastatin (LIPITOR) 20 MG tablet Take 1 tablet (20 mg total) by mouth daily.   . Naproxen Sodium (ALEVE PO) Take 1-2 tablets by mouth as needed.    . [DISCONTINUED] atorvastatin (LIPITOR) 20 MG tablet Take 1 tablet (20 mg total) by mouth daily. (Patient not taking: Reported on 06/18/2017) 06/18/2017: Ran out a few days ago   Facility-Administered Encounter Medications as of 06/18/2017  Medication  . 0.9 %  sodium chloride infusion   No Known Allergies  ROS: denies headaches, dizziness, syncope.  Rare slight headache (not sinus). Denies nausea, vomiting, diarrhea, rashes, bleeding, bruising, urinary problems. +URI symptoms per HPI  PHYSICAL EXAM:  BP  128/82   Pulse 80   Temp 98.5 F (36.9 C) (Tympanic)   Ht 5\' 3"  (1.6 m)   Wt 199 lb (90.3 kg)   BMI 35.25 kg/m   Well appearing female, with frequent dry cough and throat clearing, in no distress HEENT: PERRL, EOMI, conjunctiva and sclera are clear. TM's and EAC's normal. Nasal mucosa is moderately edematous, severe on the right. Slightly erythema. No purulence. Tender L>R maxillary sinus. OP is clear Neck: no lymphadenopathy Heart: regular rate and rhythm, no murmur Lungs: clear bilaterally, no wheezes, rales, ronchi. Skin: normal turgor, no rash Psych:normal mood, affect, hygiene and grooming Neuro: alert and oriented, cranial nerves intact.   ASSESSMENT/PLAN:  Acute non-recurrent maxillary sinusitis - Plan: amoxicillin (AMOXIL) 500 MG tablet  Mixed hyperlipidemia - Plan: atorvastatin (LIPITOR) 20 MG tablet  Refilled lipitor--has f/u scheduled in April with Vickie.   Drink plenty of water. Take Coricidin HBP to help with nasal congestion without raising your blood prssure.  You should avoid decongestant as these can raise blood pressure. I recommend taking guaifenesin---the expectorant found in Mucinex and Robitussin (but don't get a version that contains many ingredient, unless you check that the ingredients don't overlap with anything in the coricidin-such as acetaminophen and dextromethophan).--I recommend getting PLAIN mucinex/guaifenesin (no other ingredients). Take the antibiotics twice daily (2 pills together each time) for the full 10 days. Contact us in 5-7 days if you are getting worse rather than better, or in 10 days if you aren't  completely better.

## 2017-06-18 NOTE — Patient Instructions (Signed)
  Drink plenty of water. Take Coricidin HBP to help with nasal congestion without raising your blood prssure.  You should avoid decongestant as these can raise blood pressure. I recommend taking guaifenesin---the expectorant found in Mucinex and Robitussin (but don't get a version that contains many ingredient, unless you check that the ingredients don't overlap with anything in the coricidin-such as acetaminophen and dextromethophan).--I recommend getting PLAIN mucinex/guaifenesin (no other ingredients). Take the antibiotics twice daily (2 pills together each time) for the full 10 days. Contact us in 5-7 days if you are getting worse rather than better, or in 10 days if you aren't completely better.

## 2017-08-11 ENCOUNTER — Other Ambulatory Visit: Payer: Self-pay | Admitting: Family Medicine

## 2017-08-11 DIAGNOSIS — Z1231 Encounter for screening mammogram for malignant neoplasm of breast: Secondary | ICD-10-CM

## 2017-08-26 NOTE — Progress Notes (Signed)
Subjective:    Patient ID: Holly Hendrix, female    DOB: [birth year], 57 y.o.   MRN: [id number]  HPI Chief Complaint  Patient presents with  . other    CPE fasting    She is here for a complete physical exam. No concerns or complaints   Last Hgb A1c 6.2% in 03/2017. Diabetes controlled with diet and exercise.   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  Excerise: nothing   Immunizations: reports having Tdap at her job in 2016. She will get these records. Declines pneumonia shot today.   Health maintenance:  Mammogram: 09/2016 Colonoscopy: 10/2016 and due again in 2028 Last Gynecological Exam: 08/2016 and pap smear normal.  Last Menstrual cycle: years ago  DEXA: never  Last Dental Exam: overdue  Last Eye Exam: overdue   Depression screen [facility] 2/9 08/27/2017 08/23/2016  Decreased Interest 0 0  Down, Depressed, Hopeless 0 0  PHQ - 2 Score 0 0    Wears seatbelt always, smoke detectors in home and functioning, does not text while driving and feels safe in home environment.   Reviewed allergies, medications, past medical, surgical, family, and social history.   Review of Systems Review of Systems Constitutional: -fever, -chills, -sweats, -unexpected weight change,-fatigue ENT: -runny nose, -ear pain, -sore throat Cardiology:  -chest pain, -palpitations, -edema Respiratory: -cough, -shortness of breath, -wheezing Gastroenterology: -abdominal pain, -nausea, -vomiting, -diarrhea, -constipation  Hematology: -bleeding or bruising problems Musculoskeletal: -arthralgias, -myalgias, -joint swelling, -back pain Ophthalmology: -vision changes Urology: -dysuria, -difficulty urinating, -hematuria, -urinary frequency, -urgency Neurology: -headache, -weakness, -tingling, -numbness       Objective:   Physical Exam BP 128/86 (BP Location: Left Arm, Patient Position: Sitting)   Pulse 93   Temp 97.9 F (36.6  C)   Resp 16   Ht 5\' 3"  (1.6 m)   Wt 204 lb 9.6 oz (92.8 kg)   SpO2 95%   BMI 36.24 kg/m   General Appearance:    Alert, cooperative, no distress, appears stated age  Head:    Normocephalic, without obvious abnormality, atraumatic  Eyes:    PERRL, conjunctiva/corneas clear, EOM's intact, fundi    benign  Ears:    Normal TM's and external ear canals  Nose:   Nares normal, mucosa normal, no drainage or sinus   tenderness  Throat:   Lips, mucosa, and tongue normal; poor dental hygiene  Neck:   Supple, no lymphadenopathy;  Thyroid is enlarged, no palpable nodules, non tender: no carotid   bruit or JVD  Back:    Spine nontender, no curvature, ROM normal, no CVA     tenderness  Lungs:     Clear to auscultation bilaterally without wheezes, rales or     ronchi; respirations unlabored  Chest Wall:    No tenderness or deformity   Heart:    Regular rate and rhythm, S1 and S2 normal, no murmur, rub   or gallop  Breast Exam:    Declines. Mammogram ordered  Abdomen:     Soft, non-tender, nondistended, normoactive bowel sounds,    no masses, no hepatosplenomegaly  Genitalia:    Refuses- pap smear up to date     Extremities:   No clubbing, cyanosis or edema  Pulses:   2+ and symmetric all extremities  Skin:   Skin color, texture, turgor normal, no rashes or lesions  Lymph nodes:   Cervical, supraclavicular, and axillary nodes normal  Neurologic:  CNII-XII intact, normal strength, sensation and gait; reflexes 2+ and symmetric throughout          Psych:   Normal mood, affect, hygiene and grooming.    Urinalysis dipstick: leuk 2+, trace blood on dipstick (culture ordered)        Assessment & Plan:  Routine general medical examination at a health care facility - Plan: CBC with Differential/Platelet, Comprehensive metabolic panel, POCT Urinalysis DIP (Proadvantage Device)  HYPERTENSION, BENIGN ESSENTIAL - Plan: CBC with Differential/Platelet, Comprehensive metabolic panel  Controlled type 2  diabetes mellitus without complication, without long-term current use of insulin (HCC) - Plan: CBC with Differential/Platelet, Comprehensive metabolic panel, TSH, T4, free  Dyslipidemia - Plan: Lipid panel  Class 2 obesity due to excess calories without serious comorbidity with body mass index (BMI) of 36.0 to 36.9 in adult - Plan: TSH, Lipid panel, POCT glycosylated hemoglobin (Hb A1C)  Enlarged thyroid - Plan: TSH, T4, free  Estrogen deficiency - Plan: DG Bone Density  Vaccine counseling  Hematuria, unspecified type - Plan: CANCELED: Urine Microscopic  Leukocytes in urine - Plan: Urine Culture  She appears to be doing well overall.  Diabetes is well controlled, Hgb A1c 6.2%. She is not on medication and she declines to start on medication today again. Counseling done on diet and exercise for HTN, diabetes and weight loss.  She is fasting and we will check a lipid panel.  Vaccine counseling done and she declines pneumonia vaccine. States Tdap is up to date and she will get this record from her employer.  Mammogram and bone density ordered. This will be her first DEXA.  Will check urine culture due to leukocytes and hematuria.  Thyroid is enlarged. Will check TSH, free T4 and consider thyroid US.  She will call and schedule a diabetic eye exam. She has never had one.  Follow up pending labs.

## 2017-08-27 ENCOUNTER — Ambulatory Visit: Payer: BC Managed Care – PPO | Admitting: Family Medicine

## 2017-08-27 ENCOUNTER — Encounter: Payer: Self-pay | Admitting: Family Medicine

## 2017-08-27 VITALS — BP 128/86 | HR 93 | Temp 97.9°F | Resp 16 | Ht 63.0 in | Wt 204.6 lb

## 2017-08-27 DIAGNOSIS — Z6836 Body mass index (BMI) 36.0-36.9, adult: Secondary | ICD-10-CM

## 2017-08-27 DIAGNOSIS — E785 Hyperlipidemia, unspecified: Secondary | ICD-10-CM | POA: Diagnosis not present

## 2017-08-27 DIAGNOSIS — I10 Essential (primary) hypertension: Secondary | ICD-10-CM | POA: Diagnosis not present

## 2017-08-27 DIAGNOSIS — E2839 Other primary ovarian failure: Secondary | ICD-10-CM | POA: Diagnosis not present

## 2017-08-27 DIAGNOSIS — Z7189 Other specified counseling: Secondary | ICD-10-CM

## 2017-08-27 DIAGNOSIS — Z0000 Encounter for general adult medical examination without abnormal findings: Secondary | ICD-10-CM | POA: Diagnosis not present

## 2017-08-27 DIAGNOSIS — R319 Hematuria, unspecified: Secondary | ICD-10-CM

## 2017-08-27 DIAGNOSIS — E119 Type 2 diabetes mellitus without complications: Secondary | ICD-10-CM | POA: Diagnosis not present

## 2017-08-27 DIAGNOSIS — E6609 Other obesity due to excess calories: Secondary | ICD-10-CM

## 2017-08-27 DIAGNOSIS — Z7185 Encounter for immunization safety counseling: Secondary | ICD-10-CM

## 2017-08-27 DIAGNOSIS — E049 Nontoxic goiter, unspecified: Secondary | ICD-10-CM | POA: Insufficient documentation

## 2017-08-27 DIAGNOSIS — R82998 Other abnormal findings in urine: Secondary | ICD-10-CM

## 2017-08-27 LAB — POCT URINALYSIS DIP (PROADVANTAGE DEVICE)
BILIRUBIN UA: NEGATIVE
BILIRUBIN UA: NEGATIVE mg/dL
GLUCOSE UA: NEGATIVE mg/dL
Nitrite, UA: NEGATIVE
Protein Ur, POC: NEGATIVE mg/dL
SPECIFIC GRAVITY, URINE: 1.03
Urobilinogen, Ur: 3.5
pH, UA: 6 (ref 5.0–8.0)

## 2017-08-27 LAB — POCT GLYCOSYLATED HEMOGLOBIN (HGB A1C): Hemoglobin A1C: 6.4

## 2017-08-27 NOTE — Patient Instructions (Addendum)
Please find out when was your last tetanus shot and let me know.   Call the breast center and schedule your bone density test on the same day as your mammogram if possible.   Call and schedule a diabetic eye exam at the [facility] [phone]. The address is [address].   Call and schedule a dental appointment.   Preventative Care for Adults - Female      MAINTAIN REGULAR HEALTH EXAMS:  A routine yearly physical is a good way to check in with your primary care provider about your health and preventive screening. It is also an opportunity to share updates about your health and any concerns you have, and receive a thorough all-over exam.   Most health insurance companies pay for at least some preventative services.  Check with your health plan for specific coverages.  WHAT PREVENTATIVE SERVICES DO WOMEN NEED?  Adult women should have their weight and blood pressure checked regularly.   Women age 49 and older should have their cholesterol levels checked regularly.  Women should be screened for cervical cancer with a Pap smear and pelvic exam beginning at either age 59, or 3 years after they become sexually activity.    Breast cancer screening generally begins at age 14 with a mammogram and breast exam by your primary care provider.    Beginning at age 44 and continuing to age 64, women should be screened for colorectal cancer.  Certain people may need continued testing until age 27.  Updating vaccinations is part of preventative care.  Vaccinations help protect against diseases such as the flu.  Osteoporosis is a disease in which the bones lose minerals and strength as we age. Women ages 62 and over should discuss this with their caregivers, as should women after menopause who have other risk factors.  Lab tests are generally done as part of preventative care to screen for anemia and blood disorders, to screen for problems with the kidneys  and liver, to screen for bladder problems, to check blood sugar, and to check your cholesterol level.  Preventative services generally include counseling about diet, exercise, avoiding tobacco, drugs, excessive alcohol consumption, and sexually transmitted infections.    GENERAL RECOMMENDATIONS FOR GOOD HEALTH:  Healthy diet:  Eat a variety of foods, including fruit, vegetables, animal or vegetable protein, such as meat, fish, chicken, and eggs, or beans, lentils, tofu, and grains, such as rice.  Drink plenty of water daily.  Decrease saturated fat in the diet, avoid lots of red meat, processed foods, sweets, fast foods, and fried foods.  Exercise:  Aerobic exercise helps maintain good heart health. At least 30-40 minutes of moderate-intensity exercise is recommended. For example, a brisk walk that increases your heart rate and breathing. This should be done on most days of the week.   Find a type of exercise or a variety of exercises that you enjoy so that it becomes a part of your daily life.  Examples are running, walking, swimming, water aerobics, and biking.  For motivation and support, explore group exercise such as aerobic class, spin class, Zumba, Yoga,or  martial arts, etc.    Set exercise goals for yourself, such as a certain weight goal, walk or run in a race such as a 5k walk/run.  Speak to your primary care provider about exercise goals.  Disease prevention:  If you smoke or chew tobacco, find out from your caregiver how to quit. It can literally save  your life, no matter how long you have been a tobacco user. If you do not use tobacco, never begin.   Maintain a healthy diet and normal weight. Increased weight leads to problems with blood pressure and diabetes.   The Body Mass Index or BMI is a way of measuring how much of your body is fat. Having a BMI above 27 increases the risk of heart disease, diabetes, hypertension, stroke and other problems related to obesity. Your  caregiver can help determine your BMI and based on it develop an exercise and dietary program to help you achieve or maintain this important measurement at a healthful level.  High blood pressure causes heart and blood vessel problems.  Persistent high blood pressure should be treated with medicine if weight loss and exercise do not work.   Fat and cholesterol leaves deposits in your arteries that can block them. This causes heart disease and vessel disease elsewhere in your body.  If your cholesterol is found to be high, or if you have heart disease or certain other medical conditions, then you may need to have your cholesterol monitored frequently and be treated with medication.   Ask if you should have a cardiac stress test if your history suggests this. A stress test is a test done on a treadmill that looks for heart disease. This test can find disease prior to there being a problem.  Menopause can be associated with physical symptoms and risks. Hormone replacement therapy is available to decrease these. You should talk to your caregiver about whether starting or continuing to take hormones is right for you.   Osteoporosis is a disease in which the bones lose minerals and strength as we age. This can result in serious bone fractures. Risk of osteoporosis can be identified using a bone density scan. Women ages 63 and over should discuss this with their caregivers, as should women after menopause who have other risk factors. Ask your caregiver whether you should be taking a calcium supplement and Vitamin D, to reduce the rate of osteoporosis.   Avoid drinking alcohol in excess (more than two drinks per day).  Avoid use of street drugs. Do not share needles with anyone. Ask for professional help if you need assistance or instructions on stopping the use of alcohol, cigarettes, and/or drugs.  Brush your teeth twice a day with fluoride toothpaste, and floss once a day. Good oral hygiene prevents tooth  decay and gum disease. The problems can be painful, unattractive, and can cause other health problems. Visit your dentist for a routine oral and dental check up and preventive care every 6-12 months.   Look at your skin regularly.  Use a mirror to look at your back. Notify your caregivers of changes in moles, especially if there are changes in shapes, colors, a size larger than a pencil eraser, an irregular border, or development of new moles.  Safety:  Use seatbelts 100% of the time, whether driving or as a passenger.  Use safety devices such as hearing protection if you work in environments with loud noise or significant background noise.  Use safety glasses when doing any work that could send debris in to the eyes.  Use a helmet if you ride a bike or motorcycle.  Use appropriate safety gear for contact sports.  Talk to your caregiver about gun safety.  Use sunscreen with a SPF (or skin protection factor) of 15 or greater.  Lighter skinned people are at a greater risk of skin cancer.  Don't forget to also wear sunglasses in order to protect your eyes from too much damaging sunlight. Damaging sunlight can accelerate cataract formation.   Practice safe sex. Use condoms. Condoms are used for birth control and to help reduce the spread of sexually transmitted infections (or STIs).  Some of the STIs are gonorrhea (the clap), chlamydia, syphilis, trichomonas, herpes, HPV (human papilloma virus) and HIV (human immunodeficiency virus) which causes AIDS. The herpes, HIV and HPV are viral illnesses that have no cure. These can result in disability, cancer and death.   Keep carbon monoxide and smoke detectors in your home functioning at all times. Change the batteries every 6 months or use a model that plugs into the wall.   Vaccinations:  Stay up to date with your tetanus shots and other required immunizations. You should have a booster for tetanus every 10 years. Be sure to get your flu shot every year,  since 5%-20% of the [place] population comes down with the flu. The flu vaccine changes each year, so being vaccinated once is not enough. Get your shot in the fall, before the flu season peaks.   Other vaccines to consider:  Human Papilloma Virus or HPV causes cancer of the cervix, and other infections that can be transmitted from person to person. There is a vaccine for HPV, and females should get immunized between the ages of 2 and 55. It requires a series of 3 shots.   Pneumococcal vaccine to protect against certain types of pneumonia.  This is normally recommended for adults age 72 or older.  However, adults younger than 59 years old with certain underlying conditions such as diabetes, heart or lung disease should also receive the vaccine.  Shingles vaccine to protect against Varicella Zoster if you are older than age 31, or younger than 59 years old with certain underlying illness.  Hepatitis A vaccine to protect against a form of infection of the liver by a virus acquired from food.  Hepatitis B vaccine to protect against a form of infection of the liver by a virus acquired from blood or body fluids, particularly if you work in health care.  If you plan to travel internationally, check with your local health department for specific vaccination recommendations.  Cancer Screening:  Breast cancer screening is essential to preventive care for women. All women age 33 and older should perform a breast self-exam every month. At age 23 and older, women should have their caregiver complete a breast exam each year. Women at ages 9 and older should have a mammogram (x-ray film) of the breasts. Your caregiver can discuss how often you need mammograms.    Cervical cancer screening includes taking a Pap smear (sample of cells examined under a microscope) from the cervix (end of the uterus). It also includes testing for HPV (Human Papilloma Virus, which can cause cervical cancer). Screening and a pelvic  exam should begin at age 25, or 3 years after a woman becomes sexually active. Screening should occur every year, with a Pap smear but no HPV testing, up to age 69. After age 92, you should have a Pap smear every 3 years with HPV testing, if no HPV was found previously.   Most routine colon cancer screening begins at the age of 38. On a yearly basis, doctors may provide special easy to use take-home tests to check for hidden blood in the stool. Sigmoidoscopy or colonoscopy can detect the earliest forms of colon cancer and is life saving. These tests  use a small camera at the end of a tube to directly examine the colon. Speak to your caregiver about this at age 11, when routine screening begins (and is repeated every 5 years unless early forms of pre-cancerous polyps or small growths are found).

## 2017-08-28 LAB — CBC WITH DIFFERENTIAL/PLATELET
Basophils Absolute: 0.1 x10E3/uL (ref 0.0–0.2)
Basos: 1 %
EOS (ABSOLUTE): 0.2 x10E3/uL (ref 0.0–0.4)
Eos: 3 %
Hematocrit: 42.4 % (ref 34.0–46.6)
Hemoglobin: 14 g/dL (ref 11.1–15.9)
Immature Grans (Abs): 0 x10E3/uL (ref 0.0–0.1)
Immature Granulocytes: 0 %
Lymphocytes Absolute: 3.8 x10E3/uL — ABNORMAL HIGH (ref 0.7–3.1)
Lymphs: 53 %
MCH: 26.2 pg — ABNORMAL LOW (ref 26.6–33.0)
MCHC: 33 g/dL (ref 31.5–35.7)
MCV: 79 fL (ref 79–97)
Monocytes Absolute: 0.4 x10E3/uL (ref 0.1–0.9)
Monocytes: 5 %
Neutrophils Absolute: 2.8 x10E3/uL (ref 1.4–7.0)
Neutrophils: 38 %
Platelets: 254 x10E3/uL (ref 150–379)
RBC: 5.34 x10E6/uL — ABNORMAL HIGH (ref 3.77–5.28)
RDW: 15.4 % (ref 12.3–15.4)
WBC: 7.2 x10E3/uL (ref 3.4–10.8)

## 2017-08-28 LAB — TSH: TSH: 1.18 u[IU]/mL (ref 0.450–4.500)

## 2017-08-28 LAB — LIPID PANEL
CHOLESTEROL TOTAL: 124 mg/dL (ref 100–199)
Chol/HDL Ratio: 2.5 ratio (ref 0.0–4.4)
HDL: 49 mg/dL (ref >39)
LDL CALC: 62 mg/dL (ref 0–99)
TRIGLYCERIDES: 64 mg/dL (ref 0–149)
VLDL CHOLESTEROL CAL: 13 mg/dL (ref 5–40)

## 2017-08-28 LAB — COMPREHENSIVE METABOLIC PANEL
ALBUMIN: 4.1 g/dL (ref 3.5–5.5)
ALT: 9 IU/L (ref 0–32)
AST: 14 IU/L (ref 0–40)
Albumin/Globulin Ratio: 1.4 (ref 1.2–2.2)
Alkaline Phosphatase: 62 IU/L (ref 39–117)
BILIRUBIN TOTAL: 0.5 mg/dL (ref 0.0–1.2)
BUN / CREAT RATIO: 18 (ref 9–23)
BUN: 12 mg/dL (ref 6–24)
CO2: 25 mmol/L (ref 20–29)
CREATININE: 0.66 mg/dL (ref 0.57–1.00)
Calcium: 9.4 mg/dL (ref 8.7–10.2)
Chloride: 101 mmol/L (ref 96–106)
GFR calc non Af Amer: 98 mL/min/{1.73_m2} (ref >59)
GFR, EST AFRICAN AMERICAN: 113 mL/min/{1.73_m2} (ref >59)
GLOBULIN, TOTAL: 3 g/dL (ref 1.5–4.5)
GLUCOSE: 86 mg/dL (ref 65–99)
Potassium: 3.6 mmol/L (ref 3.5–5.2)
Sodium: 140 mmol/L (ref 134–144)
TOTAL PROTEIN: 7.1 g/dL (ref 6.0–8.5)

## 2017-08-28 LAB — T4, FREE: Free T4: 1.17 ng/dL (ref 0.82–1.77)

## 2017-08-28 NOTE — Addendum Note (Signed)
Addended by: Girtha Rm on: 08/28/2017 12:19 PM   Modules accepted: Level of Service

## 2017-08-29 LAB — URINE CULTURE

## 2017-09-08 ENCOUNTER — Other Ambulatory Visit: Payer: Self-pay | Admitting: Family Medicine

## 2017-09-19 ENCOUNTER — Ambulatory Visit: Payer: BLUE CROSS/BLUE SHIELD

## 2017-10-15 ENCOUNTER — Ambulatory Visit
Admission: RE | Admit: 2017-10-15 | Discharge: 2017-10-15 | Disposition: A | Discharge status: Home / Self Care | Payer: BLUE CROSS/BLUE SHIELD | Source: Ambulatory Visit | Attending: Family Medicine | Admitting: Family Medicine

## 2017-10-15 ENCOUNTER — Ambulatory Visit
Admission: RE | Admit: 2017-10-15 | Discharge: 2017-10-15 | Disposition: A | Discharge status: Home / Self Care | Payer: BC Managed Care – PPO | Source: Ambulatory Visit | Attending: Family Medicine | Admitting: Family Medicine

## 2017-10-15 DIAGNOSIS — Z1231 Encounter for screening mammogram for malignant neoplasm of breast: Secondary | ICD-10-CM

## 2017-10-15 DIAGNOSIS — E2839 Other primary ovarian failure: Secondary | ICD-10-CM

## 2017-12-19 ENCOUNTER — Other Ambulatory Visit: Payer: Self-pay | Admitting: Family Medicine

## 2017-12-19 DIAGNOSIS — E782 Mixed hyperlipidemia: Secondary | ICD-10-CM

## 2018-03-09 ENCOUNTER — Ambulatory Visit: Payer: BC Managed Care – PPO | Admitting: Family Medicine

## 2018-03-09 ENCOUNTER — Encounter: Payer: Self-pay | Admitting: Family Medicine

## 2018-03-09 ENCOUNTER — Telehealth: Payer: Self-pay | Admitting: Family Medicine

## 2018-03-09 VITALS — BP 130/82 | HR 83 | Temp 98.3°F | Resp 16 | Wt 207.6 lb

## 2018-03-09 DIAGNOSIS — R112 Nausea with vomiting, unspecified: Secondary | ICD-10-CM | POA: Diagnosis not present

## 2018-03-09 DIAGNOSIS — M549 Dorsalgia, unspecified: Secondary | ICD-10-CM | POA: Diagnosis not present

## 2018-03-09 DIAGNOSIS — E119 Type 2 diabetes mellitus without complications: Secondary | ICD-10-CM

## 2018-03-09 DIAGNOSIS — I10 Essential (primary) hypertension: Secondary | ICD-10-CM

## 2018-03-09 MED ORDER — LISINOPRIL-HYDROCHLOROTHIAZIDE 20-25 MG PO TABS: 1.0000 | ORAL_TABLET | Freq: Every day | ORAL | 0 refills | Status: DC

## 2018-03-09 MED ORDER — ONDANSETRON 4 MG PO TBDP: 4.0000 mg | ORAL_TABLET | Freq: Three times a day (TID) | ORAL | 0 refills | Status: DC | PRN

## 2018-03-09 NOTE — Progress Notes (Signed)
Subjective:    Patient ID: Holly Hendrix, female    DOB: 04-02-1961, 57 y.o.   MRN: [id number]  HPI Chief Complaint  Patient presents with  . sick    throwing up food, green mucous, back pain, yesterday- not sure if its from throwing up, ache   She is here with complaints of a 1 day history of nausea, vomiting. States she had 4-5 episodes of vomiting yesterday and one this morning. States she also has a dull constant upper back pain that started yesterday along with the nausea and vomiting. Certain movements including laying down makes her pain worse. Rest improves pain. States pain has improved since yesterday.  States she ate chili and a burger from [facility] yesterday for lunch and   leftover soup that had been in her refrigerator for several days.  Denies abdominal pain, diarrhea or constipation. Reports having a normal bowel movement this morning. No blood in her stool.   Denies  fever, chills, dizziness, chest pain, palpitations, shortness of breath, cough, urinary symptoms. No numbness, tingling or weakness.   Requests refill of HTN medications.   Diabetes controlled with diet and exercise. Does not check blood sugar at home. needs recheck of Hgb A1c.   Reviewed allergies, medications, past medical, surgical, family, and social history.   Review of Systems Pertinent positives and negatives in the history of present illness.     Objective:   Physical Exam  Constitutional: She is oriented to person, place, and time. She appears well-developed and well-nourished. No distress.  HENT:  Mouth/Throat: Oropharynx is clear and moist.  Eyes: Pupils are equal, round, and reactive to light. Conjunctivae are normal.  Neck: Normal range of motion. Neck supple.  Cardiovascular: Normal rate, regular rhythm, normal heart sounds and intact distal pulses.  Pulmonary/Chest: Effort normal and breath sounds normal.  Abdominal: Soft. She exhibits no distension. Bowel sounds are decreased. There  is no hepatosplenomegaly. There is no tenderness. There is no rigidity, no rebound, no guarding, no CVA tenderness, no tenderness at McBurney's point and negative Murphy's sign.  Musculoskeletal:       Cervical back: Normal.       Thoracic back: She exhibits normal range of motion and no tenderness.  Mild TTP to thoracic bilateral paraspinal muscles.   Lymphadenopathy:    She has no cervical adenopathy.  Neurological: She is alert and oriented to person, place, and time. She has normal strength. No cranial nerve deficit or sensory deficit. Gait normal.  Skin: Skin is warm and dry. Capillary refill takes less than 2 seconds. No rash noted. She is not diaphoretic. No pallor.  Psychiatric: She has a normal mood and affect. Her speech is normal and behavior is normal. Thought content normal.   BP 130/82   Pulse 83   Temp 98.3 F (36.8 C) (Oral)   Resp 16   Wt 207 lb 9.6 oz (94.2 kg)   SpO2 98%   BMI 36.77 kg/m       Assessment & Plan:  Non-intractable vomiting with nausea, unspecified vomiting type - Plan: EKG 12-Lead, CBC with Differential/Platelet, Comprehensive metabolic panel, ondansetron (ZOFRAN ODT) 4 MG disintegrating tablet  Upper back pain - Plan: EKG 12-Lead, CBC with Differential/Platelet, Comprehensive metabolic panel  HYPERTENSION, BENIGN ESSENTIAL  Controlled type 2 diabetes mellitus without complication, without long-term current use of insulin (HCC) - Plan: CBC with Differential/Platelet, Comprehensive metabolic panel, Hemoglobin A1c  ECG unremarkable. No sign of cardiac disease. She is hemodynamically stable and in no acute  distress.  Unclear etiology but discussed whether this is a viral gastritis or related to food that the treatment is still supportive care.  Zofran prescribed. Advised to stay well hydrated and gradually increase her diet but avoid spicy, fried or acidic foods today and tomorrow.  Diabetes controlled with lifestyle management. Recheck Hgb A1c and  follow up.  HTN- needs refill of medication. BP is close to goal in setting of illness I am ok with this.  She will call tomorrow to let me know how she is doing or return if not improving.

## 2018-03-09 NOTE — Telephone Encounter (Signed)
Pt called and is requesting a refill on her lisinopril-hydrochliorothiazide, pt would like it sent to the [place], [place] - [address]

## 2018-03-09 NOTE — Telephone Encounter (Signed)
done

## 2018-03-09 NOTE — Patient Instructions (Addendum)
Take the Zofran for nausea and vomiting. This dissolves under your tongue.   Try to drink fluids such as ginger ale, sprite, water and eat a bland diet such as saltine crackers, chicken noodle soup, jello etc. Avoid fried foods, spicy foods, acidic foods.   Call tomorrow and let me know how you are feeling.    If you have any worsening symptoms such as fever, severe pain, uncontrolled vomiting then you should go to the emergency department.

## 2018-03-10 ENCOUNTER — Telehealth: Payer: Self-pay | Admitting: Family Medicine

## 2018-03-10 LAB — COMPREHENSIVE METABOLIC PANEL
ALBUMIN: 4.3 g/dL (ref 3.5–5.5)
ALK PHOS: 78 IU/L (ref 39–117)
ALT: 11 IU/L (ref 0–32)
AST: 14 IU/L (ref 0–40)
Albumin/Globulin Ratio: 1.3 (ref 1.2–2.2)
BUN / CREAT RATIO: 15 (ref 9–23)
BUN: 10 mg/dL (ref 6–24)
Bilirubin Total: 0.6 mg/dL (ref 0.0–1.2)
CALCIUM: 9.6 mg/dL (ref 8.7–10.2)
CO2: 26 mmol/L (ref 20–29)
CREATININE: 0.68 mg/dL (ref 0.57–1.00)
Chloride: 98 mmol/L (ref 96–106)
GFR, EST AFRICAN AMERICAN: 111 mL/min/{1.73_m2} (ref >59)
GFR, EST NON AFRICAN AMERICAN: 96 mL/min/{1.73_m2} (ref >59)
GLUCOSE: 111 mg/dL — AB (ref 65–99)
Globulin, Total: 3.2 g/dL (ref 1.5–4.5)
Potassium: 3.4 mmol/L — ABNORMAL LOW (ref 3.5–5.2)
Sodium: 142 mmol/L (ref 134–144)
TOTAL PROTEIN: 7.5 g/dL (ref 6.0–8.5)

## 2018-03-10 LAB — CBC WITH DIFFERENTIAL/PLATELET
BASOS: 1 %
Basophils Absolute: 0.1 10*3/uL (ref 0.0–0.2)
EOS (ABSOLUTE): 0.1 10*3/uL (ref 0.0–0.4)
EOS: 0 %
HEMATOCRIT: 45 % (ref 34.0–46.6)
HEMOGLOBIN: 15.2 g/dL (ref 11.1–15.9)
IMMATURE GRANULOCYTES: 0 %
Immature Grans (Abs): 0 10*3/uL (ref 0.0–0.1)
Lymphocytes Absolute: 3.3 10*3/uL — ABNORMAL HIGH (ref 0.7–3.1)
Lymphs: 26 %
MCH: 26.6 pg (ref 26.6–33.0)
MCHC: 33.8 g/dL (ref 31.5–35.7)
MCV: 79 fL (ref 79–97)
MONOS ABS: 0.7 10*3/uL (ref 0.1–0.9)
Monocytes: 6 %
NEUTROS ABS: 8.4 10*3/uL — AB (ref 1.4–7.0)
NEUTROS PCT: 67 %
Platelets: 268 10*3/uL (ref 150–450)
RBC: 5.72 x10E6/uL — AB (ref 3.77–5.28)
RDW: 14.7 % (ref 12.3–15.4)
WBC: 12.5 10*3/uL — ABNORMAL HIGH (ref 3.4–10.8)

## 2018-03-10 LAB — HEMOGLOBIN A1C
Est. average glucose Bld gHb Est-mCnc: 146 mg/dL
HEMOGLOBIN A1C: 6.7 % — AB (ref 4.8–5.6)

## 2018-03-10 NOTE — Telephone Encounter (Signed)
Pt called and stated that she was feeling better. She was to let you know. Pt can be reached at [phone].

## 2018-03-16 ENCOUNTER — Ambulatory Visit: Payer: BC Managed Care – PPO | Admitting: Family Medicine

## 2018-03-16 ENCOUNTER — Encounter: Payer: Self-pay | Admitting: Family Medicine

## 2018-03-16 VITALS — BP 120/80 | HR 71 | Temp 98.7°F | Resp 16 | Wt 209.2 lb

## 2018-03-16 DIAGNOSIS — G8929 Other chronic pain: Secondary | ICD-10-CM

## 2018-03-16 DIAGNOSIS — R112 Nausea with vomiting, unspecified: Secondary | ICD-10-CM

## 2018-03-16 DIAGNOSIS — Z23 Encounter for immunization: Secondary | ICD-10-CM

## 2018-03-16 DIAGNOSIS — Z09 Encounter for follow-up examination after completed treatment for conditions other than malignant neoplasm: Secondary | ICD-10-CM | POA: Diagnosis not present

## 2018-03-16 DIAGNOSIS — M79672 Pain in left foot: Secondary | ICD-10-CM | POA: Diagnosis not present

## 2018-03-16 HISTORY — DX: Other chronic pain: G89.29

## 2018-03-16 NOTE — Patient Instructions (Signed)
Go to a shoe store and let them evaluate your feet to make sure you are wearing appropriate fitting shoes.   [employer] on [address] or [address] on [address].

## 2018-03-16 NOTE — Progress Notes (Signed)
   Subjective:    Patient ID: Holly Hendrix, female    DOB: 1960/08/15, 57 y.o.   MRN: [id number]  HPI Chief Complaint  Patient presents with  . follow-up    follow-up on sick,. feeling better   She is here today to follow-up on nausea and vomiting as well as upper back pain. No longer having nausea, vomiting or pain. States she is back to her baseline.   She also had a slightly elevated WBC count and slightly low potassium related to her sickness.   Her Hgb A1c was 6.7% and diabetes has been controlled with diet and exercise previously. She has declined starting on diabetes medication.   Complains of left foot pain for over a year and has tried new shoes without relief. Denies pain today. Plans to get inserts.   Denies fever, chills, dizziness, chest pain, palpitations, shortness of breath, abdominal pain, back pain, N/V/D, urinary symptoms.   Reviewed allergies, medications, past medical, surgical, family, and social history.   Review of Systems Pertinent positives and negatives in the history of present illness.     Objective:   Physical Exam BP 120/80   Pulse 71   Temp 98.7 F (37.1 C) (Oral)   Resp 16   Wt 209 lb 3.2 oz (94.9 kg)   SpO2 98%   BMI 37.06 kg/m   Right foot normal exam. Left foot with normal sensation, pulses, motion and strength. Non tender. No erythema, edema.       Assessment & Plan:  Follow up  Non-intractable vomiting with nausea, unspecified vomiting type  Chronic foot pain, left  Needs flu shot - Plan: Flu Vaccine QUAD 36+ mos IM  Symptoms have resolved and she reports being back to her usual state of health.  Foot pain with normal exam. She will try to get an insert and will let me know if she would like a referral to podiatry.  Declines medication for diabetes. Hgb A1c 6.7% and still controlled.  She is on an ace inhibitor and statin without side effects.  Declines recheck of labs and I am ok with this since symptoms resolved.

## 2018-06-04 ENCOUNTER — Other Ambulatory Visit: Payer: Self-pay | Admitting: Family Medicine

## 2018-06-17 ENCOUNTER — Other Ambulatory Visit: Payer: Self-pay | Admitting: Family Medicine

## 2018-06-17 DIAGNOSIS — E782 Mixed hyperlipidemia: Secondary | ICD-10-CM

## 2018-07-06 ENCOUNTER — Ambulatory Visit: Payer: BC Managed Care – PPO | Admitting: Family Medicine

## 2018-07-06 ENCOUNTER — Encounter: Payer: Self-pay | Admitting: Family Medicine

## 2018-07-06 VITALS — BP 120/80 | HR 94 | Wt 206.4 lb

## 2018-07-06 DIAGNOSIS — E049 Nontoxic goiter, unspecified: Secondary | ICD-10-CM

## 2018-07-06 DIAGNOSIS — Z23 Encounter for immunization: Secondary | ICD-10-CM | POA: Diagnosis not present

## 2018-07-06 DIAGNOSIS — E119 Type 2 diabetes mellitus without complications: Secondary | ICD-10-CM

## 2018-07-06 DIAGNOSIS — E785 Hyperlipidemia, unspecified: Secondary | ICD-10-CM

## 2018-07-06 DIAGNOSIS — Z79899 Other long term (current) drug therapy: Secondary | ICD-10-CM

## 2018-07-06 DIAGNOSIS — E6609 Other obesity due to excess calories: Secondary | ICD-10-CM | POA: Diagnosis not present

## 2018-07-06 DIAGNOSIS — I10 Essential (primary) hypertension: Secondary | ICD-10-CM | POA: Diagnosis not present

## 2018-07-06 DIAGNOSIS — Z6836 Body mass index (BMI) 36.0-36.9, adult: Secondary | ICD-10-CM

## 2018-07-06 LAB — POCT GLYCOSYLATED HEMOGLOBIN (HGB A1C): Hemoglobin A1C: 6.6 % — AB (ref 4.0–5.6)

## 2018-07-06 NOTE — Patient Instructions (Addendum)
Your hemoglobin A1c is 6.6% today.  This is still in goal range.  Continue eating healthy diet by limiting your sweets and carbohydrates. I would like for you to try and get at least 150 minutes of physical activity per week outside of your typical daily activity.  Call and schedule your diabetic eye exam.   Check when your last Tdap (tetanus) injection was and let me know.   Let me know if you change your mind about the thyroid ultrasound and I will order this to be done at [facility].   Your blood pressure is good today at 120/80.  Continue eating a low-salt diet  We will call you with your lab results and will refill your cholesterol medication once I have these.  Return for your physical exam in August and you do not have to fast for this.   DASH Eating Plan DASH stands for "Dietary Approaches to Stop Hypertension." The DASH eating plan is a healthy eating plan that has been shown to reduce high blood pressure (hypertension). It may also reduce your risk for type 2 diabetes, heart disease, and stroke. The DASH eating plan may also help with weight loss. What are tips for following this plan?  General guidelines  Avoid eating more than 2,300 mg (milligrams) of salt (sodium) a day. If you have hypertension, you may need to reduce your sodium intake to 1,500 mg a day.  Limit alcohol intake to no more than 1 drink a day for nonpregnant women and 2 drinks a day for men. One drink equals 12 oz of beer, 5 oz of wine, or 1 oz of hard liquor.  Work with your health care provider to maintain a healthy body weight or to lose weight. Ask what an ideal weight is for you.  Get at least 30 minutes of exercise that causes your heart to beat faster (aerobic exercise) most days of the week. Activities may include walking, swimming, or biking.  Work with your health care provider or diet and nutrition specialist (dietitian) to adjust your eating plan to your individual calorie needs. Reading  food labels   Check food labels for the amount of sodium per serving. Choose foods with less than 5 percent of the Daily Value of sodium. Generally, foods with less than 300 mg of sodium per serving fit into this eating plan.  To find whole grains, look for the word "whole" as the first word in the ingredient list. Shopping  Buy products labeled as "low-sodium" or "no salt added."  Buy fresh foods. Avoid canned foods and premade or frozen meals. Cooking  Avoid adding salt when cooking. Use salt-free seasonings or herbs instead of table salt or sea salt. Check with your health care provider or pharmacist before using salt substitutes.  Do not fry foods. Cook foods using healthy methods such as baking, boiling, grilling, and broiling instead.  Cook with heart-healthy oils, such as olive, canola, soybean, or sunflower oil. Meal planning  Eat a balanced diet that includes: ? 5 or more servings of fruits and vegetables each day. At each meal, try to fill half of your plate with fruits and vegetables. ? Up to 6-8 servings of whole grains each day. ? Less than 6 oz of lean meat, poultry, or fish each day. A 3-oz serving of meat is about the same size as a deck of cards. One egg equals 1 oz. ? 2 servings of low-fat dairy each day. ? A serving of nuts, seeds, or beans  5 times each week. ? Heart-healthy fats. Healthy fats called Omega-3 fatty acids are found in foods such as flaxseeds and coldwater fish, like sardines, salmon, and mackerel.  Limit how much you eat of the following: ? Canned or prepackaged foods. ? Food that is high in trans fat, such as fried foods. ? Food that is high in saturated fat, such as fatty meat. ? Sweets, desserts, sugary drinks, and other foods with added sugar. ? Full-fat dairy products.  Do not salt foods before eating.  Try to eat at least 2 vegetarian meals each week.  Eat more home-cooked food and less restaurant, buffet, and fast food.  When eating at  a restaurant, ask that your food be prepared with less salt or no salt, if possible. What foods are recommended? The items listed may not be a complete list. Talk with your dietitian about what dietary choices are best for you. Grains Whole-grain or whole-wheat bread. Whole-grain or whole-wheat pasta. Brown rice. [place]. Bulgur. Whole-grain and low-sodium cereals. Pita bread. Low-fat, low-sodium crackers. Whole-wheat flour tortillas. Vegetables Fresh or frozen vegetables (raw, steamed, roasted, or grilled). Low-sodium or reduced-sodium tomato and vegetable juice. Low-sodium or reduced-sodium tomato sauce and tomato paste. Low-sodium or reduced-sodium canned vegetables. Fruits All fresh, dried, or frozen fruit. Canned fruit in natural juice (without added sugar). Meat and other protein foods Skinless chicken or [place]. Ground chicken or [place]. Pork with fat trimmed off. Fish and seafood. Egg whites. Dried beans, peas, or lentils. Unsalted nuts, nut butters, and seeds. Unsalted canned beans. Lean cuts of beef with fat trimmed off. Low-sodium, lean deli meat. Dairy Low-fat (1%) or fat-free (skim) milk. Fat-free, low-fat, or reduced-fat cheeses. Nonfat, low-sodium ricotta or cottage cheese. Low-fat or nonfat yogurt. Low-fat, low-sodium cheese. Fats and oils Soft margarine without trans fats. Vegetable oil. Low-fat, reduced-fat, or light mayonnaise and salad dressings (reduced-sodium). Canola, safflower, olive, soybean, and sunflower oils. Avocado. Seasoning and other foods Herbs. Spices. Seasoning mixes without salt. Unsalted popcorn and pretzels. Fat-free sweets. What foods are not recommended? The items listed may not be a complete list. Talk with your dietitian about what dietary choices are best for you. Grains Baked goods made with fat, such as croissants, muffins, or some breads. Dry pasta or rice meal packs. Vegetables Creamed or fried vegetables. Vegetables in a cheese sauce.  Regular canned vegetables (not low-sodium or reduced-sodium). Regular canned tomato sauce and paste (not low-sodium or reduced-sodium). Regular tomato and vegetable juice (not low-sodium or reduced-sodium). Angie Fava. Olives. Fruits Canned fruit in a light or heavy syrup. Fried fruit. Fruit in cream or butter sauce. Meat and other protein foods Fatty cuts of meat. Ribs. Fried meat. Berniece Salines. Sausage. Bologna and other processed lunch meats. Salami. Fatback. Hotdogs. Bratwurst. Salted nuts and seeds. Canned beans with added salt. Canned or smoked fish. Whole eggs or egg yolks. Chicken or [place] with skin. Dairy Whole or 2% milk, cream, and half-and-half. Whole or full-fat cream cheese. Whole-fat or sweetened yogurt. Full-fat cheese. Nondairy creamers. Whipped toppings. Processed cheese and cheese spreads. Fats and oils Butter. Stick margarine. Lard. Shortening. Ghee. Bacon fat. Tropical oils, such as coconut, palm kernel, or palm oil. Seasoning and other foods Salted popcorn and pretzels. Onion salt, garlic salt, seasoned salt, table salt, and sea salt. Worcestershire sauce. Tartar sauce. Barbecue sauce. Teriyaki sauce. Soy sauce, including reduced-sodium. Steak sauce. Canned and packaged gravies. Fish sauce. Oyster sauce. Cocktail sauce. Horseradish that you find on the shelf. Ketchup. Mustard. Meat flavorings and tenderizers. Bouillon cubes.  Hot sauce and Tabasco sauce. Premade or packaged marinades. Premade or packaged taco seasonings. Relishes. Regular salad dressings. Where to find more information:  National Heart, Lung, and [place]: https://wilson-eaton.com/  American Heart Association: www.heart.org Summary  The DASH eating plan is a healthy eating plan that has been shown to reduce high blood pressure (hypertension). It may also reduce your risk for type 2 diabetes, heart disease, and stroke.  With the DASH eating plan, you should limit salt (sodium) intake to 2,300 mg a day. If you have  hypertension, you may need to reduce your sodium intake to 1,500 mg a day.  When on the DASH eating plan, aim to eat more fresh fruits and vegetables, whole grains, lean proteins, low-fat dairy, and heart-healthy fats.  Work with your health care provider or diet and nutrition specialist (dietitian) to adjust your eating plan to your individual calorie needs. This information is not intended to replace advice given to you by your health care provider. Make sure you discuss any questions you have with your health care provider. Document Released: 04/25/2011 Document Revised: 04/29/2016 Document Reviewed: 04/29/2016 Elsevier Interactive Patient Education  2019 [employer].

## 2018-07-06 NOTE — Progress Notes (Signed)
Subjective:    Patient ID: Holly Hendrix, female    DOB: [birth year], 58 y.o.   MRN: [id number]  Holly Hendrix is a 58 y.o. female who presents for follow-up of Type 2 diabetes mellitus, HTN and hyperlipidemia. No new concerns today.   HTN- does not check BP at home. Reports taking daily medication without concerns.  Dyslipidemia- taking statin daily. No side effects reported. No skin, hair or nail changes. Bowel movements regular. Mood normal.   Thyroid was enlarged at her previous visit with normal TSH and free T4. Declined [place]. No difficulty swallowing and no fullness reported in neck.   Patient are not checking home blood sugars.   Home blood sugar records: patient does not check sugars How often is blood sugars being checked: none Current symptoms include: none. Patient denies nausea, paresthesia of the feet, polydipsia, polyuria, visual disturbances, vomiting, weight loss and chest pain, palpitations, shortness of breath. .  Patient is checking their feet daily. Any Foot concerns (callous, ulcer, wound, thickened nails, toenail fungus, skin fungus, hammer toe): none Last dilated eye exam: overdue   Current treatments: none. Medication compliance: good  Current diet: in general, an "unhealthy" diet Current exercise: none Known diabetic complications: none  The following portions of the patient's history were reviewed and updated as appropriate: allergies, current medications, past medical history, past social history and problem list.  ROS as in subjective above.     Objective:    Physical Exam Alert and oriented and in no acute distress. Thyroid gland enlarged, non tender, no palpable nodules.  Foot exam normal.   Blood pressure 120/80, pulse 94, weight 206 lb 6.4 oz (93.6 kg).  Lab Review Diabetic Labs Latest Ref Rng & Units 07/06/2018 03/09/2018 08/27/2017 04/14/2017 01/09/2017  HbA1c 4.0 - 5.6 % 6.6(A) 6.7(H) 6.4 6.2 6.7%  Microalbumin Not estab mg/dL - - - - 0.4    Micro/Creat Ratio <30 mcg/mg creat - - - - 4  Chol 100 - 199 mg/dL - - 124 - -  HDL >39 mg/dL - - 49 - -  Calc LDL 0 - 99 mg/dL - - 62 - -  Triglycerides 0 - 149 mg/dL - - 64 - -  Creatinine 0.57 - 1.00 mg/dL - 0.68 0.66 - 0.77   BP/Weight 07/06/2018 03/16/2018 03/09/2018 08/27/2017 7/61/6073  Systolic BP 710 626 948 546 270  Diastolic BP 80 80 82 86 82  Wt. (Lbs) 206.4 209.2 207.6 204.6 199  BMI 36.56 37.06 36.77 36.24 35.25   Foot/eye exam completion dates 07/06/2018 01/09/2017  Foot Form Completion Done Done    Holly Hendrix  reports that she has never smoked. She has never used smokeless tobacco. She reports that she does not drink alcohol or use drugs.     Assessment & Plan:    Controlled type 2 diabetes mellitus without complication, without long-term current use of insulin ([place]) - Plan: HgB A1c, CBC with Differential/Platelet, Comprehensive metabolic panel, Microalbumin / creatinine urine ratio, Pneumococcal conjugate vaccine 13-valent, TSH, T4, free  HYPERTENSION, BENIGN ESSENTIAL - Plan: CBC with Differential/Platelet, Comprehensive metabolic panel  Dyslipidemia - Plan: Lipid panel  Class 2 obesity due to excess calories without serious comorbidity with body mass index (BMI) of 36.0 to 36.9 in adult - Plan: TSH, T4, free  Enlarged thyroid gland - Plan: TSH, T4, free  Need for vaccination against Streptococcus pneumoniae - Plan: Pneumococcal conjugate vaccine 13-valent  Medication management - Plan: Lipid panel  1. Rx changes: none Hgb A1c 6.6% she has been  controlling diabetes with diet and exercise and declines starting medication again today.  2. Education: Reviewed 'ABCs' of diabetes management (respective goals in parentheses):  A1C (<7), blood pressure (<130/80), and cholesterol (LDL <100). 3. HTN- BP in goal range. Continue on current medication. No side effects reported.  4. Dyslipidemia- good compliance with statin and needs refill. Check fasting lipids today.   5. Compliance at present is estimated to be good. Efforts to improve compliance (if necessary) will be directed at dietary modifications: reduce sweets and carbohydrates and increased exercise. 6. Diabetes foot exam done and normal.  7. Urine microalbumin done 8. Prevnar 13 given.  9. She will let me know when her last Tdap was so that I can update her chart.  10. Discussed that her thyroid is enlarged. No palpable masses and previous thyroid labs normal. Recheck thyroid function. Recommend getting a thyroid US and she would like to hold off for now.  11. Follow up: 6 months for non fasting CPE. Consider Tdap and Shingrix if not UTD. Discuss thyroid US at that time as well.

## 2018-07-07 LAB — COMPREHENSIVE METABOLIC PANEL
A/G RATIO: 1.4 (ref 1.2–2.2)
ALT: 17 IU/L (ref 0–32)
AST: 16 IU/L (ref 0–40)
Albumin: 4.1 g/dL (ref 3.8–4.9)
Alkaline Phosphatase: 71 IU/L (ref 39–117)
BUN/Creatinine Ratio: 21 (ref 9–23)
BUN: 15 mg/dL (ref 6–24)
Bilirubin Total: 0.4 mg/dL (ref 0.0–1.2)
CHLORIDE: 100 mmol/L (ref 96–106)
CO2: 26 mmol/L (ref 20–29)
Calcium: 9.6 mg/dL (ref 8.7–10.2)
Creatinine, Ser: 0.73 mg/dL (ref 0.57–1.00)
GFR calc Af Amer: 104 mL/min/{1.73_m2} (ref >59)
GFR calc non Af Amer: 90 mL/min/{1.73_m2} (ref >59)
Globulin, Total: 3 g/dL (ref 1.5–4.5)
Glucose: 97 mg/dL (ref 65–99)
POTASSIUM: 3.8 mmol/L (ref 3.5–5.2)
Sodium: 141 mmol/L (ref 134–144)
Total Protein: 7.1 g/dL (ref 6.0–8.5)

## 2018-07-07 LAB — CBC WITH DIFFERENTIAL/PLATELET
Basophils Absolute: 0.1 10*3/uL (ref 0.0–0.2)
Basos: 1 %
EOS (ABSOLUTE): 0.4 10*3/uL (ref 0.0–0.4)
EOS: 4 %
HEMOGLOBIN: 14.8 g/dL (ref 11.1–15.9)
Hematocrit: 45.2 % (ref 34.0–46.6)
IMMATURE GRANS (ABS): 0 10*3/uL (ref 0.0–0.1)
Immature Granulocytes: 0 %
LYMPHS: 47 %
Lymphocytes Absolute: 3.9 10*3/uL — ABNORMAL HIGH (ref 0.7–3.1)
MCH: 25.8 pg — AB (ref 26.6–33.0)
MCHC: 32.7 g/dL (ref 31.5–35.7)
MCV: 79 fL (ref 79–97)
MONOCYTES: 7 %
Monocytes Absolute: 0.6 10*3/uL (ref 0.1–0.9)
NEUTROS ABS: 3.4 10*3/uL (ref 1.4–7.0)
Neutrophils: 41 %
Platelets: 249 10*3/uL (ref 150–450)
RBC: 5.74 x10E6/uL — ABNORMAL HIGH (ref 3.77–5.28)
RDW: 14.8 % (ref 11.7–15.4)
WBC: 8.3 10*3/uL (ref 3.4–10.8)

## 2018-07-07 LAB — LIPID PANEL
CHOL/HDL RATIO: 3.3 ratio (ref 0.0–4.4)
Cholesterol, Total: 187 mg/dL (ref 100–199)
HDL: 56 mg/dL (ref >39)
LDL Calculated: 116 mg/dL — ABNORMAL HIGH (ref 0–99)
Triglycerides: 74 mg/dL (ref 0–149)
VLDL Cholesterol Cal: 15 mg/dL (ref 5–40)

## 2018-07-07 LAB — T4, FREE: Free T4: 1.28 ng/dL (ref 0.82–1.77)

## 2018-07-07 LAB — MICROALBUMIN / CREATININE URINE RATIO
Creatinine, Urine: 147.7 mg/dL
Microalb/Creat Ratio: 8 mg/g creat (ref 0–29)
Microalbumin, Urine: 12 ug/mL

## 2018-07-07 LAB — TSH: TSH: 1.24 u[IU]/mL (ref 0.450–4.500)

## 2018-07-08 ENCOUNTER — Other Ambulatory Visit: Payer: Self-pay | Admitting: Internal Medicine

## 2018-07-08 DIAGNOSIS — E782 Mixed hyperlipidemia: Secondary | ICD-10-CM

## 2018-07-08 MED ORDER — ATORVASTATIN CALCIUM 20 MG PO TABS: 20.0000 mg | ORAL_TABLET | Freq: Every day | ORAL | 1 refills | Status: DC

## 2018-07-17 ENCOUNTER — Ambulatory Visit: Payer: BC Managed Care – PPO | Admitting: Family Medicine

## 2018-08-31 ENCOUNTER — Other Ambulatory Visit: Payer: Self-pay | Admitting: Family Medicine

## 2018-09-28 ENCOUNTER — Other Ambulatory Visit: Payer: Self-pay | Admitting: Family Medicine

## 2018-09-28 DIAGNOSIS — Z1231 Encounter for screening mammogram for malignant neoplasm of breast: Secondary | ICD-10-CM

## 2018-11-18 ENCOUNTER — Ambulatory Visit
Admission: RE | Admit: 2018-11-18 | Discharge: 2018-11-18 | Disposition: A | Discharge status: Home / Self Care | Payer: BC Managed Care – PPO | Source: Ambulatory Visit | Attending: Family Medicine | Admitting: Family Medicine

## 2018-11-18 ENCOUNTER — Other Ambulatory Visit: Payer: Self-pay

## 2018-11-18 DIAGNOSIS — Z1231 Encounter for screening mammogram for malignant neoplasm of breast: Secondary | ICD-10-CM

## 2018-12-02 ENCOUNTER — Other Ambulatory Visit: Payer: Self-pay | Admitting: Family Medicine

## 2019-01-04 NOTE — Patient Instructions (Addendum)
Your hemoglobin A1c is 7.7% and your diabetes is now not well controlled.  Recommend you cut back significantly on sugar and carbohydrates (potatoes, bread, pasta, rice) Increase your physical activity.  I am starting you on a once daily medication for your diabetes.  Take this in the morning with breakfast. If you change your mind and would like to start checking your blood sugars, let us know.  Call and schedule your diabetic eye exam!!!   Check and see when you last had your tetanus vaccine as discussed and call with the date.  Your blood pressure today is 140/90 and is not in goal range. I recommend that you check your blood pressure outside of here.  Goal blood pressures less than 130/80. Continue on your current medications but if you are not seen your blood pressure within goal range consistently, let me know.  Cut back on sodium to help improve your blood pressure and be more physically active  I will see you back in 3 months to recheck your diabetes.    Preventive Care 70-59 Years Old, Female Preventive care refers to visits with your health care provider and lifestyle choices that can promote health and wellness. This includes:  A yearly physical exam. This may also be called an annual well check.  Regular dental visits and eye exams.  Immunizations.  Screening for certain conditions.  Healthy lifestyle choices, such as eating a healthy diet, getting regular exercise, not using drugs or products that contain nicotine and tobacco, and limiting alcohol use. What can I expect for my preventive care visit? Physical exam Your health care provider will check your:  Height and weight. This may be used to calculate body mass index (BMI), which tells if you are at a healthy weight.  Heart rate and blood pressure.  Skin for abnormal spots. Counseling Your health care provider may ask you questions about your:  Alcohol, tobacco, and drug use.  Emotional well-being.   Home and relationship well-being.  Sexual activity.  Eating habits.  Work and work Statistician.  Method of birth control.  Menstrual cycle.  Pregnancy history. What immunizations do I need?  Influenza (flu) vaccine  This is recommended every year. Tetanus, diphtheria, and pertussis (Tdap) vaccine  You may need a Td booster every 10 years. Varicella (chickenpox) vaccine  You may need this if you have not been vaccinated. Zoster (shingles) vaccine  You may need this after age 24. Measles, mumps, and rubella (MMR) vaccine  You may need at least one dose of MMR if you were born in 1957 or later. You may also need a second dose. Pneumococcal conjugate (PCV13) vaccine  You may need this if you have certain conditions and were not previously vaccinated. Pneumococcal polysaccharide (PPSV23) vaccine  You may need one or two doses if you smoke cigarettes or if you have certain conditions. Meningococcal conjugate (MenACWY) vaccine  You may need this if you have certain conditions. Hepatitis A vaccine  You may need this if you have certain conditions or if you travel or work in places where you may be exposed to hepatitis A. Hepatitis B vaccine  You may need this if you have certain conditions or if you travel or work in places where you may be exposed to hepatitis B. Haemophilus influenzae type b (Hib) vaccine  You may need this if you have certain conditions. Human papillomavirus (HPV) vaccine  If recommended by your health care provider, you may need three doses over 6 months. You may  receive vaccines as individual doses or as more than one vaccine together in one shot (combination vaccines). Talk with your health care provider about the risks and benefits of combination vaccines. What tests do I need? Blood tests  Lipid and cholesterol levels. These may be checked every 5 years, or more frequently if you are over 54 years old.  Hepatitis C test.  Hepatitis B test.  Screening  Lung cancer screening. You may have this screening every year starting at age 31 if you have a 30-pack-year history of smoking and currently smoke or have quit within the past 15 years.  Colorectal cancer screening. All adults should have this screening starting at age 21 and continuing until age 80. Your health care provider may recommend screening at age 42 if you are at increased risk. You will have tests every 1-10 years, depending on your results and the type of screening test.  Diabetes screening. This is done by checking your blood sugar (glucose) after you have not eaten for a while (fasting). You may have this done every 1-3 years.  Mammogram. This may be done every 1-2 years. Talk with your health care provider about when you should start having regular mammograms. This may depend on whether you have a family history of breast cancer.  BRCA-related cancer screening. This may be done if you have a family history of breast, ovarian, tubal, or peritoneal cancers.  Pelvic exam and Pap test. This may be done every 3 years starting at age 39. Starting at age 10, this may be done every 5 years if you have a Pap test in combination with an HPV test. Other tests  Sexually transmitted disease (STD) testing.  Bone density scan. This is done to screen for osteoporosis. You may have this scan if you are at high risk for osteoporosis. Follow these instructions at home: Eating and drinking  Eat a diet that includes fresh fruits and vegetables, whole grains, lean protein, and low-fat dairy.  Take vitamin and mineral supplements as recommended by your health care provider.  Do not drink alcohol if: ? Your health care provider tells you not to drink. ? You are pregnant, may be pregnant, or are planning to become pregnant.  If you drink alcohol: ? Limit how much you have to 0-1 drink a day. ? Be aware of how much alcohol is in your drink. In the [place] one drink equals one 12 oz  bottle of beer (355 mL), one 5 oz glass of wine (148 mL), or one 1 oz glass of hard liquor (44 mL). Lifestyle  Take daily care of your teeth and gums.  Stay active. Exercise for at least 30 minutes on 5 or more days each week.  Do not use any products that contain nicotine or tobacco, such as cigarettes, e-cigarettes, and chewing tobacco. If you need help quitting, ask your health care provider.  If you are sexually active, practice safe sex. Use a condom or other form of birth control (contraception) in order to prevent pregnancy and STIs (sexually transmitted infections).  If told by your health care provider, take low-dose aspirin daily starting at age 43. What's next?  Visit your health care provider once a year for a well check visit.  Ask your health care provider how often you should have your eyes and teeth checked.  Stay up to date on all vaccines. This information is not intended to replace advice given to you by your health care provider. Make sure you discuss  any questions you have with your health care provider. Document Released: 06/02/2015 Document Revised: 01/15/2018 Document Reviewed: 01/15/2018 Elsevier Patient Education  2020 [employer].

## 2019-01-04 NOTE — Progress Notes (Signed)
Subjective:    Patient ID: Holly Hendrix, female    DOB: 07/03/1960, 58 y.o.   MRN: [id number]  HPI Chief Complaint  Patient presents with  . cpe    fasting cpe (only had a peice of candy), no other concerns   She is here for a complete physical exam and follow up on chronic health conditions   Other providers: None   Diabetes- has been well controlled in the past but recently her diet consists of more soda, sugar and carbohydrates and her activity level has decreased due to Covid-19. Has not been on medication in the past. Does not check her BS and does not want to do this.  She has not gotten a diabetic eye exam. Agrees to schedule this.   HTN- reports taking lisinopril- HCTZ daily. Does not check her BP at home.   HL- taking statin daily with no side effects.   New complaint- reports she had what seemed to be a period last month. Bled lightly for 3-4 days. She also had lower abdominal pain that felt like cramping she had in the past with her periods.   Social history: Lives alone, works at [employer] in housekeeping.  Denies smoking, drinking alcohol, drug use  Diet: states her diet has been poor. Eating more sweets and carbohydrates. Drinks soda and sweet tea.  Excerise: nothing   Immunizations: Tdap in 2016 per patient for new job, no record of this   Pneumonia vaccine - never   Health maintenance:  Mammogram: 11/18/2018 Colonoscopy: 10/2016 Last Gynecological Exam: 08/2016 Last Menstrual cycle: years ago  Last Dental Exam: July 2020  Last Eye Exam: years ago.   Wears seatbelt always, smoke detectors in home and functioning, does not text while driving and feels safe in home environment.   Reviewed allergies, medications, past medical, surgical, family, and social history.   Review of Systems Review of Systems Constitutional: -fever, -chills, -sweats, -unexpected weight change,-fatigue ENT: -runny nose, -ear pain, -sore throat Cardiology:  -chest pain,  -palpitations, -edema Respiratory: -cough, -shortness of breath, -wheezing Gastroenterology: -abdominal pain, -nausea, -vomiting, -diarrhea, -constipation  Hematology: -bleeding or bruising problems Musculoskeletal: -arthralgias, -myalgias, -joint swelling, -back pain Ophthalmology: -vision changes Urology: -dysuria, -difficulty urinating, -hematuria, -urinary frequency, -urgency Neurology: -headache, -weakness, -tingling, -numbness       Objective:   Physical Exam BP 140/90   Pulse (!) 106   Temp 97.7 F (36.5 C)   Ht 5' 3.5" (1.613 m)   Wt 228 lb 12.8 oz (103.8 kg)   BMI 39.89 kg/m   General Appearance:    Alert, cooperative, no distress, appears stated age  Head:    Normocephalic, without obvious abnormality, atraumatic  Eyes:    PERRL, conjunctiva/corneas clear, EOM's intact, fundi    benign  Ears:    Normal TM's and external ear canals  Nose:   Mask in place   Throat:   Mask in place   Neck:   Supple, no lymphadenopathy;  thyroid:  Enlarged with questionable right nodule, no tenderness; no carotid   bruit or JVD  Back:    Spine nontender, no curvature, ROM normal, no CVA     tenderness  Lungs:     Clear to auscultation bilaterally without wheezes, rales or     ronchi; respirations unlabored  Chest Wall:    No tenderness or deformity   Heart:    Regular rate and rhythm, S1 and S2 normal, no murmur, rub   or gallop  Breast Exam:  Declines, mammogram   Abdomen:     Soft, non-tender, nondistended, normoactive bowel sounds,    no masses, no hepatosplenomegaly  Genitalia:    Normal external genitalia without lesions.  BUS and vagina normal; cervix without lesions, or cervical motion tenderness. No abnormal vaginal discharge.  Uterus and adnexa not enlarged, nontender, no masses.  Pap performed. Chaperone present.      Extremities:   No clubbing, cyanosis or edema  Pulses:   2+ and symmetric all extremities  Skin:   Skin color, texture, turgor normal, no rashes or lesions   Lymph nodes:   Cervical, supraclavicular, and axillary nodes normal  Neurologic:   CNII-XII intact, normal strength, sensation and gait; reflexes 2+ and symmetric throughout          Psych:   Normal mood, affect, hygiene and grooming.         Assessment & Plan:  Routine general medical examination at a health care facility - Plan: CBC with Differential/Platelet, Comprehensive metabolic panel, TSH, T4, free, Lipid panel. Here today for fasting CPE. Discussed preventive health care including immunizations and safety. She is doing well overall.   HYPERTENSION, BENIGN ESSENTIAL - Plan: discussed that her BP is elevated today. Encouraged her to check her BP at home and follow up if readings are not in goal range. Cut back on sodium. Increase physical activity.   Controlled type 2 diabetes mellitus without complication, without long-term current use of insulin ([place]) - Plan: metFORMIN (GLUCOPHAGE) 500 MG tablet, HgB A1c, Hgb A1c elevated now at 7.7%. counseling on diet and exercise and will start her on once daily Metformin. Education provided on the diabetes spectrum and encouraged her to check her BS at home. She declines to check it. Offered referral to nutritionist and she declines.  Encouraged her to get her diabetic eye exam.   Enlarged thyroid - Plan: check thyroid function. Consider [place].   Dyslipidemia - Plan: continue statin and follow up pending labs.   Post-menopausal bleeding - Plan: US Pelvic Complete With Transvaginal, updated pap smear. Follow up pending [place].   Screening for cervical cancer - Plan: Cytology - PAP([place]), chaperone present, tolerated well. Follow up pending result  Need for vaccination against Streptococcus pneumoniae - Plan: Pneumococcal conjugate vaccine 13-valent, counseling on vaccine and possible side effects.   Needs flu shot - Plan: flu shot given.   Bilateral lower abdominal cramping - Plan: US Pelvic Complete With Transvaginal, follow up pending [place]

## 2019-01-05 ENCOUNTER — Other Ambulatory Visit: Payer: Self-pay

## 2019-01-05 ENCOUNTER — Other Ambulatory Visit (HOSPITAL_COMMUNITY)
Admission: RE | Admit: 2019-01-05 | Discharge: 2019-01-05 | Disposition: A | Discharge status: Home / Self Care | Payer: BC Managed Care – PPO | Source: Ambulatory Visit | Attending: Family Medicine | Admitting: Family Medicine

## 2019-01-05 ENCOUNTER — Encounter: Payer: Self-pay | Admitting: Family Medicine

## 2019-01-05 ENCOUNTER — Ambulatory Visit: Payer: BC Managed Care – PPO | Admitting: Family Medicine

## 2019-01-05 VITALS — BP 140/90 | HR 106 | Temp 97.7°F | Ht 63.5 in | Wt 228.8 lb

## 2019-01-05 DIAGNOSIS — E785 Hyperlipidemia, unspecified: Secondary | ICD-10-CM | POA: Diagnosis not present

## 2019-01-05 DIAGNOSIS — E049 Nontoxic goiter, unspecified: Secondary | ICD-10-CM

## 2019-01-05 DIAGNOSIS — E119 Type 2 diabetes mellitus without complications: Secondary | ICD-10-CM | POA: Diagnosis not present

## 2019-01-05 DIAGNOSIS — N950 Postmenopausal bleeding: Secondary | ICD-10-CM

## 2019-01-05 DIAGNOSIS — Z23 Encounter for immunization: Secondary | ICD-10-CM

## 2019-01-05 DIAGNOSIS — R1032 Left lower quadrant pain: Secondary | ICD-10-CM

## 2019-01-05 DIAGNOSIS — I10 Essential (primary) hypertension: Secondary | ICD-10-CM

## 2019-01-05 DIAGNOSIS — Z0000 Encounter for general adult medical examination without abnormal findings: Secondary | ICD-10-CM

## 2019-01-05 DIAGNOSIS — Z124 Encounter for screening for malignant neoplasm of cervix: Secondary | ICD-10-CM | POA: Insufficient documentation

## 2019-01-05 DIAGNOSIS — R1031 Right lower quadrant pain: Secondary | ICD-10-CM

## 2019-01-05 LAB — POCT GLYCOSYLATED HEMOGLOBIN (HGB A1C): Hemoglobin A1C: 7.7 % — AB (ref 4.0–5.6)

## 2019-01-05 MED ORDER — METFORMIN HCL 500 MG PO TABS: 500.0000 mg | ORAL_TABLET | Freq: Every day | ORAL | 2 refills | Status: DC

## 2019-01-06 ENCOUNTER — Other Ambulatory Visit: Payer: Self-pay | Admitting: Family Medicine

## 2019-01-06 DIAGNOSIS — E876 Hypokalemia: Secondary | ICD-10-CM

## 2019-01-06 LAB — CBC WITH DIFFERENTIAL/PLATELET
Basophils Absolute: 0.1 10*3/uL (ref 0.0–0.2)
Basos: 1 %
EOS (ABSOLUTE): 0.3 10*3/uL (ref 0.0–0.4)
Eos: 3 %
Hematocrit: 44.2 % (ref 34.0–46.6)
Hemoglobin: 14.9 g/dL (ref 11.1–15.9)
Immature Grans (Abs): 0 10*3/uL (ref 0.0–0.1)
Immature Granulocytes: 0 %
Lymphocytes Absolute: 4.6 10*3/uL — ABNORMAL HIGH (ref 0.7–3.1)
Lymphs: 47 %
MCH: 26 pg — ABNORMAL LOW (ref 26.6–33.0)
MCHC: 33.7 g/dL (ref 31.5–35.7)
MCV: 77 fL — ABNORMAL LOW (ref 79–97)
Monocytes Absolute: 0.6 10*3/uL (ref 0.1–0.9)
Monocytes: 6 %
Neutrophils Absolute: 4.3 10*3/uL (ref 1.4–7.0)
Neutrophils: 43 %
Platelets: 246 10*3/uL (ref 150–450)
RBC: 5.72 x10E6/uL — ABNORMAL HIGH (ref 3.77–5.28)
RDW: 15.1 % (ref 11.7–15.4)
WBC: 9.8 10*3/uL (ref 3.4–10.8)

## 2019-01-06 LAB — COMPREHENSIVE METABOLIC PANEL
ALT: 17 IU/L (ref 0–32)
AST: 13 IU/L (ref 0–40)
Albumin/Globulin Ratio: 1.2 (ref 1.2–2.2)
Albumin: 4.2 g/dL (ref 3.8–4.9)
Alkaline Phosphatase: 91 IU/L (ref 39–117)
BUN/Creatinine Ratio: 12 (ref 12–28)
BUN: 9 mg/dL (ref 8–27)
Bilirubin Total: 0.6 mg/dL (ref 0.0–1.2)
CO2: 24 mmol/L (ref 20–29)
Calcium: 9.7 mg/dL (ref 8.7–10.3)
Chloride: 96 mmol/L (ref 96–106)
Creatinine, Ser: 0.78 mg/dL (ref 0.57–1.00)
GFR calc Af Amer: 96 mL/min/{1.73_m2} (ref >59)
GFR calc non Af Amer: 83 mL/min/{1.73_m2} (ref >59)
Globulin, Total: 3.4 g/dL (ref 1.5–4.5)
Glucose: 126 mg/dL — ABNORMAL HIGH (ref 65–99)
Potassium: 3.2 mmol/L — ABNORMAL LOW (ref 3.5–5.2)
Sodium: 141 mmol/L (ref 134–144)
Total Protein: 7.6 g/dL (ref 6.0–8.5)

## 2019-01-06 LAB — LIPID PANEL
Chol/HDL Ratio: 2.8 ratio (ref 0.0–4.4)
Cholesterol, Total: 143 mg/dL (ref 100–199)
HDL: 51 mg/dL (ref >39)
LDL Calculated: 74 mg/dL (ref 0–99)
Triglycerides: 89 mg/dL (ref 0–149)
VLDL Cholesterol Cal: 18 mg/dL (ref 5–40)

## 2019-01-06 LAB — TSH: TSH: 1.37 u[IU]/mL (ref 0.450–4.500)

## 2019-01-06 LAB — T4, FREE: Free T4: 1.31 ng/dL (ref 0.82–1.77)

## 2019-01-06 MED ORDER — POTASSIUM CHLORIDE CRYS ER 20 MEQ PO TBCR: 20.0000 meq | EXTENDED_RELEASE_TABLET | Freq: Every day | ORAL | 0 refills | Status: DC

## 2019-01-06 NOTE — Addendum Note (Signed)
Addended by: Minette Headland A on: 01/06/2019 12:08 PM   Modules accepted: Orders

## 2019-01-07 LAB — CYTOLOGY - PAP
Diagnosis: NEGATIVE
HPV: NOT DETECTED

## 2019-01-09 ENCOUNTER — Other Ambulatory Visit: Payer: Self-pay | Admitting: Family Medicine

## 2019-01-09 DIAGNOSIS — E782 Mixed hyperlipidemia: Secondary | ICD-10-CM

## 2019-01-11 ENCOUNTER — Other Ambulatory Visit: Payer: BC Managed Care – PPO

## 2019-01-11 MED ORDER — ATORVASTATIN CALCIUM 20 MG PO TABS: 20.0000 mg | ORAL_TABLET | Freq: Every day | ORAL | 1 refills | Status: DC

## 2019-01-11 NOTE — Addendum Note (Signed)
Addended by: Minette Headland A on: 01/11/2019 08:49 AM   Modules accepted: Orders

## 2019-01-13 ENCOUNTER — Encounter: Payer: Self-pay | Admitting: Family Medicine

## 2019-01-13 ENCOUNTER — Ambulatory Visit: Payer: BC Managed Care – PPO | Admitting: Family Medicine

## 2019-01-13 ENCOUNTER — Other Ambulatory Visit: Payer: Self-pay

## 2019-01-13 VITALS — BP 110/68 | HR 100 | Temp 97.9°F | Wt 226.2 lb

## 2019-01-13 DIAGNOSIS — E876 Hypokalemia: Secondary | ICD-10-CM | POA: Diagnosis not present

## 2019-01-13 DIAGNOSIS — E119 Type 2 diabetes mellitus without complications: Secondary | ICD-10-CM | POA: Diagnosis not present

## 2019-01-13 NOTE — Patient Instructions (Signed)

## 2019-01-13 NOTE — Progress Notes (Signed)
   Subjective:    Patient ID: Holly Hendrix, female    DOB: [birth year], 58 y.o.   MRN: [id number]  HPI Chief Complaint  Patient presents with  . follow-up    follow-up on potassium   Here to follow up on hypokalemia. Most likely related to HCTZ 25 mg. She has been borderline low for some time.   Started on potassium supplement, 20 MEQ on 01/06/19. She did not start on the supplement until 3 days ago. States the pharmacy said it was not ready for her.   Started on Metformin once daily last week.  Having loose stools.   Denies fever, chills, dizziness, headache, chest pain, palpitations,  No muscle cramps or weakness.   She ate today at 9:30 am and labs done at 2 pm.     Review of Systems Pertinent positives and negatives in the history of present illness.     Objective:   Physical Exam BP 110/68   Pulse 100   Temp 97.9 F (36.6 C)   Wt 226 lb 3.2 oz (102.6 kg)   BMI 39.44 kg/m   Alert and oriented and in no acute distress.  Not otherwise examined.       Assessment & Plan:  Hypokalemia - Plan: Basic metabolic panel, Magnesium She is asymptomatic.  Suspect hypokalemia related to diuretic.  She has only been on potassium supplement for 3 days.  We will recheck potassium and add magnesium level today.  Adjust dose pending results.  Discussed potassium rich foods.  Controlled type 2 diabetes mellitus without complication, without long-term current use of insulin ([place]) - Plan: Basic metabolic panel.  Reports taking metformin and she is having diarrhea with it.  Discussed that if this does not resolve in the next few days to let me know.

## 2019-01-14 ENCOUNTER — Other Ambulatory Visit: Payer: Self-pay

## 2019-01-14 DIAGNOSIS — N950 Postmenopausal bleeding: Secondary | ICD-10-CM

## 2019-01-14 LAB — BASIC METABOLIC PANEL
BUN/Creatinine Ratio: 15 (ref 12–28)
BUN: 15 mg/dL (ref 8–27)
CO2: 25 mmol/L (ref 20–29)
Calcium: 9.5 mg/dL (ref 8.7–10.3)
Chloride: 102 mmol/L (ref 96–106)
Creatinine, Ser: 1 mg/dL (ref 0.57–1.00)
GFR calc Af Amer: 71 mL/min/{1.73_m2} (ref >59)
GFR calc non Af Amer: 61 mL/min/{1.73_m2} (ref >59)
Glucose: 135 mg/dL — ABNORMAL HIGH (ref 65–99)
Potassium: 3.8 mmol/L (ref 3.5–5.2)
Sodium: 142 mmol/L (ref 134–144)

## 2019-01-14 LAB — MAGNESIUM: Magnesium: 2 mg/dL (ref 1.6–2.3)

## 2019-01-14 NOTE — Progress Notes (Signed)
Error

## 2019-01-18 ENCOUNTER — Other Ambulatory Visit: Payer: Self-pay

## 2019-01-20 ENCOUNTER — Ambulatory Visit: Payer: BC Managed Care – PPO | Admitting: Obstetrics & Gynecology

## 2019-01-27 ENCOUNTER — Other Ambulatory Visit: Payer: Self-pay

## 2019-01-29 ENCOUNTER — Ambulatory Visit: Payer: BC Managed Care – PPO | Admitting: Obstetrics & Gynecology

## 2019-01-29 ENCOUNTER — Encounter: Payer: Self-pay | Admitting: Obstetrics & Gynecology

## 2019-01-29 ENCOUNTER — Other Ambulatory Visit: Payer: Self-pay

## 2019-01-29 VITALS — BP 114/70 | HR 76 | Temp 98.3°F | Resp 14 | Ht 63.25 in | Wt 224.0 lb

## 2019-01-29 DIAGNOSIS — N950 Postmenopausal bleeding: Secondary | ICD-10-CM

## 2019-01-29 NOTE — Progress Notes (Signed)
58 y.o. [id number] Divorced Black or [place] American female here for anew patient visit due to PMP bleeding.  Most recent bleeding was in August.  She spotted for a few days but also reports she's had intermittent spotting earlier this year.  She did have some cramping with this spotting the last time.  Is not on HRT and has never been on HRT.  Shared bleeding with Mack Hook who recommended additional evaluation.  She is here for this today.  Endometrial biopsy after exam and possible PUS recommended.  Pt is comfortable with this and ready to proceed with biopsy today.  No LMP recorded. Patient is postmenopausal.          The current method of family planning is post menopausal status.    Exercising: No.  Smoker:  no  Health Maintenance: Pap:  12/2018, neg pap and NEG HR HPV History of abnormal Pap:  no MMG:  11/18/2018 Colonoscopy:  10/2016.  Dr. Carlean Purl.  Follow up 10 years.   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 She has never used smokeless tobacco. She reports that she does not drink alcohol or use drugs.  Past Medical History:  Diagnosis Date  . Diabetes mellitus type 2, controlled, without complications ([place])   . Elevated LDL cholesterol level   . History of low potassium   . Hypertension     Past Surgical History:  Procedure Laterality Date  . INDUCED ABORTION      Current Outpatient Medications  Medication Sig Dispense Refill  . atorvastatin (LIPITOR) 20 MG tablet Take 1 tablet (20 mg total) by mouth daily. 90 tablet 1  . lisinopril-hydrochlorothiazide (ZESTORETIC) 20-25 MG tablet Take 1 tablet by mouth once daily 90 tablet 0  . metFORMIN (GLUCOPHAGE) 500 MG tablet Take 1 tablet (500 mg total) by mouth daily with breakfast. 30 tablet 2  . Naproxen Sodium (ALEVE PO) Take 1-2 tablets by mouth as needed.     . potassium  chloride SA (K-DUR) 20 MEQ tablet Take 1 tablet (20 mEq total) by mouth daily. 30 tablet 0   No current facility-administered medications for this visit.     Family History  Problem Relation Age of Onset  . Arthritis Sister   . Diabetes Sister   . Hypertension Sister   . Cancer Father        unknown   . Arthritis Brother   . Ulcers Brother   . Diabetes Sister   . Hypertension Sister   . Arthritis Sister   . Colon cancer Neg Hx     Review of Systems  All other systems reviewed and are negative.   Exam:   BP 114/70 (BP Location: Right Arm, Patient Position: Sitting, Cuff Size: Large)   Pulse 76   Temp 98.3 F (36.8 C) (Temporal)   Resp 14   Ht 5' 3.25" (1.607 m)   Wt 224 lb (101.6 kg)   BMI 39.37 kg/m    Height: 5' 3.25" (160.7 cm)  Ht Readings from Last 3 Encounters:  01/29/19 5' 3.25" (1.607 m)  01/05/19 5' 3.5" (1.613 m)  08/27/17 5\' 3"  (1.6 m)    General appearance: alert, cooperative and appears stated age Head: Normocephalic, without obvious abnormality, atraumatic Abdomen: soft, non-tender; bowel sounds normal; no masses,  no organomegaly Extremities: extremities normal, atraumatic, no  cyanosis or edema Skin: Skin color, texture, turgor normal. No rashes or lesions Lymph nodes: Cervical, supraclavicular, and axillary nodes normal. No abnormal inguinal nodes palpated Neurologic: Grossly normal  Pelvic: External genitalia:  no lesions              Urethra:  normal appearing urethra with no masses, tenderness or lesions              Bartholins and Skenes: normal                 Vagina: normal appearing vagina with normal color and discharge, no lesions              Cervix: no lesions              Pap taken: No. Bimanual Exam:  Uterus:  normal size, contour, position, consistency, mobility, non-tender              Adnexa: normal adnexa and no mass, fullness, tenderness               Rectovaginal: Confirms               Anus:  normal sphincter tone, no  lesions  Endometrial biopsy recommended.  Discussed with patient.  Verbal and written consent obtained.   Procedure:  Speculum placed.  Cervix visualized and cleansed with betadine prep.  A single toothed tenaculum was applied to the anterior lip of the cervix.  Endometrial pipelle was advanced through the cervix into the endometrial cavity without difficulty.  Pipelle passed to 9cm.  Suction applied and pipelle removed with scant tissue.  Two additional passes performed.  Scant tissue obtained but a globular tissue specimen was present c/w possible infarcted polyp (?).  This was sent to pathology too.  Tenculum removed.  No bleeding noted.  Patient tolerated procedure well.  Chaperone was present for exam.  A:  PMP bleeding Obesity Hypertension Lipids Type 2 diabetes  P:   Endometrial biopsy obtained today. Results will be called to pt.  If negative, will proceed with PUS. pap smear neg 12/2018 so not indicated today.

## 2019-01-29 NOTE — Patient Instructions (Signed)

## 2019-02-03 ENCOUNTER — Telehealth: Payer: Self-pay | Admitting: *Deleted

## 2019-02-03 DIAGNOSIS — N950 Postmenopausal bleeding: Secondary | ICD-10-CM

## 2019-02-03 NOTE — Telephone Encounter (Signed)
Call to patient. Results reviewed with patient as seen below from Dr. Sabra Heck and she verbalized understanding. Patient states she was told her insurance would not cover ultrasound, does not want to proceed if not covered. RN advised could place order for PUS and have insurance department call her with her benefits before she decided to schedule. Patient agreeable. Future order placed for PUS.   Routing to provider for review.   Cc Lerry Liner

## 2019-02-03 NOTE — Telephone Encounter (Signed)
-----   Message from Megan Salon, MD sent at 02/03/2019 12:32 PM EDT ----- Please let pt know that the biopsy showed what looked like an infarcted endometrial polyp.  I told her that is what I thought it was.  However, I do think she needs to have an ultrasound as well.  Please schedule.

## 2019-02-04 NOTE — Telephone Encounter (Signed)
Patient returned call. Reviewed benefit for recommended ultrasound. Patient acknowledges understanding of information presented. Patient is scheduled 02/11/2019 with Dr Sabra Heck. Patient is aware of appointment date, arrival time and cancellation policy. No further questions.  Forwarding to Dr Sabra Heck for final review. Patient is agreeable to disposition. Will close encounter

## 2019-02-04 NOTE — Telephone Encounter (Signed)
Call placed to review benefit and schedule recommended ultrasound. Left voicemail message requesting a return call

## 2019-02-09 ENCOUNTER — Other Ambulatory Visit: Payer: Self-pay

## 2019-02-11 ENCOUNTER — Ambulatory Visit (INDEPENDENT_AMBULATORY_CARE_PROVIDER_SITE_OTHER): Payer: BC Managed Care – PPO

## 2019-02-11 ENCOUNTER — Ambulatory Visit: Payer: BC Managed Care – PPO | Admitting: Obstetrics & Gynecology

## 2019-02-11 ENCOUNTER — Other Ambulatory Visit: Payer: Self-pay

## 2019-02-11 VITALS — BP 148/100 | HR 80 | Temp 98.0°F | Ht 63.25 in | Wt 223.0 lb

## 2019-02-11 DIAGNOSIS — N9489 Other specified conditions associated with female genital organs and menstrual cycle: Secondary | ICD-10-CM | POA: Diagnosis not present

## 2019-02-11 DIAGNOSIS — N950 Postmenopausal bleeding: Secondary | ICD-10-CM

## 2019-02-11 DIAGNOSIS — D251 Intramural leiomyoma of uterus: Secondary | ICD-10-CM

## 2019-02-11 DIAGNOSIS — R9389 Abnormal findings on diagnostic imaging of other specified body structures: Secondary | ICD-10-CM | POA: Diagnosis not present

## 2019-02-11 NOTE — Progress Notes (Signed)
58 y.o. [id number] Divorced Black or [place] American female here for pelvic ultrasound due to PMP bleeding.  Endometrial biopsy obtained at her initial office visit on 01/29/2019 was negative for abnormal cells but did show infarcted polyp tissue.  She is still having some spotting.  No LMP recorded. Patient is postmenopausal.  Contraception: PMP  Findings:  UTERUS: 8.9 x 4.0 x 4.4cm with single 1.1cm intramural fibroid EMS: 44mm with cystic spaces, avascular with possible endometrial mass noted ADNEXA: Left ovary: 2.3 x 1.0 x 1.3cm       Right ovary: 2.2 x 1.1 x 1.0cm, both ovaries appear atrophic CUL DE SAC: no free fluid  Discussion:  Findings reviewed with pt.  Given PMP status, continued spotting, infarcted polyp on endometrial biopsy and 59mm endometrium with possible mass present, hysteroscopy and removal of probable endometrial polyp recommended.    Procedure discussed with patient.  Recovery and pain management discussed.  Risks discussed including but not limited to bleeding, rare risk of transfusion, infection, 1% risk of uterine perforation with risks of fluid deficit causing cardiac arrythmia, cerebral swelling and/or need to stop procedure early.  Fluid emboli and rare risk of death discussed.  DVT/PE, rare risk of risk of bowel/bladder/ureteral/vascular injury.  Patient aware if pathology abnormal she may need additional treatment.  All questions answered.    Assessment:  Thickened endometrium PMP bleeding Probable endometrial polyp Intramural fibroid  Plan:  Hysteroscopy with possible polyp removal, D&C recommended.  Pt is concerned about cost.  Will precert for her first.  ~25 minutes spent with patient >50% of time was in face to face discussion of above.

## 2019-02-14 ENCOUNTER — Encounter: Payer: Self-pay | Admitting: Obstetrics & Gynecology

## 2019-02-14 DIAGNOSIS — D251 Intramural leiomyoma of uterus: Secondary | ICD-10-CM | POA: Insufficient documentation

## 2019-02-14 DIAGNOSIS — N9489 Other specified conditions associated with female genital organs and menstrual cycle: Secondary | ICD-10-CM | POA: Insufficient documentation

## 2019-02-18 ENCOUNTER — Telehealth: Payer: Self-pay | Admitting: Obstetrics & Gynecology

## 2019-02-18 NOTE — Telephone Encounter (Signed)
Call placed to patient to review benefit for recommended surgical procedure. Left voicemail message requesting a return call   cc: Lamont Snowball, RN

## 2019-02-18 NOTE — Telephone Encounter (Signed)
Patient returned call. Reviewed benefit for recommended surgery. Patient acknowledges understanding of information presented. Patient advises she will not be able to confirm until possibly 03/04/2019, but has questions regarding benefits for the hospital. I have provided patient with the phone number to the [facility] [phone]. Patient will call back when ready to proceed.   cc: Lamont Snowball, RN

## 2019-03-03 ENCOUNTER — Other Ambulatory Visit: Payer: Self-pay | Admitting: Family Medicine

## 2019-03-03 DIAGNOSIS — E876 Hypokalemia: Secondary | ICD-10-CM

## 2019-03-15 NOTE — Telephone Encounter (Signed)
Call to patient. Left message to call back regarding plans for scheduling.

## 2019-03-26 NOTE — Telephone Encounter (Signed)
Follow-up call to patient regarding surgery plans.  States she wants to hold off on this, possibly till next year. Reviewed importance of complete evaluation of PMB.  She continues to proceed at this time.  Stressed to call back as soon as able to proceed.   Routing to provider for review.

## 2019-04-07 ENCOUNTER — Encounter: Payer: Self-pay | Admitting: Family Medicine

## 2019-04-07 ENCOUNTER — Other Ambulatory Visit: Payer: Self-pay

## 2019-04-07 ENCOUNTER — Ambulatory Visit: Payer: BC Managed Care – PPO | Admitting: Family Medicine

## 2019-04-07 VITALS — BP 128/90 | HR 104 | Wt 219.2 lb

## 2019-04-07 DIAGNOSIS — I10 Essential (primary) hypertension: Secondary | ICD-10-CM

## 2019-04-07 DIAGNOSIS — E119 Type 2 diabetes mellitus without complications: Secondary | ICD-10-CM

## 2019-04-07 DIAGNOSIS — E785 Hyperlipidemia, unspecified: Secondary | ICD-10-CM

## 2019-04-07 DIAGNOSIS — Z6838 Body mass index (BMI) 38.0-38.9, adult: Secondary | ICD-10-CM

## 2019-04-07 DIAGNOSIS — D72820 Lymphocytosis (symptomatic): Secondary | ICD-10-CM | POA: Diagnosis not present

## 2019-04-07 DIAGNOSIS — E782 Mixed hyperlipidemia: Secondary | ICD-10-CM

## 2019-04-07 DIAGNOSIS — E6609 Other obesity due to excess calories: Secondary | ICD-10-CM

## 2019-04-07 LAB — POCT GLYCOSYLATED HEMOGLOBIN (HGB A1C): Hemoglobin A1C: 6.6 % — AB (ref 4.0–5.6)

## 2019-04-07 MED ORDER — METFORMIN HCL 500 MG PO TABS: 500.0000 mg | ORAL_TABLET | Freq: Every day | ORAL | 2 refills | Status: DC

## 2019-04-07 MED ORDER — ATORVASTATIN CALCIUM 20 MG PO TABS: 20.0000 mg | ORAL_TABLET | Freq: Every day | ORAL | 1 refills | Status: DC

## 2019-04-07 NOTE — Patient Instructions (Addendum)
Your hemoglobin A1c is 6.6% today and this has improved. Continue on the Metformin once daily with breakfast.  Cut back on sugar and carbohydrates (bread, potatoes, rice, pasta).   Continue on all of your current medications.   Call and schedule a diabetic eye exam.   I would like to see you back in 4 weeks since your blood pressure is elevated today.    DASH Eating Plan DASH stands for "Dietary Approaches to Stop Hypertension." The DASH eating plan is a healthy eating plan that has been shown to reduce high blood pressure (hypertension). It may also reduce your risk for type 2 diabetes, heart disease, and stroke. The DASH eating plan may also help with weight loss. What are tips for following this plan?  General guidelines  Avoid eating more than 2,300 mg (milligrams) of salt (sodium) a day. If you have hypertension, you may need to reduce your sodium intake to 1,500 mg a day.  Limit alcohol intake to no more than 1 drink a day for nonpregnant women and 2 drinks a day for men. One drink equals 12 oz of beer, 5 oz of wine, or 1 oz of hard liquor.  Work with your health care provider to maintain a healthy body weight or to lose weight. Ask what an ideal weight is for you.  Get at least 30 minutes of exercise that causes your heart to beat faster (aerobic exercise) most days of the week. Activities may include walking, swimming, or biking.  Work with your health care provider or diet and nutrition specialist (dietitian) to adjust your eating plan to your individual calorie needs. Reading food labels   Check food labels for the amount of sodium per serving. Choose foods with less than 5 percent of the Daily Value of sodium. Generally, foods with less than 300 mg of sodium per serving fit into this eating plan.  To find whole grains, look for the word "whole" as the first word in the ingredient list. Shopping  Buy products labeled as "low-sodium" or "no salt added."  Buy fresh foods.  Avoid canned foods and premade or frozen meals. Cooking  Avoid adding salt when cooking. Use salt-free seasonings or herbs instead of table salt or sea salt. Check with your health care provider or pharmacist before using salt substitutes.  Do not fry foods. Cook foods using healthy methods such as baking, boiling, grilling, and broiling instead.  Cook with heart-healthy oils, such as olive, canola, soybean, or sunflower oil. Meal planning  Eat a balanced diet that includes: ? 5 or more servings of fruits and vegetables each day. At each meal, try to fill half of your plate with fruits and vegetables. ? Up to 6-8 servings of whole grains each day. ? Less than 6 oz of lean meat, poultry, or fish each day. A 3-oz serving of meat is about the same size as a deck of cards. One egg equals 1 oz. ? 2 servings of low-fat dairy each day. ? A serving of nuts, seeds, or beans 5 times each week. ? Heart-healthy fats. Healthy fats called Omega-3 fatty acids are found in foods such as flaxseeds and coldwater fish, like sardines, salmon, and mackerel.  Limit how much you eat of the following: ? Canned or prepackaged foods. ? Food that is high in trans fat, such as fried foods. ? Food that is high in saturated fat, such as fatty meat. ? Sweets, desserts, sugary drinks, and other foods with added sugar. ? Full-fat dairy  products.  Do not salt foods before eating.  Try to eat at least 2 vegetarian meals each week.  Eat more home-cooked food and less restaurant, buffet, and fast food.  When eating at a restaurant, ask that your food be prepared with less salt or no salt, if possible. What foods are recommended? The items listed may not be a complete list. Talk with your dietitian about what dietary choices are best for you. Grains Whole-grain or whole-wheat bread. Whole-grain or whole-wheat pasta. Brown rice. Modena Morrow. Bulgur. Whole-grain and low-sodium cereals. Pita bread. Low-fat, low-sodium  crackers. Whole-wheat flour tortillas. Vegetables Fresh or frozen vegetables (raw, steamed, roasted, or grilled). Low-sodium or reduced-sodium tomato and vegetable juice. Low-sodium or reduced-sodium tomato sauce and tomato paste. Low-sodium or reduced-sodium canned vegetables. Fruits All fresh, dried, or frozen fruit. Canned fruit in natural juice (without added sugar). Meat and other protein foods Skinless chicken or [place]. Ground chicken or [place]. Pork with fat trimmed off. Fish and seafood. Egg whites. Dried beans, peas, or lentils. Unsalted nuts, nut butters, and seeds. Unsalted canned beans. Lean cuts of beef with fat trimmed off. Low-sodium, lean deli meat. Dairy Low-fat (1%) or fat-free (skim) milk. Fat-free, low-fat, or reduced-fat cheeses. Nonfat, low-sodium ricotta or cottage cheese. Low-fat or nonfat yogurt. Low-fat, low-sodium cheese. Fats and oils Soft margarine without trans fats. Vegetable oil. Low-fat, reduced-fat, or light mayonnaise and salad dressings (reduced-sodium). Canola, safflower, olive, soybean, and sunflower oils. Avocado. Seasoning and other foods Herbs. Spices. Seasoning mixes without salt. Unsalted popcorn and pretzels. Fat-free sweets. What foods are not recommended? The items listed may not be a complete list. Talk with your dietitian about what dietary choices are best for you. Grains Baked goods made with fat, such as croissants, muffins, or some breads. Dry pasta or rice meal packs. Vegetables Creamed or fried vegetables. Vegetables in a cheese sauce. Regular canned vegetables (not low-sodium or reduced-sodium). Regular canned tomato sauce and paste (not low-sodium or reduced-sodium). Regular tomato and vegetable juice (not low-sodium or reduced-sodium). Angie Fava. Olives. Fruits Canned fruit in a light or heavy syrup. Fried fruit. Fruit in cream or butter sauce. Meat and other protein foods Fatty cuts of meat. Ribs. Fried meat. Berniece Salines. Sausage. Bologna and  other processed lunch meats. Salami. Fatback. Hotdogs. Bratwurst. Salted nuts and seeds. Canned beans with added salt. Canned or smoked fish. Whole eggs or egg yolks. Chicken or [place] with skin. Dairy Whole or 2% milk, cream, and half-and-half. Whole or full-fat cream cheese. Whole-fat or sweetened yogurt. Full-fat cheese. Nondairy creamers. Whipped toppings. Processed cheese and cheese spreads. Fats and oils Butter. Stick margarine. Lard. Shortening. Ghee. Bacon fat. Tropical oils, such as coconut, palm kernel, or palm oil. Seasoning and other foods Salted popcorn and pretzels. Onion salt, garlic salt, seasoned salt, table salt, and sea salt. Worcestershire sauce. Tartar sauce. Barbecue sauce. Teriyaki sauce. Soy sauce, including reduced-sodium. Steak sauce. Canned and packaged gravies. Fish sauce. Oyster sauce. Cocktail sauce. Horseradish that you find on the shelf. Ketchup. Mustard. Meat flavorings and tenderizers. Bouillon cubes. Hot sauce and Tabasco sauce. Premade or packaged marinades. Premade or packaged taco seasonings. Relishes. Regular salad dressings. Where to find more information:  National Heart, Lung, and [place]: https://wilson-eaton.com/  American Heart Association: www.heart.org Summary  The DASH eating plan is a healthy eating plan that has been shown to reduce high blood pressure (hypertension). It may also reduce your risk for type 2 diabetes, heart disease, and stroke.  With the DASH eating plan, you should limit  salt (sodium) intake to 2,300 mg a day. If you have hypertension, you may need to reduce your sodium intake to 1,500 mg a day.  When on the DASH eating plan, aim to eat more fresh fruits and vegetables, whole grains, lean proteins, low-fat dairy, and heart-healthy fats.  Work with your health care provider or diet and nutrition specialist (dietitian) to adjust your eating plan to your individual calorie needs. This information is not intended to replace advice  given to you by your health care provider. Make sure you discuss any questions you have with your health care provider. Document Released: 04/25/2011 Document Revised: 04/18/2017 Document Reviewed: 04/29/2016 Elsevier Patient Education  2020 [employer].

## 2019-04-07 NOTE — Progress Notes (Signed)
   Subjective:    Patient ID: Holly Hendrix, female    DOB: 12/18/1960, 58 y.o.   MRN: [id number]  HPI Chief Complaint  Patient presents with  . 3 month follow-up    3 month follow-up on htn. not been checking. needs refill on meds   Here for follow up on diabetes, HTN   Diabetes was previously well controlled and then she had a spike in her hemoglobin A1c of 7.7% 3 months ago.  She started on Metformin 500 mg once daily.  Has lost 4 lbs by cutting back on sweets and carbohydrates. Still drinking soda.  No exercise. Active at school and works in [employer].   History of hyperkalemia- taking 10 MEQ daily  HTN- does not check BP at home.  Reports taking lisinopril HCTZ daily  HL-reports taking atorvastatin daily without side effects  History of mildly elevated lymphocytes.  We are monitoring this  Other providers:  Dr. Sabra Heck- OB/GYN -recently saw her for postmenopausal bleeding   Denies fever, chills, night sweats, headache, dizziness, chest pain, palpitations, shortness of breath, abdominal pain, nausea, vomiting, diarrhea  Diabetic eye exam- did not get this yet   States she had a tetanus in 2016 at [facility]   No new concerns today.    Review of Systems Pertinent positives and negatives in the history of present illness.     Objective:   Physical Exam BP 128/90   Pulse (!) 104   Wt 219 lb 3.2 oz (99.4 kg)   BMI 38.52 kg/m   Alert oriented and in no acute distress.  Neck is supple no adenopathy. cardiac exam shows a regular sinus rhythm. Lungs are clear to auscultation.  Extremities without edema and distal pulses intact.  DTRs normal and symmetric      Assessment & Plan:  Controlled type 2 diabetes mellitus without complication, without long-term current use of insulin (HCC) -Hemoglobin A1c is improved to 6.6%.  She is doing fine on the metformin.  She would like to stop the Metformin however, I strongly encouraged her to continue taking it.  She is not  checking her blood sugars at home and I am fine with this.  Counseling on healthy diet and exercise.  HYPERTENSION, BENIGN ESSENTIAL -Blood pressure is elevated today.  She does not check her blood pressure at home.  Reports good daily compliance with medication.  Counseling on low-sodium diet.  I recommend that she return in 4 weeks for follow-up on blood pressure.  Lymphocytosis-recheck and if still elevated consider pathologist review of peripheral blood smear  Dyslipidemia-continue on statin therapy  Class 2 obesity due to excess calories without serious comorbidity with body mass index (BMI) of 38.0 to 38.9 in adult-counseled on healthy diet and exercise.  She has lost 4 pounds and I congratulated her on this

## 2019-04-08 ENCOUNTER — Other Ambulatory Visit: Payer: Self-pay | Admitting: Internal Medicine

## 2019-04-08 LAB — CBC WITH DIFFERENTIAL/PLATELET
Basophils Absolute: 0.1 10*3/uL (ref 0.0–0.2)
Basos: 1 %
EOS (ABSOLUTE): 0.3 10*3/uL (ref 0.0–0.4)
Eos: 4 %
Hematocrit: 42.6 % (ref 34.0–46.6)
Hemoglobin: 14.1 g/dL (ref 11.1–15.9)
Immature Grans (Abs): 0 10*3/uL (ref 0.0–0.1)
Immature Granulocytes: 0 %
Lymphocytes Absolute: 4 10*3/uL — ABNORMAL HIGH (ref 0.7–3.1)
Lymphs: 45 %
MCH: 26 pg — ABNORMAL LOW (ref 26.6–33.0)
MCHC: 33.1 g/dL (ref 31.5–35.7)
MCV: 79 fL (ref 79–97)
Monocytes Absolute: 0.5 10*3/uL (ref 0.1–0.9)
Monocytes: 6 %
Neutrophils Absolute: 3.8 10*3/uL (ref 1.4–7.0)
Neutrophils: 44 %
Platelets: 229 10*3/uL (ref 150–450)
RBC: 5.43 x10E6/uL — ABNORMAL HIGH (ref 3.77–5.28)
RDW: 14.5 % (ref 11.7–15.4)
WBC: 8.6 10*3/uL (ref 3.4–10.8)

## 2019-04-08 LAB — COMPREHENSIVE METABOLIC PANEL
ALT: 13 IU/L (ref 0–32)
AST: 16 IU/L (ref 0–40)
Albumin/Globulin Ratio: 1.2 (ref 1.2–2.2)
Albumin: 3.9 g/dL (ref 3.8–4.9)
Alkaline Phosphatase: 77 IU/L (ref 39–117)
BUN/Creatinine Ratio: 21 (ref 12–28)
BUN: 14 mg/dL (ref 8–27)
Bilirubin Total: 0.6 mg/dL (ref 0.0–1.2)
CO2: 25 mmol/L (ref 20–29)
Calcium: 9.6 mg/dL (ref 8.7–10.3)
Chloride: 103 mmol/L (ref 96–106)
Creatinine, Ser: 0.67 mg/dL (ref 0.57–1.00)
GFR calc Af Amer: 110 mL/min/{1.73_m2} (ref >59)
GFR calc non Af Amer: 96 mL/min/{1.73_m2} (ref >59)
Globulin, Total: 3.3 g/dL (ref 1.5–4.5)
Glucose: 98 mg/dL (ref 65–99)
Potassium: 3.5 mmol/L (ref 3.5–5.2)
Sodium: 142 mmol/L (ref 134–144)
Total Protein: 7.2 g/dL (ref 6.0–8.5)

## 2019-04-08 MED ORDER — POTASSIUM CHLORIDE CRYS ER 20 MEQ PO TBCR: EXTENDED_RELEASE_TABLET | ORAL | 0 refills | Status: DC

## 2019-05-05 ENCOUNTER — Ambulatory Visit: Payer: BC Managed Care – PPO | Admitting: Family Medicine

## 2019-06-02 ENCOUNTER — Other Ambulatory Visit: Payer: Self-pay | Admitting: Family Medicine

## 2019-06-17 ENCOUNTER — Telehealth: Payer: Self-pay | Admitting: Obstetrics & Gynecology

## 2019-06-17 NOTE — Telephone Encounter (Signed)
See previous phone notes. Call placed to follow up with patient in regards to surgery plans. Patient states she never heard back form the [facility] and does not want to schedule until she speaks with them. Provided to patient the [facility] phone number [phone]. Patient states she will contact them and then will return call to our office for scheduling.  I have reviewed benefit information for our office. Patient understands this is the benefit for our office only. Patient is aware of the surgery cancellation policy.   Patient to call back once she speaks with the [place].    Cc: Reesa Chew, RN         [facility]

## 2019-06-18 ENCOUNTER — Other Ambulatory Visit: Payer: Self-pay | Admitting: Family Medicine

## 2019-06-28 NOTE — Telephone Encounter (Signed)
Left message to call [place] at [phone].  Calling to follow up on surgery scheduling.

## 2019-07-07 ENCOUNTER — Other Ambulatory Visit: Payer: Self-pay | Admitting: Family Medicine

## 2019-07-07 DIAGNOSIS — E119 Type 2 diabetes mellitus without complications: Secondary | ICD-10-CM

## 2019-07-16 NOTE — Telephone Encounter (Signed)
Left message to call Kristoffer Bala at [phone]. 

## 2019-07-19 NOTE — Telephone Encounter (Signed)
Dr.Miller, patient has not returned call to office regarding status of planning for surgery. Please advise.

## 2019-07-27 ENCOUNTER — Encounter: Payer: Self-pay | Admitting: Obstetrics & Gynecology

## 2019-07-27 NOTE — Telephone Encounter (Signed)
Letter written and signed.  Please send.  Pt clearly aware of recommendation.  Ok to file surgery chart and stop with communication.

## 2019-07-29 NOTE — Telephone Encounter (Signed)
Letter sent. Surgery chart filed. Encounter closed.

## 2019-08-06 ENCOUNTER — Telehealth: Payer: Self-pay | Admitting: Obstetrics & Gynecology

## 2019-08-06 NOTE — Telephone Encounter (Signed)
Patient want to discuss surgery options.

## 2019-08-06 NOTE — Telephone Encounter (Signed)
Spoke with patient. Patient would like to proceed with Hysteroscopy myosure D&C. Advised will have insurance and billing department contact her regarding benefits and then we can proceed with scheduling. Patient is agreeable.

## 2019-08-06 NOTE — Telephone Encounter (Signed)
Spoke to pt. Pt wanting to speak to Dr Sabra Heck and surgery scheduler after receiving our letter from Dr Sabra Heck.   Routing to [place], Therapist, sports to talk about surgery.

## 2019-08-31 ENCOUNTER — Other Ambulatory Visit: Payer: Self-pay | Admitting: Family Medicine

## 2019-08-31 NOTE — Telephone Encounter (Signed)
Patient is returning call to Hayley. 

## 2019-08-31 NOTE — Telephone Encounter (Signed)
Call to patient. Per DPR, OK to leave message on voicemail.   Left voicemail requesting a return call to [facility] to review benefits for surgery with M. Edwinna Areola, MD

## 2019-09-01 NOTE — Telephone Encounter (Signed)
Left message to return call to Hayley at [phone] 

## 2019-09-02 NOTE — Telephone Encounter (Signed)
Spoke with patient. Possible surgery dates reviewed. Patient needs to speak with employer and return call to set date for surgery.

## 2019-09-02 NOTE — Telephone Encounter (Signed)
Spoke with patient regarding surgery benefits. Patient acknowledges understanding of information presented. Patient is aware that benefits presented are for professional benefits only. Patient is aware that once surgery is scheduled, the hospital will call with separate benefits. Patient is aware of surgery cancellation policy. 

## 2019-09-03 NOTE — Telephone Encounter (Signed)
Patient is asking to talk with [facility] regarding surgery dates.

## 2019-09-06 ENCOUNTER — Telehealth: Payer: Self-pay | Admitting: Obstetrics & Gynecology

## 2019-09-06 NOTE — Telephone Encounter (Signed)
Patient returned a call to Kaitlyn. °

## 2019-09-06 NOTE — Telephone Encounter (Signed)
Patient said she is returning a call to [place].

## 2019-09-06 NOTE — Telephone Encounter (Signed)
Left message to call Birt Reinoso at [phone]. 

## 2019-09-06 NOTE — Telephone Encounter (Signed)
Left message to call [name] at [phone]. 

## 2019-09-06 NOTE — Telephone Encounter (Signed)
Patient would like to proceed with surgery on 09/27/2019. Advised will request with [facility] and return call.

## 2019-09-08 NOTE — Telephone Encounter (Signed)
Surgery scheduled for 09/27/2019 at 11:15 am [facility]. Pre op 09/16/2019 at 2:30 pm with Dr.Miller. COVID test 09/23/2019 at 2:15 pm at [facility]. Patient is aware of the need to quarantine after test until surgery.2 week post op 10/11/2019 at 2:30 pm with Dr.Miller. Patient is agreeable to all dates and times. Surgery instructions reviewed. Patient would like to pick up surgery instructions at her pre op. Instructions to Joy's desk.  Routing to provider and will close encounter.

## 2019-09-13 ENCOUNTER — Other Ambulatory Visit: Payer: Self-pay | Admitting: Obstetrics & Gynecology

## 2019-09-15 ENCOUNTER — Other Ambulatory Visit: Payer: Self-pay

## 2019-09-15 NOTE — Telephone Encounter (Signed)
Please see telephone encounter dated 09/06/2019.

## 2019-09-16 ENCOUNTER — Other Ambulatory Visit: Payer: Self-pay

## 2019-09-16 ENCOUNTER — Ambulatory Visit: Payer: BC Managed Care – PPO | Admitting: Obstetrics & Gynecology

## 2019-09-16 ENCOUNTER — Encounter: Payer: Self-pay | Admitting: Obstetrics & Gynecology

## 2019-09-16 VITALS — BP 136/94 | HR 70 | Temp 97.7°F | Resp 16 | Wt 220.0 lb

## 2019-09-16 DIAGNOSIS — R9389 Abnormal findings on diagnostic imaging of other specified body structures: Secondary | ICD-10-CM | POA: Diagnosis not present

## 2019-09-16 DIAGNOSIS — N950 Postmenopausal bleeding: Secondary | ICD-10-CM | POA: Diagnosis not present

## 2019-09-16 DIAGNOSIS — N9489 Other specified conditions associated with female genital organs and menstrual cycle: Secondary | ICD-10-CM

## 2019-09-16 NOTE — Patient Instructions (Signed)
[facility] [address] [place],  [place]  [address]  Main: [phone]

## 2019-09-16 NOTE — Progress Notes (Signed)
59 y.o. [id number] Divorced Black or [place] American female here prior to surgery to review testing done last year, recommendations for treatment and to update history.  Pt was seen last year due to PMP bleeding.  Endometrial biopsy showed infarcrted polyp and ultrasound was recommended.  This was done 02/11/2019 showing 83m endometrial thickness with cystic spaces c/w polyp.  Hysteroscopy with possible polyp resection, D&C was recommended.  Pt did not schedule at that time and recently called to have procedure scheduled.  I recommended OV as I have not seen her in >6 months.  Medical hx, surgical hx, social hx, medications and allergies all updated today.  Prior testing and results reviewed with pt.  Recommended procedure discussed.  Procedure discussed with patient.  Recovery and pain management discussed.  Risks discussed including but not limited to bleeding, rare risk of transfusion, infection, 1% risk of uterine perforation with risks of fluid deficit causing cardiac arrythmia, cerebral swelling and/or need to stop procedure early.  Fluid emboli and rare risk of death discussed.  DVT/PE, rare risk of risk of bowel/bladder/ureteral/vascular injury.  Patient aware if pathology abnormal she may need additional treatment.  All questions answered.  Pt is ready to proceed with recommended treatment plan.  Ob Hx:   No LMP recorded. Patient is postmenopausal.          Sexually active: No. Birth control: abstinence Last pap: 12/26/2018 Last MMG: 11/18/2018 Tobacco: non smoker  Past Surgical History:  Procedure Laterality Date  . INDUCED ABORTION      Past Medical History:  Diagnosis Date  . Diabetes mellitus type 2, controlled, without complications ([place])   . Elevated LDL cholesterol level   . History of low potassium   . Hypertension     Allergies: Patient has no known allergies.  Current Outpatient Medications  Medication Sig Dispense Refill  . Acetaminophen (TYLENOL PO) Take by mouth.    Marland Kitchen  atorvastatin (LIPITOR) 20 MG tablet Take 1 tablet (20 mg total) by mouth daily. 90 tablet 1  . lisinopril-hydrochlorothiazide (ZESTORETIC) 20-25 MG tablet Take 1 tablet by mouth once daily 90 tablet 0  . metFORMIN (GLUCOPHAGE) 500 MG tablet Take 1 tablet by mouth once daily with breakfast 30 tablet 2  . potassium chloride SA (KLOR-CON) 20 MEQ tablet TAKE 1  BY MOUTH ONCE DAILY 90 tablet 0   No current facility-administered medications for this visit.    ROS: Pertinent items noted in HPI and remainder of comprehensive ROS otherwise negative.  Exam:    BP (!) 136/94   Pulse 70   Temp 97.7 F (36.5 C) (Skin)   Resp 16   Wt 220 lb (99.8 kg)   BMI 38.66 kg/m   General appearance: alert and cooperative Head: Normocephalic, without obvious abnormality, atraumatic Neck: no adenopathy, supple, symmetrical, trachea midline and thyroid not enlarged, symmetric, no tenderness/mass/nodules Lungs: clear to auscultation bilaterally Heart: regular rate and rhythm, S1, S2 normal, no murmur, click, rub or gallop Abdomen: soft, non-tender; bowel sounds normal; no masses,  no organomegaly Extremities: extremities normal, atraumatic, no cyanosis or edema Skin: Skin color, texture, turgor normal. No rashes or lesions Lymph nodes: Cervical, supraclavicular, and axillary nodes normal. no inguinal nodes palpated Neurologic: Grossly normal  Pelvic: External genitalia:  no lesions              Urethra: normal appearing urethra with no masses, tenderness or lesions              Bartholins and Skenes: Bartholin's, Urethra,  Skene's normal                 Vagina: normal appearing vagina with normal color and discharge, no lesions              Cervix: normal appearance              Pap taken: No.        Bimanual Exam:  Uterus:  uterus is normal size, shape, consistency and nontender                                      Adnexa:    normal adnexa in size, nontender and no masses                                       Rectovaginal: [id number]}  Chaperone, Royal Hawthorn, CMA, present for examination.   A: PMP bleeding, no HRT H/o endometrial mass and negative endometrial biopsy, last seen in September Type II diabetes Hypertension Elevated lipids     P:  Hysteroscopy with polyp resection, D&C is planned and I feel it is appropriate to proceed at this time. Postoperative pain management is reviewed. Medications/Vitamins reviewed.   Post procedure instructions reviewed.  In addition to reviewing recommended procedure including risks and benefits, about 20 minutes was spent with the patient reviewing her her medical history and updating this in the chart.  As well we reviewed the findings from last year and received high recommendations.

## 2019-09-17 ENCOUNTER — Encounter (HOSPITAL_BASED_OUTPATIENT_CLINIC_OR_DEPARTMENT_OTHER): Payer: Self-pay | Admitting: Obstetrics & Gynecology

## 2019-09-17 ENCOUNTER — Other Ambulatory Visit: Payer: Self-pay

## 2019-09-17 NOTE — Progress Notes (Signed)
Spoke w/ via phone for pre-op interview---patient Lab needs dos----  none            Lab results------has lab appt 09-23-2019 at 1445 for cbc, bmet, ekg COVID test ------09-23-2019 at 1415 Arrive at -------715 am 09-27-2019 NPO after ------midnight Medications to take morning of surgery -----none Diabetic medication -----none morning of surgery Patient Special Instructions -----none Pre-Op special Istructions -----none Patient verbalized understanding of instructions that were given at this phone interview. Patient denies shortness of breath, chest pain, fever, cough a this phone interview.

## 2019-09-23 ENCOUNTER — Other Ambulatory Visit (HOSPITAL_COMMUNITY)
Admission: RE | Admit: 2019-09-23 | Discharge: 2019-09-23 | Disposition: A | Discharge status: Home / Self Care | Payer: BC Managed Care – PPO | Source: Ambulatory Visit | Attending: Obstetrics & Gynecology | Admitting: Obstetrics & Gynecology

## 2019-09-23 ENCOUNTER — Other Ambulatory Visit: Payer: Self-pay

## 2019-09-23 ENCOUNTER — Encounter (HOSPITAL_COMMUNITY)
Admission: RE | Admit: 2019-09-23 | Discharge: 2019-09-23 | Disposition: A | Discharge status: Home / Self Care | Payer: BC Managed Care – PPO | Source: Ambulatory Visit | Attending: Obstetrics & Gynecology | Admitting: Obstetrics & Gynecology

## 2019-09-23 DIAGNOSIS — Z20822 Contact with and (suspected) exposure to covid-19: Secondary | ICD-10-CM | POA: Insufficient documentation

## 2019-09-23 DIAGNOSIS — Z01818 Encounter for other preprocedural examination: Secondary | ICD-10-CM | POA: Insufficient documentation

## 2019-09-23 LAB — BASIC METABOLIC PANEL
Anion gap: 10 (ref 5–15)
BUN: 17 mg/dL (ref 6–20)
CO2: 29 mmol/L (ref 22–32)
Calcium: 9.3 mg/dL (ref 8.9–10.3)
Chloride: 105 mmol/L (ref 98–111)
Creatinine, Ser: 0.85 mg/dL (ref 0.44–1.00)
GFR calc Af Amer: >60 mL/min (ref >60)
GFR calc non Af Amer: >60 mL/min (ref >60)
Glucose, Bld: 102 mg/dL — ABNORMAL HIGH (ref 70–99)
Potassium: 3.5 mmol/L (ref 3.5–5.1)
Sodium: 144 mmol/L (ref 135–145)

## 2019-09-23 LAB — CBC
HCT: 44.9 % (ref 36.0–46.0)
Hemoglobin: 14 g/dL (ref 12.0–15.0)
MCH: 25.4 pg — ABNORMAL LOW (ref 26.0–34.0)
MCHC: 31.2 g/dL (ref 30.0–36.0)
MCV: 81.5 fL (ref 80.0–100.0)
Platelets: 274 10*3/uL (ref 150–400)
RBC: 5.51 MIL/uL — ABNORMAL HIGH (ref 3.87–5.11)
RDW: 15.1 % (ref 11.5–15.5)
WBC: 10 10*3/uL (ref 4.0–10.5)
nRBC: 0 % (ref 0.0–0.2)

## 2019-09-23 LAB — SARS CORONAVIRUS 2 (TAT 6-24 HRS): SARS Coronavirus 2: NEGATIVE

## 2019-09-26 NOTE — Anesthesia Preprocedure Evaluation (Addendum)
Anesthesia Evaluation  Patient identified by MRN, date of birth, ID band Patient awake    Reviewed: Allergy & Precautions, NPO status , Unable to perform ROS - Chart review only  Airway Mallampati: II  TM Distance: >3 FB Neck ROM: Full    Dental no notable dental hx. (+) Teeth Intact, Dental Advisory Given,    Pulmonary neg pulmonary ROS,    Pulmonary exam normal breath sounds clear to auscultation       Cardiovascular hypertension, Pt. on medications Normal cardiovascular exam Rhythm:Regular Rate:Normal     Neuro/Psych  Headaches, negative neurological ROS  negative psych ROS   GI/Hepatic negative GI ROS, Neg liver ROS,   Endo/Other  diabetes, Type 2, Oral Hypoglycemic Agents  Renal/GU negative Renal ROS  negative genitourinary   Musculoskeletal negative musculoskeletal ROS (+)   Abdominal (+) + obese,   Peds  Hematology negative hematology ROS (+)   Anesthesia Other Findings   Reproductive/Obstetrics                            Anesthesia Physical Anesthesia Plan  ASA: III  Anesthesia Plan: General   Post-op Pain Management:    Induction: Intravenous  PONV Risk Score and Plan: 4 or greater and Treatment may vary due to age or medical condition, Ondansetron, Dexamethasone and Midazolam  Airway Management Planned: LMA  Additional Equipment: None  Intra-op Plan:   Post-operative Plan:   Informed Consent: I have reviewed the patients History and Physical, chart, labs and discussed the procedure including the risks, benefits and alternatives for the proposed anesthesia with the patient or authorized representative who has indicated his/her understanding and acceptance.     Dental advisory given  Plan Discussed with: CRNA  Anesthesia Plan Comments:        Anesthesia Quick Evaluation

## 2019-09-27 ENCOUNTER — Other Ambulatory Visit: Payer: Self-pay

## 2019-09-27 ENCOUNTER — Ambulatory Visit (HOSPITAL_BASED_OUTPATIENT_CLINIC_OR_DEPARTMENT_OTHER): Payer: BC Managed Care – PPO | Admitting: Anesthesiology

## 2019-09-27 ENCOUNTER — Ambulatory Visit (HOSPITAL_BASED_OUTPATIENT_CLINIC_OR_DEPARTMENT_OTHER)
Admission: RE | Admit: 2019-09-27 | Discharge: 2019-09-27 | Disposition: A | Discharge status: Home / Self Care | Payer: BC Managed Care – PPO | Attending: Obstetrics & Gynecology | Admitting: Obstetrics & Gynecology

## 2019-09-27 ENCOUNTER — Encounter (HOSPITAL_BASED_OUTPATIENT_CLINIC_OR_DEPARTMENT_OTHER)
Admission: RE | Disposition: A | Discharge status: Home / Self Care | Payer: Self-pay | Source: Home / Self Care | Attending: Obstetrics & Gynecology

## 2019-09-27 ENCOUNTER — Encounter (HOSPITAL_BASED_OUTPATIENT_CLINIC_OR_DEPARTMENT_OTHER): Payer: Self-pay | Admitting: Obstetrics & Gynecology

## 2019-09-27 DIAGNOSIS — Z79899 Other long term (current) drug therapy: Secondary | ICD-10-CM | POA: Insufficient documentation

## 2019-09-27 DIAGNOSIS — Z7984 Long term (current) use of oral hypoglycemic drugs: Secondary | ICD-10-CM | POA: Insufficient documentation

## 2019-09-27 DIAGNOSIS — N8500 Endometrial hyperplasia, unspecified: Secondary | ICD-10-CM | POA: Diagnosis not present

## 2019-09-27 DIAGNOSIS — N840 Polyp of corpus uteri: Secondary | ICD-10-CM | POA: Insufficient documentation

## 2019-09-27 DIAGNOSIS — E119 Type 2 diabetes mellitus without complications: Secondary | ICD-10-CM | POA: Insufficient documentation

## 2019-09-27 DIAGNOSIS — N950 Postmenopausal bleeding: Secondary | ICD-10-CM | POA: Insufficient documentation

## 2019-09-27 DIAGNOSIS — I10 Essential (primary) hypertension: Secondary | ICD-10-CM | POA: Diagnosis not present

## 2019-09-27 HISTORY — DX: Headache, unspecified: R51.9

## 2019-09-27 HISTORY — DX: Postmenopausal bleeding: N95.0

## 2019-09-27 HISTORY — PX: DILATATION & CURETTAGE/HYSTEROSCOPY WITH MYOSURE: SHX6511

## 2019-09-27 LAB — GLUCOSE, CAPILLARY
Glucose-Capillary: 133 mg/dL — ABNORMAL HIGH (ref 70–99)
Glucose-Capillary: 115 mg/dL — ABNORMAL HIGH (ref 70–99)

## 2019-09-27 SURGERY — DILATATION & CURETTAGE/HYSTEROSCOPY WITH MYOSURE
Anesthesia: General | Site: Vagina

## 2019-09-27 MED ORDER — LIDOCAINE-EPINEPHRINE 1 %-1:100000 IJ SOLN
INTRAMUSCULAR | Status: DC | PRN
  Administered 2019-09-27: 10 mL

## 2019-09-27 MED ORDER — KETOROLAC TROMETHAMINE 30 MG/ML IJ SOLN: 30.0000 mg | Freq: Once | INTRAMUSCULAR | Status: DC | PRN

## 2019-09-27 MED ORDER — OXYCODONE HCL 5 MG/5ML PO SOLN: 5.0000 mg | Freq: Once | ORAL | Status: DC | PRN

## 2019-09-27 MED ORDER — KETOROLAC TROMETHAMINE 30 MG/ML IJ SOLN
INTRAMUSCULAR | Status: DC | PRN
  Administered 2019-09-27: 30 mg via INTRAVENOUS

## 2019-09-27 MED ORDER — LACTATED RINGERS IV SOLN
INTRAVENOUS | Status: DC
  Administered 2019-09-27: 08:00:00 50 mL/h via INTRAVENOUS

## 2019-09-27 MED ORDER — LIDOCAINE HCL (CARDIAC) PF 100 MG/5ML IV SOSY
PREFILLED_SYRINGE | INTRAVENOUS | Status: DC | PRN
  Administered 2019-09-27: 100 mg via INTRAVENOUS

## 2019-09-27 MED ORDER — KETOROLAC TROMETHAMINE 30 MG/ML IJ SOLN
INTRAMUSCULAR | Status: AC
  Filled 2019-09-27: qty 1

## 2019-09-27 MED ORDER — HYDROCODONE-ACETAMINOPHEN 5-325 MG PO TABS: 1.0000 | ORAL_TABLET | Freq: Four times a day (QID) | ORAL | 0 refills | Status: DC | PRN

## 2019-09-27 MED ORDER — LIDOCAINE 2% (20 MG/ML) 5 ML SYRINGE
INTRAMUSCULAR | Status: AC
  Filled 2019-09-27: qty 5

## 2019-09-27 MED ORDER — PROPOFOL 10 MG/ML IV BOLUS
INTRAVENOUS | Status: AC
  Filled 2019-09-27: qty 20

## 2019-09-27 MED ORDER — MIDAZOLAM HCL 2 MG/2ML IJ SOLN
INTRAMUSCULAR | Status: AC
  Filled 2019-09-27: qty 2

## 2019-09-27 MED ORDER — DEXAMETHASONE SODIUM PHOSPHATE 4 MG/ML IJ SOLN
INTRAMUSCULAR | Status: DC | PRN
  Administered 2019-09-27: 5 mg via INTRAVENOUS

## 2019-09-27 MED ORDER — ONDANSETRON HCL 4 MG/2ML IJ SOLN: 4.0000 mg | Freq: Once | INTRAMUSCULAR | Status: DC | PRN

## 2019-09-27 MED ORDER — HYDROMORPHONE HCL 1 MG/ML IJ SOLN: 0.2500 mg | INTRAMUSCULAR | Status: DC | PRN

## 2019-09-27 MED ORDER — OXYCODONE HCL 5 MG PO TABS: 5.0000 mg | ORAL_TABLET | Freq: Once | ORAL | Status: DC | PRN

## 2019-09-27 MED ORDER — FENTANYL CITRATE (PF) 100 MCG/2ML IJ SOLN
INTRAMUSCULAR | Status: DC | PRN
  Administered 2019-09-27: 50 ug via INTRAVENOUS
  Administered 2019-09-27 (×2): 25 ug via INTRAVENOUS

## 2019-09-27 MED ORDER — FENTANYL CITRATE (PF) 100 MCG/2ML IJ SOLN
INTRAMUSCULAR | Status: AC
  Filled 2019-09-27: qty 2

## 2019-09-27 MED ORDER — DEXAMETHASONE SODIUM PHOSPHATE 10 MG/ML IJ SOLN
INTRAMUSCULAR | Status: AC
  Filled 2019-09-27: qty 1

## 2019-09-27 MED ORDER — IBUPROFEN 800 MG PO TABS
800.0000 mg | ORAL_TABLET | Freq: Three times a day (TID) | ORAL | 0 refills | Status: DC | PRN
Start: 2019-09-27 — End: 2019-10-11

## 2019-09-27 MED ORDER — MIDAZOLAM HCL 5 MG/5ML IJ SOLN
INTRAMUSCULAR | Status: DC | PRN
  Administered 2019-09-27: 2 mg via INTRAVENOUS

## 2019-09-27 MED ORDER — PROPOFOL 10 MG/ML IV BOLUS
INTRAVENOUS | Status: DC | PRN
  Administered 2019-09-27: 200 mg via INTRAVENOUS

## 2019-09-27 MED ORDER — ONDANSETRON HCL 4 MG/2ML IJ SOLN
INTRAMUSCULAR | Status: DC | PRN
  Administered 2019-09-27: 4 mg via INTRAVENOUS

## 2019-09-27 SURGICAL SUPPLY — 24 items
BIPOLAR CUTTING LOOP 21FR (ELECTRODE)
CANISTER SUCT 3000ML PPV (MISCELLANEOUS) IMPLANT
CATH ROBINSON RED A/P 16FR (CATHETERS) ×3 IMPLANT
COVER WAND RF STERILE (DRAPES) ×3 IMPLANT
DEVICE MYOSURE LITE (MISCELLANEOUS) ×2 IMPLANT
DEVICE MYOSURE REACH (MISCELLANEOUS) IMPLANT
DILATOR CANAL MILEX (MISCELLANEOUS) ×2 IMPLANT
GAUZE 4X4 16PLY RFD (DISPOSABLE) ×3 IMPLANT
GLOVE BIOGEL PI IND STRL 7.0 (GLOVE) ×1 IMPLANT
GLOVE BIOGEL PI INDICATOR 7.0 (GLOVE) ×2
GLOVE ECLIPSE 6.5 STRL STRAW (GLOVE) ×6 IMPLANT
GOWN STRL REUS W/ TWL LRG LVL3 (GOWN DISPOSABLE) ×1 IMPLANT
GOWN STRL REUS W/ TWL XL LVL3 (GOWN DISPOSABLE) ×1 IMPLANT
GOWN STRL REUS W/TWL LRG LVL3 (GOWN DISPOSABLE) ×3
GOWN STRL REUS W/TWL XL LVL3 (GOWN DISPOSABLE) ×3
IV NS IRRIG 3000ML ARTHROMATIC (IV SOLUTION) ×3 IMPLANT
KIT PROCEDURE FLUENT (KITS) ×3 IMPLANT
KIT TURNOVER CYSTO (KITS) ×3 IMPLANT
LOOP CUTTING BIPOLAR 21FR (ELECTRODE) IMPLANT
PACK VAGINAL MINOR WOMEN LF (CUSTOM PROCEDURE TRAY) ×3 IMPLANT
PAD OB MATERNITY 4.3X12.25 (PERSONAL CARE ITEMS) ×3 IMPLANT
SEAL CERVICAL OMNI LOK (ABLATOR) IMPLANT
SEAL ROD LENS SCOPE MYOSURE (ABLATOR) ×3 IMPLANT
WATER STERILE IRR 500ML POUR (IV SOLUTION) ×3 IMPLANT

## 2019-09-27 NOTE — Op Note (Addendum)
09/27/2019  10:10 AM  PATIENT:  Holly Hendrix  59 y.o. female  PRE-OPERATIVE DIAGNOSIS:  Postmenopausal bleeding, thickened endometrium, Intramural endometrial polyp  POST-OPERATIVE DIAGNOSIS:  Postmenopausal bleeding, thickened endometrium, Intramural endometrial polyp  PROCEDURE:  Procedure(s): DILATATION & CURETTAGE/HYSTEROSCOPY WITH MYOSURE  SURGEON:  Megan Salon  ASSISTANTS: OR staff   ANESTHESIA:  LMA  ESTIMATED BLOOD LOSS: 5 mL  BLOOD ADMINISTERED:none   FLUIDS: 900cc LR  UOP: 50cc clear UOP drained at beginning of procedure  SPECIMEN:  Endometrial polyps and endometrial curetting   DISPOSITION OF SPECIMEN:  PATHOLOGY  FINDINGS: two endometrial polyps, one attachment anteriorly and the second on the inferior right side of the endometrial cavity.  DESCRIPTION OF OPERATION: Patient was taken to the operating room.  She is placed in the supine position. SCDs were on her lower extremities and functioning properly. General anesthesia with an LMA was administered without difficulty. Dr. Edwinna Areola, anesthesia, oversaw case.  Legs were then placed in the [place] in the low lithotomy position. The legs were lifted to the high lithotomy position and the Betadine prep was used on the inner thighs perineum and vagina x3. Patient was draped in a normal standard fashion. An in and out catheterization with a red rubber Foley catheter was performed. Approximately 50 cc of clear urine was noted. A bivalve speculum was placed the vagina. The anterior lip of the cervix was grasped with single-tooth tenaculum.  A paracervical block of 1% lidocaine mixed one-to-one with epinephrine (1:100,000 units).  10 cc was used total. The cervix is dilated up to #21 [facility] dilators. The endometrial cavity sounded to 10 cm.   A Myosure hysteroscope was obtained. Normal saline was used as a hysteroscopic fluid. The hysteroscope was advanced through the endocervical canal into the endometrial cavity. The  tubal ostia were noted bilaterally. Additional findings included two polyps as described above.  Using the [facility] lite device, the polyps were fully resected.  The hysteroscope was removed. A #1 toothed curette was used to curette the cavity until rough gritty texture was noted in all quadrants. With revisualization of the hysteroscope, there was no longer any abnormal findings.  At this point no other procedure was needed and this procedure was ended. Photodocumentation was obtained.  The hysteroscope was removed. The fluid deficit was 65 cc. The tenaculum was removed from the anterior lip of the cervix. The speculum was removed from the vagina. The prep was cleansed of the patient's skin. The legs are positioned back in the supine position. Sponge, lap, needle, initially counts were correct x2. Patient was taken to recovery in stable condition.  COUNTS:  YES  PLAN OF CARE: Transfer to PACU

## 2019-09-27 NOTE — Anesthesia Postprocedure Evaluation (Signed)
Anesthesia Post Note  Patient: Suzet Riedlinger  Procedure(s) Performed: DILATATION & CURETTAGE/HYSTEROSCOPY WITH MYOSURE (N/A Vagina )     Patient location during evaluation: PACU Anesthesia Type: General Level of consciousness: awake and alert Pain management: pain level controlled Vital Signs Assessment: post-procedure vital signs reviewed and stable Respiratory status: spontaneous breathing, nonlabored ventilation, respiratory function stable and patient connected to nasal cannula oxygen Cardiovascular status: blood pressure returned to baseline and stable Postop Assessment: no apparent nausea or vomiting Anesthetic complications: no    Last Vitals:  Vitals:   09/27/19 1115 09/27/19 1145  BP: (!) 141/95 (!) 155/98  Pulse: 73   Resp: 16 20  Temp:  36.6 C  SpO2: 95% 96%    Last Pain:  Vitals:   09/27/19 1145  TempSrc:   PainSc: 0-No pain                 Barnet Glasgow

## 2019-09-27 NOTE — Discharge Instructions (Addendum)
Post-surgical Instructions, Outpatient Surgery  You may expect to feel dizzy, weak, and drowsy for as long as 24 hours after receiving the medicine that made you sleep (anesthetic). For the first 24 hours after your surgery:    Do not drive a car, ride a bicycle, participate in physical activities, or take public transportation until you are done taking narcotic pain medicines or as directed by Dr. Sabra Heck.   Do not drink alcohol or take tranquilizers.   Do not take medicine that has not been prescribed by your physicians.   Do not sign important papers or make important decisions while on narcotic pain medicines.   Have a responsible person with you.   PAIN MANAGEMENT  Motrin 800mg .  (This is the same as 4-200mg  over the counter tablets of Motrin or ibuprofen.)  You may take this every eight hours or as needed for cramping.    Vicodin 5/325mg .  For more severe pain, take one every four to six hours as needed for pain control.  (Remember that narcotic pain medications increase your risk of constipation.  If this becomes a problem, you may take an over the counter stool softener like Colace 100mg  up to four times a day.)  DO'S AND DON'T'S  Do not take a tub bath for one week.  You may shower on the first day after your surgery  Do not do any heavy lifting for one to two weeks.  This increases the chance of bleeding.  Do move around as you feel able.  Stairs are fine.  You may begin to exercise again as you feel able.  Do not lift any weights for two weeks.  Do not put anything in the vagina for two weeks--no tampons, intercourse, or douching.    REGULAR MEDIATIONS/VITAMINS:  You may restart all of your regular medications as prescribed.  You may restart all of your vitamins as you normally take them.    PLEASE CALL OR SEEK MEDICAL CARE IF:  You have persistent nausea and vomiting.   You have trouble eating or drinking.   You have an oral temperature above 100.5.   You have  constipation that is not helped by adjusting diet or increasing fluid intake. Pain medicines are a common cause of constipation.   You have heavy vaginal bleeding   Post Anesthesia Home Care Instructions  Activity: Get plenty of rest for the remainder of the day. A responsible adult should stay with you for 24 hours following the procedure.  For the next 24 hours, DO NOT: -Drive a car -Paediatric nurse -Drink alcoholic beverages -Take any medication unless instructed by your physician -Make any legal decisions or sign important papers.  Meals: Start with liquid foods such as gelatin or soup. Progress to regular foods as tolerated. Avoid greasy, spicy, heavy foods. If nausea and/or vomiting occur, drink only clear liquids until the nausea and/or vomiting subsides. Call your physician if vomiting continues.  Special Instructions/Symptoms: Your throat may feel dry or sore from the anesthesia or the breathing tube placed in your throat during surgery. If this causes discomfort, gargle with warm salt water. The discomfort should disappear within 24 hours.  If you had a scopolamine patch placed behind your ear for the management of post- operative nausea and/or vomiting:  1. The medication in the patch is effective for 72 hours, after which it should be removed.  Wrap patch in a tissue and discard in the trash. Wash hands thoroughly with soap and water. 2. You may  remove the patch earlier than 72 hours if you experience unpleasant side effects which may include dry mouth, dizziness or visual disturbances. 3. Avoid touching the patch. Wash your hands with soap and water after contact with the patch.

## 2019-09-27 NOTE — Transfer of Care (Signed)
Immediate Anesthesia Transfer of Care Note  Patient: Holly Hendrix  Procedure(s) Performed: Procedure(s) (LRB): DILATATION & CURETTAGE/HYSTEROSCOPY WITH MYOSURE (N/A)  Patient Location: PACU  Anesthesia Type: General  Level of Consciousness: awake, sedated, patient cooperative and responds to stimulation  Airway & Oxygen Therapy: Patient Spontanous Breathing and Patient connected to [place] 02 and soft FM  Post-op Assessment: Report given to PACU RN, Post -op Vital signs reviewed and stable and Patient moving all extremities  Post vital signs: Reviewed and stable  Complications: No apparent anesthesia complications

## 2019-09-27 NOTE — H&P (Signed)
Holly Hendrix is an 59 y.o. female G42P2 D AAF here for hysteroscopy with probably polyp resection and D&C due to postmenopausal bleeding.  She was evaluated in September for this.  Ultrasound showed a 15mm endometrial thickness with cystic spaces.  Endometrial biopsy showed infarcted polyp.  Hysteroscopy was recommended then.  Pt did not want to proceed at that time.  She recently called our office and decided to proceed with surgery.  She was seen on 09/16/2019 and procedure, risks, benefits, alternatives were discussed.  Pt aware there really is no alternative.  She is now here and ready to proceed.  Pertinent Gynecological History: Menses: post-menopausal Bleeding: post menopausal bleeding Contraception: post menopausal status DES exposure: denies Blood transfusions: none Sexually transmitted diseases: no past history Previous GYN Procedures: none  Last mammogram: normal Date: 11/18/2018 Last pap: normal Date: 01/05/2019 neg pap with neg HR HPV  OB History: G4, P2   Menstrual History: No LMP recorded. Patient is postmenopausal.    Past Medical History:  Diagnosis Date  . Diabetes mellitus type 2, controlled, without complications ([place])   . Elevated LDL cholesterol level   . Headache   . History of low potassium   . Hypertension   . PMB (postmenopausal bleeding)     Past Surgical History:  Procedure Laterality Date  . INDUCED ABORTION    . TUBAL LIGATION      Family History  Problem Relation Age of Onset  . Arthritis Sister   . Diabetes Sister   . Hypertension Sister   . Cancer Father        unknown   . Arthritis Brother   . Ulcers Brother   . Diabetes Sister   . Hypertension Sister   . Arthritis Sister   . Colon cancer Neg Hx     Social History:  reports that she has never smoked. She has never used smokeless tobacco. She reports that she does not drink alcohol or use drugs.  Allergies: No Known Allergies  Medications Prior to Admission  Medication Sig Dispense  Refill Last Dose  . atorvastatin (LIPITOR) 20 MG tablet Take 1 tablet (20 mg total) by mouth daily. (Patient taking differently: Take 20 mg by mouth every evening. ) 90 tablet 1 09/26/2019 at Unknown time  . lisinopril-hydrochlorothiazide (ZESTORETIC) 20-25 MG tablet Take 1 tablet by mouth once daily 90 tablet 0 09/26/2019 at Unknown time  . metFORMIN (GLUCOPHAGE) 500 MG tablet Take 1 tablet by mouth once daily with breakfast (Patient taking differently: 500 mg daily with breakfast. ) 30 tablet 2 09/26/2019 at Unknown time  . potassium chloride SA (KLOR-CON) 20 MEQ tablet TAKE 1  BY MOUTH ONCE DAILY 90 tablet 0 Past Week at Unknown time  . Acetaminophen (TYLENOL PO) Take 500 mg by mouth. Takes 2 tabs       Review of Systems  All other systems reviewed and are negative.   Blood pressure (!) 138/102, pulse 85, temperature 98.4 F (36.9 C), temperature source Oral, resp. rate 20, height 5' 3.75" (1.619 m), weight 98.9 kg, SpO2 98 %. Physical Exam  Constitutional: She is oriented to person, place, and time. She appears well-developed and well-nourished.  Cardiovascular: Normal rate and regular rhythm.  Respiratory: Effort normal and breath sounds normal.  Neurological: She is alert and oriented to person, place, and time.  Skin: Skin is warm and dry.  Psychiatric: She has a normal mood and affect.    Results for orders placed or performed during the hospital encounter of 09/27/19 (  from the past 24 hour(s))  Glucose, capillary     Status: Abnormal   Collection Time: 09/27/19  7:38 AM  Result Value Ref Range   Glucose-Capillary 133 (H) 70 - 99 mg/dL    No results found.  Assessment/Plan: 59 yo [place] with PMP bleeding, 62mm endometrium with cystic spaces, endometrial biopsy showing infarcted polyp and delay in care due to pt's choice here for hysteroscopy with polyp resection, D&C.  Questions answered.  Pt ready to proceed.    Megan Salon 09/27/2019, 9:22 AM

## 2019-09-27 NOTE — Anesthesia Procedure Notes (Addendum)
Procedure Name: LMA Insertion Date/Time: 09/27/2019 9:49 AM Performed by: Justice Rocher, CRNA Pre-anesthesia Checklist: Patient identified, Emergency Drugs available, Suction available, Patient being monitored and Timeout performed Patient Re-evaluated:Patient Re-evaluated prior to induction Oxygen Delivery Method: Circle system utilized Preoxygenation: Pre-oxygenation with 100% oxygen Induction Type: IV induction Ventilation: Mask ventilation without difficulty LMA: LMA inserted LMA Size: 4.0 Number of attempts: 1 Airway Equipment and Method: Bite block Placement Confirmation: positive ETCO2,  breath sounds checked- equal and bilateral and CO2 detector Tube secured with: Tape Dental Injury: Teeth and Oropharynx as per pre-operative assessment

## 2019-09-28 LAB — SURGICAL PATHOLOGY

## 2019-09-28 NOTE — Progress Notes (Signed)
GYNECOLOGY  VISIT  CC:   Recheck after surgery  HPI: 59 y.o. [id number] Divorced Black or [place] American female here for recheck after hysteroscopic polyp resection.  Pathology, surgical findings reviewed.  Pt had spotting for a few days.  Took very little pain medication.  Now is back at work and feels fine.  No fever.  No discharge.  Last pap was 01/05/2019, negative with negative HR HPV.  GYNECOLOGIC HISTORY: No LMP recorded. Patient is postmenopausal. Contraception: PMP Menopausal hormone therapy: n/a  Patient Active Problem List   Diagnosis Date Noted  . Lymphocytosis 04/07/2019  . Intramural uterine fibroid 02/14/2019  . Chronic foot pain, left 03/16/2018  . Enlarged thyroid 08/27/2017  . Estrogen deficiency 08/27/2017  . Diabetes type 2, controlled ([place]) 06/13/2016  . Obesity 06/13/2016  . Dyslipidemia 07/29/2013  . DENTAL CARIES 10/05/2009  . INCONTINENCE, URGE 08/02/2009  . HYPERTENSION, BENIGN ESSENTIAL 06/06/2008  . ALLERGIC RHINITIS 06/06/2008    Past Medical History:  Diagnosis Date  . Diabetes mellitus type 2, controlled, without complications ([place])   . Elevated LDL cholesterol level   . Headache   . History of low potassium   . Hypertension   . PMB (postmenopausal bleeding)     Past Surgical History:  Procedure Laterality Date  . DILATATION & CURETTAGE/HYSTEROSCOPY WITH MYOSURE N/A 09/27/2019   Procedure: DILATATION & CURETTAGE/HYSTEROSCOPY WITH MYOSURE;  Surgeon: Megan Salon, MD;  Location: [facility];  Service: Gynecology;  Laterality: N/A;  to follow first case of AM  . INDUCED ABORTION    . TUBAL LIGATION      MEDS:   Current Outpatient Medications on File Prior to Visit  Medication Sig Dispense Refill  . Acetaminophen (TYLENOL PO) Take 500 mg by mouth. Takes 2 tabs    . atorvastatin (LIPITOR) 20 MG tablet Take 1 tablet (20 mg total) by mouth daily. (Patient taking differently: Take 20 mg by mouth every evening. ) 90 tablet 1  .  lisinopril-hydrochlorothiazide (ZESTORETIC) 20-25 MG tablet Take 1 tablet by mouth once daily 90 tablet 0  . metFORMIN (GLUCOPHAGE) 500 MG tablet Take 1 tablet by mouth once daily with breakfast 30 tablet 1  . potassium chloride SA (KLOR-CON) 20 MEQ tablet TAKE 1  BY MOUTH ONCE DAILY (Patient not taking: Reported on 10/11/2019) 90 tablet 0   No current facility-administered medications on file prior to visit.    ALLERGIES: Patient has no known allergies.  Family History  Problem Relation Age of Onset  . Arthritis Sister   . Diabetes Sister   . Hypertension Sister   . Cancer Father        unknown   . Arthritis Brother   . Ulcers Brother   . Diabetes Sister   . Hypertension Sister   . Arthritis Sister   . Colon cancer Neg Hx     SH:  Divorced, non smoker  Review of Systems  PHYSICAL EXAMINATION:    BP 110/72   Pulse 70   Temp (!) 97.2 F (36.2 C) (Skin)   Resp 16   Wt 217 lb (98.4 kg)   BMI 37.54 kg/m     General appearance: alert, cooperative and appears stated age Lymph:  no inguinal LAD noted  Pelvic: External genitalia:  no lesions              Urethra:  normal appearing urethra with no masses, tenderness or lesions              Bartholins  and Skenes: normal                 Vagina: normal appearing vagina with normal color and discharge, no lesions              Cervix: no lesions              Bimanual Exam:  Uterus:  normal size, contour, position, consistency, mobility, non-tender              Adnexa: no mass, fullness, tenderness               Chaperone, Terence Lux, CMA, was present for exam.  Assessment: S/p hysteroscopic polyp resection, doing well  Plan: Pt knows to call with any further vaginal bleeding. Pt is welcome to follow up in the future if needed or with any new gyn problems.

## 2019-10-01 ENCOUNTER — Telehealth: Payer: Self-pay | Admitting: Obstetrics & Gynecology

## 2019-10-01 NOTE — Telephone Encounter (Signed)
Letter reviewed and signed by Dr. Sabra Heck.   Call returned to patient. Patient states she will pick letter up from office. Letter placed at front office for her to pick up, patient is aware of office hours. Patient verbalizes understanding and is agreeable.   Encounter closed.

## 2019-10-01 NOTE — Telephone Encounter (Signed)
Patient says she need a work note to return on Monday.

## 2019-10-01 NOTE — Telephone Encounter (Signed)
Spoke with patient. Patient is s/p D&C on 09/27/19. Patient reports she is doing well, scant amount of spotting, denies pain. Patient will see Dr. Sabra Heck for 2 wk f/u on 10/11/19. Patient is requesting a return to work letter for 10/04/19. Patient will notify RN upon return call where she would like letter faxed. Patient agreeable.    Dr. Assunta Curtis review pended letter.

## 2019-10-08 ENCOUNTER — Other Ambulatory Visit: Payer: Self-pay | Admitting: Family Medicine

## 2019-10-08 DIAGNOSIS — E119 Type 2 diabetes mellitus without complications: Secondary | ICD-10-CM

## 2019-10-11 ENCOUNTER — Ambulatory Visit (INDEPENDENT_AMBULATORY_CARE_PROVIDER_SITE_OTHER): Payer: BC Managed Care – PPO | Admitting: Obstetrics & Gynecology

## 2019-10-11 ENCOUNTER — Encounter: Payer: Self-pay | Admitting: Obstetrics & Gynecology

## 2019-10-11 ENCOUNTER — Other Ambulatory Visit: Payer: Self-pay

## 2019-10-11 VITALS — BP 110/72 | HR 70 | Temp 97.2°F | Resp 16 | Wt 217.0 lb

## 2019-10-11 DIAGNOSIS — N840 Polyp of corpus uteri: Secondary | ICD-10-CM

## 2019-11-29 ENCOUNTER — Other Ambulatory Visit: Payer: Self-pay | Admitting: Family Medicine

## 2019-11-30 NOTE — Telephone Encounter (Signed)
Pt has an upcoming appt in august for follow-up on bp

## 2019-12-02 ENCOUNTER — Other Ambulatory Visit: Payer: Self-pay | Admitting: Family Medicine

## 2019-12-02 DIAGNOSIS — Z1231 Encounter for screening mammogram for malignant neoplasm of breast: Secondary | ICD-10-CM

## 2019-12-06 ENCOUNTER — Other Ambulatory Visit: Payer: Self-pay | Admitting: Family Medicine

## 2019-12-06 DIAGNOSIS — E119 Type 2 diabetes mellitus without complications: Secondary | ICD-10-CM

## 2019-12-14 ENCOUNTER — Other Ambulatory Visit: Payer: Self-pay

## 2019-12-14 ENCOUNTER — Ambulatory Visit
Admission: RE | Admit: 2019-12-14 | Discharge: 2019-12-14 | Disposition: A | Discharge status: Home / Self Care | Payer: BC Managed Care – PPO | Source: Ambulatory Visit | Attending: Family Medicine | Admitting: Family Medicine

## 2019-12-14 DIAGNOSIS — Z1231 Encounter for screening mammogram for malignant neoplasm of breast: Secondary | ICD-10-CM

## 2019-12-23 ENCOUNTER — Ambulatory Visit: Payer: BC Managed Care – PPO | Admitting: Family Medicine

## 2020-01-06 ENCOUNTER — Other Ambulatory Visit: Payer: Self-pay | Admitting: Family Medicine

## 2020-01-06 DIAGNOSIS — E119 Type 2 diabetes mellitus without complications: Secondary | ICD-10-CM

## 2020-01-06 NOTE — Telephone Encounter (Signed)
Pt scheduled an appt on 9/1

## 2020-01-18 NOTE — Progress Notes (Addendum)
  Subjective:    Patient ID: Holly Hendrix, female    DOB: Apr 18, 1961, 59 y.o.   MRN: [id number]  Holly Hendrix is a 59 y.o. female who presents for follow-up of Type 2 diabetes mellitus.   HTN- taking mediation without any issues. Does not check BP at home Taking statin daily   Patient is not checking home blood sugars.   Home blood sugar records: patient does not check sugars How often is blood sugars being checked: doesn't check Current symptoms include: none. Patient denies increased appetite, nausea, visual disturbances, vomiting and weight loss.  Patient is checking their feet daily. Any Foot concerns (callous, ulcer, wound, thickened nails, toenail fungus, skin fungus, hammer toe): none Last dilated eye exam:  [facility] on [address]   Current treatments: metformin  Medication compliance: good  Current diet: in general, a "healthy" diet   Current exercise: walking Known diabetic complications: none  The following portions of the patient's history were reviewed and updated as appropriate: allergies, current medications, past medical history, past social history and problem list.  ROS as in subjective above.     Objective:    Physical Exam Alert and in no distress otherwise not examined.  Blood pressure 120/70, pulse (!) 101, weight 212 lb 3.2 oz (96.3 kg).  Lab Review Diabetic Labs Latest Ref Rng & Units 01/19/2020 09/23/2019 04/07/2019 01/13/2019 01/05/2019  HbA1c 4.0 - 5.6 % 6.8(A) - 6.6(A) - 7.7(A)  Microalbumin Not estab mg/dL - - - - -  Micro/Creat Ratio 0 - 29 mg/g creat - - - - -  Chol 100 - 199 mg/dL - - - - 143  HDL >39 mg/dL - - - - 51  Calc LDL 0 - 99 mg/dL - - - - 74  Triglycerides 0 - 149 mg/dL - - - - 89  Creatinine 0.44 - 1.00 mg/dL - 0.85 0.67 1.00 0.78   BP/Weight 01/19/2020 10/11/2019 09/27/2019 09/23/2019 1/50/5697  Systolic BP 948 016 553 748 270  Diastolic BP 70 72 98 89 94  Wt. (Lbs) 212.2 217 218 217 220  BMI 36.71 37.54 37.71 37.54 38.66    Foot/eye exam completion dates 07/06/2018 01/09/2017  Foot Form Completion Done Done    Holly Hendrix  reports that she has never smoked. She has never used smokeless tobacco. She reports that she does not drink alcohol and does not use drugs.     Assessment & Plan:    Controlled type 2 diabetes mellitus without complication, without long-term current use of insulin ([place]) - Plan: HgB A1c, CBC with Differential/Platelet, Comprehensive metabolic panel, Lipid panel  HYPERTENSION, BENIGN ESSENTIAL - Plan: CBC with Differential/Platelet, Comprehensive metabolic panel  1. Rx changes: none Hgb A1c 6.8%  2. Education: Reviewed 'ABCs' of diabetes management (respective goals in parentheses):  A1C (<7), blood pressure (<130/80), and cholesterol (LDL <100). 3. Compliance at present is estimated to be good. Efforts to improve compliance (if necessary) will be directed at dietary modifications: limit sugar and carbohydrates, increased exercise and regular blood sugar monitoring: does not check her BS. 4. Continue HTN medications 5. Continue statin  6. Follow up: 6 months

## 2020-01-19 ENCOUNTER — Other Ambulatory Visit: Payer: Self-pay

## 2020-01-19 ENCOUNTER — Encounter: Payer: Self-pay | Admitting: Family Medicine

## 2020-01-19 ENCOUNTER — Ambulatory Visit: Payer: BC Managed Care – PPO | Admitting: Family Medicine

## 2020-01-19 VITALS — BP 120/70 | HR 101 | Wt 212.2 lb

## 2020-01-19 DIAGNOSIS — I10 Essential (primary) hypertension: Secondary | ICD-10-CM

## 2020-01-19 DIAGNOSIS — E119 Type 2 diabetes mellitus without complications: Secondary | ICD-10-CM | POA: Diagnosis not present

## 2020-01-19 LAB — POCT GLYCOSYLATED HEMOGLOBIN (HGB A1C): Hemoglobin A1C: 6.8 % — AB (ref 4.0–5.6)

## 2020-01-19 NOTE — Patient Instructions (Signed)
Continue your medications.   Limit sugar and bread, potatoes, pasta, rice, and other carbohydrates.   Exercise.   Follow up in 3 months for your annual physical.

## 2020-01-19 NOTE — Addendum Note (Signed)
Addended by: Girtha Rm on: 01/19/2020 03:06 PM   Modules accepted: Orders

## 2020-01-20 ENCOUNTER — Other Ambulatory Visit: Payer: Self-pay | Admitting: Internal Medicine

## 2020-01-20 DIAGNOSIS — E876 Hypokalemia: Secondary | ICD-10-CM

## 2020-01-20 LAB — CBC WITH DIFFERENTIAL/PLATELET
Basophils Absolute: 0.1 10*3/uL (ref 0.0–0.2)
Basos: 1 %
EOS (ABSOLUTE): 0.2 10*3/uL (ref 0.0–0.4)
Eos: 3 %
Hematocrit: 41.5 % (ref 34.0–46.6)
Hemoglobin: 13.6 g/dL (ref 11.1–15.9)
Immature Grans (Abs): 0 10*3/uL (ref 0.0–0.1)
Immature Granulocytes: 0 %
Lymphocytes Absolute: 4.1 10*3/uL — ABNORMAL HIGH (ref 0.7–3.1)
Lymphs: 46 %
MCH: 26.2 pg — ABNORMAL LOW (ref 26.6–33.0)
MCHC: 32.8 g/dL (ref 31.5–35.7)
MCV: 80 fL (ref 79–97)
Monocytes Absolute: 0.6 10*3/uL (ref 0.1–0.9)
Monocytes: 7 %
Neutrophils Absolute: 3.7 10*3/uL (ref 1.4–7.0)
Neutrophils: 43 %
Platelets: 265 10*3/uL (ref 150–450)
RBC: 5.19 x10E6/uL (ref 3.77–5.28)
RDW: 14.9 % (ref 11.7–15.4)
WBC: 8.7 10*3/uL (ref 3.4–10.8)

## 2020-01-20 LAB — COMPREHENSIVE METABOLIC PANEL
ALT: 16 IU/L (ref 0–32)
AST: 18 IU/L (ref 0–40)
Albumin/Globulin Ratio: 1.4 (ref 1.2–2.2)
Albumin: 4.1 g/dL (ref 3.8–4.8)
Alkaline Phosphatase: 82 IU/L (ref 48–121)
BUN/Creatinine Ratio: 28 (ref 12–28)
BUN: 25 mg/dL (ref 8–27)
Bilirubin Total: 0.3 mg/dL (ref 0.0–1.2)
CO2: 29 mmol/L (ref 20–29)
Calcium: 9.4 mg/dL (ref 8.7–10.3)
Chloride: 103 mmol/L (ref 96–106)
Creatinine, Ser: 0.89 mg/dL (ref 0.57–1.00)
GFR calc Af Amer: 81 mL/min/{1.73_m2} (ref >59)
GFR calc non Af Amer: 70 mL/min/{1.73_m2} (ref >59)
Globulin, Total: 3 g/dL (ref 1.5–4.5)
Glucose: 100 mg/dL — ABNORMAL HIGH (ref 65–99)
Potassium: 3.3 mmol/L — ABNORMAL LOW (ref 3.5–5.2)
Sodium: 145 mmol/L — ABNORMAL HIGH (ref 134–144)
Total Protein: 7.1 g/dL (ref 6.0–8.5)

## 2020-01-20 LAB — LIPID PANEL
Chol/HDL Ratio: 2.6 ratio (ref 0.0–4.4)
Cholesterol, Total: 102 mg/dL (ref 100–199)
HDL: 40 mg/dL (ref >39)
LDL Chol Calc (NIH): 44 mg/dL (ref 0–99)
Triglycerides: 89 mg/dL (ref 0–149)
VLDL Cholesterol Cal: 18 mg/dL (ref 5–40)

## 2020-01-20 NOTE — Progress Notes (Signed)
Is she taking her potassium supplement everyday? Her potassium is low so I do recommend that she take it daily. Let me know if she has not missed any doses and refill if she is is need of a refill. Thanks. Let's recheck a BMP for hypokalemia in 2 weeks.

## 2020-01-21 ENCOUNTER — Other Ambulatory Visit: Payer: Self-pay | Admitting: Family Medicine

## 2020-01-21 MED ORDER — POTASSIUM CHLORIDE CRYS ER 20 MEQ PO TBCR: EXTENDED_RELEASE_TABLET | ORAL | 0 refills | Status: DC

## 2020-01-24 ENCOUNTER — Other Ambulatory Visit: Payer: Self-pay | Admitting: Family Medicine

## 2020-01-24 DIAGNOSIS — E782 Mixed hyperlipidemia: Secondary | ICD-10-CM

## 2020-02-02 ENCOUNTER — Other Ambulatory Visit: Payer: Self-pay

## 2020-02-02 ENCOUNTER — Other Ambulatory Visit: Payer: BC Managed Care – PPO

## 2020-02-02 DIAGNOSIS — E876 Hypokalemia: Secondary | ICD-10-CM

## 2020-02-03 LAB — BASIC METABOLIC PANEL
BUN/Creatinine Ratio: 21 (ref 12–28)
BUN: 15 mg/dL (ref 8–27)
CO2: 25 mmol/L (ref 20–29)
Calcium: 9.7 mg/dL (ref 8.7–10.3)
Chloride: 104 mmol/L (ref 96–106)
Creatinine, Ser: 0.73 mg/dL (ref 0.57–1.00)
GFR calc Af Amer: 103 mL/min/{1.73_m2} (ref >59)
GFR calc non Af Amer: 89 mL/min/{1.73_m2} (ref >59)
Glucose: 96 mg/dL (ref 65–99)
Potassium: 3.9 mmol/L (ref 3.5–5.2)
Sodium: 144 mmol/L (ref 134–144)

## 2020-02-03 NOTE — Progress Notes (Signed)
Her potassium is in normal range now. Ok to keep taking the potassium supplement but let's ask her to cut them in half and take 10 MEQ daily now or we can send in a new prescription.

## 2020-02-07 ENCOUNTER — Other Ambulatory Visit: Payer: Self-pay | Admitting: Family Medicine

## 2020-02-07 DIAGNOSIS — E119 Type 2 diabetes mellitus without complications: Secondary | ICD-10-CM

## 2020-02-18 ENCOUNTER — Telehealth: Payer: Self-pay | Admitting: Family Medicine

## 2020-02-18 NOTE — Telephone Encounter (Signed)
Received requested records from [facility]

## 2020-03-04 ENCOUNTER — Other Ambulatory Visit: Payer: Self-pay | Admitting: Family Medicine

## 2020-05-01 ENCOUNTER — Other Ambulatory Visit: Payer: Self-pay | Admitting: Family Medicine

## 2020-05-01 DIAGNOSIS — E782 Mixed hyperlipidemia: Secondary | ICD-10-CM

## 2020-05-02 ENCOUNTER — Other Ambulatory Visit: Payer: Self-pay | Admitting: Family Medicine

## 2020-05-02 DIAGNOSIS — E782 Mixed hyperlipidemia: Secondary | ICD-10-CM

## 2020-05-15 ENCOUNTER — Ambulatory Visit: Payer: BC Managed Care – PPO | Admitting: Family Medicine

## 2020-05-15 ENCOUNTER — Other Ambulatory Visit: Payer: Self-pay

## 2020-05-15 ENCOUNTER — Encounter: Payer: Self-pay | Admitting: Family Medicine

## 2020-05-15 VITALS — BP 128/82 | HR 75 | Temp 97.8°F | Ht 64.25 in | Wt 209.2 lb

## 2020-05-15 DIAGNOSIS — Z23 Encounter for immunization: Secondary | ICD-10-CM

## 2020-05-15 DIAGNOSIS — E1169 Type 2 diabetes mellitus with other specified complication: Secondary | ICD-10-CM

## 2020-05-15 DIAGNOSIS — E119 Type 2 diabetes mellitus without complications: Secondary | ICD-10-CM

## 2020-05-15 DIAGNOSIS — E669 Obesity, unspecified: Secondary | ICD-10-CM | POA: Diagnosis not present

## 2020-05-15 DIAGNOSIS — I10 Essential (primary) hypertension: Secondary | ICD-10-CM

## 2020-05-15 DIAGNOSIS — E785 Hyperlipidemia, unspecified: Secondary | ICD-10-CM

## 2020-05-15 DIAGNOSIS — Z0000 Encounter for general adult medical examination without abnormal findings: Secondary | ICD-10-CM

## 2020-05-15 NOTE — Progress Notes (Signed)
Subjective:    Patient ID: Holly Hendrix, female    DOB: 11-01-1960, 59 y.o.   MRN: [id number]  HPI Chief Complaint  Patient presents with  . Annual Exam    CPE no other issues   She is new to the practice and here for a complete physical exam and to follow up on chronic health conditions.   Other providers: OB/GYN- Dr. Sabra Heck   DM- last A1c 6.8% on 01/19/2020.  Taking metformin daily. Diet poor lately.   HTN- reports taking medication daily without any concerns. Does not check BP at home. She is taking a potassium supplement.   HL- taking statin and no side effects.   Social history: Lives alone, has 2 sons, works at [employer] in [place]  Denies smoking, drinking alcohol, drug use  Diet: unhealthy recently. Drinking soda again but not as much. Drinks water  Excerise: walks at work but not outside of work   Immunizations: needs flu and pneumonia vaccines.   Health maintenance:  Mammogram: 12/14/2019 Colonoscopy: 10/2016 and due for a 10 yr recall in 2028 Last Gynecological Exam: May 2021  DEXA: normal 09/2017 Last Dental Exam: 1-2 years ago  Last Eye Exam: [facility]   Wears seatbelt always, smoke detectors in home and functioning, does not text while driving and feels safe in home environment.   Reviewed allergies, medications, past medical, surgical, family, and social history.     Review of Systems Review of Systems Constitutional: -fever, -chills, -sweats, -unexpected weight change,-fatigue ENT: -runny nose, -ear pain, -sore throat Cardiology:  -chest pain, -palpitations, -edema Respiratory: -cough, -shortness of breath, -wheezing Gastroenterology: -abdominal pain, -nausea, -vomiting, -diarrhea, -constipation  Hematology: -bleeding or bruising problems Musculoskeletal: -arthralgias, -myalgias, -joint swelling, -back pain Ophthalmology: -vision changes Urology: -dysuria, -difficulty urinating, -hematuria, -urinary frequency,  -urgency Neurology: -headache, -weakness, -tingling, -numbness       Objective:   Physical Exam BP 128/82   Pulse 75   Temp 97.8 F (36.6 C)   Ht 5' 4.25" (1.632 m)   Wt 209 lb 3.2 oz (94.9 kg)   LMP  (LMP Unknown)   BMI 35.63 kg/m   General Appearance:    Alert, cooperative, no distress, appears stated age  Head:    Normocephalic, without obvious abnormality, atraumatic  Eyes:    PERRL, conjunctiva/corneas clear, EOM's intact  Ears:    Normal TM's and external ear canals  Nose:   Mask on   Throat:   Mask on   Neck:   Supple, no lymphadenopathy;  thyroid:  no   enlargement/tenderness/nodules; no JVD  Back:    Spine nontender, no curvature, ROM normal, no CVA     tenderness  Lungs:     Clear to auscultation bilaterally without wheezes, rales or     ronchi; respirations unlabored  Chest Wall:    No tenderness or deformity   Heart:    Regular rate and rhythm, S1 and S2 normal, no murmur, rub   or gallop  Breast Exam:    OB/GYN   Abdomen:     Soft, non-tender, nondistended, normoactive bowel sounds,    no masses, no hepatosplenomegaly  Genitalia:    OB/GYN     Extremities:   No clubbing, cyanosis or edema  Pulses:   2+ and symmetric all extremities  Skin:   Skin color, texture, turgor normal, no rashes or lesions  Lymph nodes:   Cervical, supraclavicular, and axillary nodes normal  Neurologic:   CNII-XII intact, normal strength, sensation and  gait; reflexes 2+ and symmetric throughout          Psych:   Normal mood, affect, hygiene and grooming.        Assessment & Plan:  Routine general medical examination at a health care facility - Plan: Comprehensive metabolic panel, TSH, T4, free, T3, CBC with Differential/Platelet -preventive healthcare reviewed. She recently saw OB/GYN. DEXA UTD. Colonoscopy UTD. Counseling on healthy lifestyle including diet and exercise. Immunization counseling done.   Controlled type 2 diabetes mellitus without complication, without long-term  current use of insulin (HCC) - Plan: Comprehensive metabolic panel, TSH, T4, free, T3 -check Hgb A1c and other labs. Counseling on healthy diet and physical activity.  Continue Metformin. Eye exam done. Foot exam done.   HYPERTENSION, BENIGN ESSENTIAL - Plan: Comprehensive metabolic panel -BP controlled. continue medication. Check labs and follow up.   Hyperlipidemia associated with type 2 diabetes mellitus (HCC) - Plan: Lipid panel -check lipids and follow up. Continue statin.   Obesity (BMI 30-39.9) -recommend low carb diet and cutting back on portion sizes. Increase activity level   Needs flu shot - Plan: Flu Vaccine QUAD 36+ mos IM  Need for prophylactic vaccination against Streptococcus pneumoniae (pneumococcus) - Plan: Pneumococcal conjugate vaccine 13-valent -counseling on potential side effects.

## 2020-05-15 NOTE — Patient Instructions (Addendum)
Call and schedule a dental exams.   Cut back on soda, sugar and carbohydrates.   Try to get at least 150 minutes of physical activity each week.   I will be in touch with her results.     Preventive Care 78-61 Years Old, Female Preventive care refers to visits with your health care provider and lifestyle choices that can promote health and wellness. This includes:  A yearly physical exam. This may also be called an annual well check.  Regular dental visits and eye exams.  Immunizations.  Screening for certain conditions.  Healthy lifestyle choices, such as eating a healthy diet, getting regular exercise, not using drugs or products that contain nicotine and tobacco, and limiting alcohol use. What can I expect for my preventive care visit? Physical exam Your health care provider will check your:  Height and weight. This may be used to calculate body mass index (BMI), which tells if you are at a healthy weight.  Heart rate and blood pressure.  Skin for abnormal spots. Counseling Your health care provider may ask you questions about your:  Alcohol, tobacco, and drug use.  Emotional well-being.  Home and relationship well-being.  Sexual activity.  Eating habits.  Work and work Statistician.  Method of birth control.  Menstrual cycle.  Pregnancy history. What immunizations do I need?  Influenza (flu) vaccine  This is recommended every year. Tetanus, diphtheria, and pertussis (Tdap) vaccine  You may need a Td booster every 10 years. Varicella (chickenpox) vaccine  You may need this if you have not been vaccinated. Zoster (shingles) vaccine  You may need this after age 54. Measles, mumps, and rubella (MMR) vaccine  You may need at least one dose of MMR if you were born in 1957 or later. You may also need a second dose. Pneumococcal conjugate (PCV13) vaccine  You may need this if you have certain conditions and were not previously vaccinated. Pneumococcal  polysaccharide (PPSV23) vaccine  You may need one or two doses if you smoke cigarettes or if you have certain conditions. Meningococcal conjugate (MenACWY) vaccine  You may need this if you have certain conditions. Hepatitis A vaccine  You may need this if you have certain conditions or if you travel or work in places where you may be exposed to hepatitis A. Hepatitis B vaccine  You may need this if you have certain conditions or if you travel or work in places where you may be exposed to hepatitis B. Haemophilus influenzae type b (Hib) vaccine  You may need this if you have certain conditions. Human papillomavirus (HPV) vaccine  If recommended by your health care provider, you may need three doses over 6 months. You may receive vaccines as individual doses or as more than one vaccine together in one shot (combination vaccines). Talk with your health care provider about the risks and benefits of combination vaccines. What tests do I need? Blood tests  Lipid and cholesterol levels. These may be checked every 5 years, or more frequently if you are over 76 years old.  Hepatitis C test.  Hepatitis B test. Screening  Lung cancer screening. You may have this screening every year starting at age 35 if you have a 30-pack-year history of smoking and currently smoke or have quit within the past 15 years.  Colorectal cancer screening. All adults should have this screening starting at age 15 and continuing until age 29. Your health care provider may recommend screening at age 26 if you are at increased  risk. You will have tests every 1-10 years, depending on your results and the type of screening test.  Diabetes screening. This is done by checking your blood sugar (glucose) after you have not eaten for a while (fasting). You may have this done every 1-3 years.  Mammogram. This may be done every 1-2 years. Talk with your health care provider about when you should start having regular  mammograms. This may depend on whether you have a family history of breast cancer.  BRCA-related cancer screening. This may be done if you have a family history of breast, ovarian, tubal, or peritoneal cancers.  Pelvic exam and Pap test. This may be done every 3 years starting at age 41. Starting at age 73, this may be done every 5 years if you have a Pap test in combination with an HPV test. Other tests  Sexually transmitted disease (STD) testing.  Bone density scan. This is done to screen for osteoporosis. You may have this scan if you are at high risk for osteoporosis. Follow these instructions at home: Eating and drinking  Eat a diet that includes fresh fruits and vegetables, whole grains, lean protein, and low-fat dairy.  Take vitamin and mineral supplements as recommended by your health care provider.  Do not drink alcohol if: ? Your health care provider tells you not to drink. ? You are pregnant, may be pregnant, or are planning to become pregnant.  If you drink alcohol: ? Limit how much you have to 0-1 drink a day. ? Be aware of how much alcohol is in your drink. In the [place] one drink equals one 12 oz bottle of beer (355 mL), one 5 oz glass of wine (148 mL), or one 1 oz glass of hard liquor (44 mL). Lifestyle  Take daily care of your teeth and gums.  Stay active. Exercise for at least 30 minutes on 5 or more days each week.  Do not use any products that contain nicotine or tobacco, such as cigarettes, e-cigarettes, and chewing tobacco. If you need help quitting, ask your health care provider.  If you are sexually active, practice safe sex. Use a condom or other form of birth control (contraception) in order to prevent pregnancy and STIs (sexually transmitted infections).  If told by your health care provider, take low-dose aspirin daily starting at age 51. What's next?  Visit your health care provider once a year for a well check visit.  Ask your health care provider  how often you should have your eyes and teeth checked.  Stay up to date on all vaccines. This information is not intended to replace advice given to you by your health care provider. Make sure you discuss any questions you have with your health care provider. Document Revised: 01/15/2018 Document Reviewed: 01/15/2018 Elsevier Patient Education  2020 [employer].

## 2020-05-16 LAB — LIPID PANEL
Chol/HDL Ratio: 2.8 ratio (ref 0.0–4.4)
Cholesterol, Total: 131 mg/dL (ref 100–199)
HDL: 47 mg/dL (ref >39)
LDL Chol Calc (NIH): 67 mg/dL (ref 0–99)
Triglycerides: 91 mg/dL (ref 0–149)
VLDL Cholesterol Cal: 17 mg/dL (ref 5–40)

## 2020-05-16 LAB — COMPREHENSIVE METABOLIC PANEL
ALT: 12 IU/L (ref 0–32)
AST: 10 IU/L (ref 0–40)
Albumin/Globulin Ratio: 1.2 (ref 1.2–2.2)
Albumin: 3.8 g/dL (ref 3.8–4.8)
Alkaline Phosphatase: 76 IU/L (ref 44–121)
BUN/Creatinine Ratio: 17 (ref 12–28)
BUN: 13 mg/dL (ref 8–27)
Bilirubin Total: 0.4 mg/dL (ref 0.0–1.2)
CO2: 25 mmol/L (ref 20–29)
Calcium: 10 mg/dL (ref 8.7–10.3)
Chloride: 99 mmol/L (ref 96–106)
Creatinine, Ser: 0.77 mg/dL (ref 0.57–1.00)
GFR calc Af Amer: 96 mL/min/{1.73_m2} (ref >59)
GFR calc non Af Amer: 84 mL/min/{1.73_m2} (ref >59)
Globulin, Total: 3.3 g/dL (ref 1.5–4.5)
Glucose: 108 mg/dL — ABNORMAL HIGH (ref 65–99)
Potassium: 3.7 mmol/L (ref 3.5–5.2)
Sodium: 140 mmol/L (ref 134–144)
Total Protein: 7.1 g/dL (ref 6.0–8.5)

## 2020-05-16 LAB — CBC WITH DIFFERENTIAL/PLATELET
Basophils Absolute: 0.1 10*3/uL (ref 0.0–0.2)
Basos: 1 %
EOS (ABSOLUTE): 0.4 10*3/uL (ref 0.0–0.4)
Eos: 6 %
Hematocrit: 43.8 % (ref 34.0–46.6)
Hemoglobin: 14.5 g/dL (ref 11.1–15.9)
Immature Grans (Abs): 0 10*3/uL (ref 0.0–0.1)
Immature Granulocytes: 0 %
Lymphocytes Absolute: 2.9 10*3/uL (ref 0.7–3.1)
Lymphs: 45 %
MCH: 26.1 pg — ABNORMAL LOW (ref 26.6–33.0)
MCHC: 33.1 g/dL (ref 31.5–35.7)
MCV: 79 fL (ref 79–97)
Monocytes Absolute: 0.4 10*3/uL (ref 0.1–0.9)
Monocytes: 7 %
Neutrophils Absolute: 2.7 10*3/uL (ref 1.4–7.0)
Neutrophils: 41 %
Platelets: 250 10*3/uL (ref 150–450)
RBC: 5.56 x10E6/uL — ABNORMAL HIGH (ref 3.77–5.28)
RDW: 15.5 % — ABNORMAL HIGH (ref 11.7–15.4)
WBC: 6.5 10*3/uL (ref 3.4–10.8)

## 2020-05-16 LAB — T4, FREE: Free T4: 1.18 ng/dL (ref 0.82–1.77)

## 2020-05-16 LAB — TSH: TSH: 1.4 u[IU]/mL (ref 0.450–4.500)

## 2020-05-16 LAB — T3: T3, Total: 94 ng/dL (ref 71–180)

## 2020-05-17 LAB — HGB A1C W/O EAG: Hgb A1c MFr Bld: 6.7 % — ABNORMAL HIGH (ref 4.8–5.6)

## 2020-05-17 LAB — SPECIMEN STATUS REPORT

## 2020-05-18 NOTE — Progress Notes (Signed)
Her hemoglobin A1c shows that her diabetes is still well controlled at 6.7%. Normal kidney, liver and thyroid function Her potassium level is normal so continue on the supplement.  Okay to refill if she needs it. Her cholesterol is also well controlled.  Continue on her current medications.

## 2020-08-07 ENCOUNTER — Other Ambulatory Visit: Payer: Self-pay | Admitting: Family Medicine

## 2020-08-07 DIAGNOSIS — E782 Mixed hyperlipidemia: Secondary | ICD-10-CM

## 2020-08-07 DIAGNOSIS — E119 Type 2 diabetes mellitus without complications: Secondary | ICD-10-CM

## 2020-08-18 ENCOUNTER — Other Ambulatory Visit: Payer: Self-pay | Admitting: Family Medicine

## 2020-10-23 ENCOUNTER — Telehealth: Payer: BC Managed Care – PPO | Admitting: Family Medicine

## 2020-10-23 ENCOUNTER — Encounter: Payer: Self-pay | Admitting: Family Medicine

## 2020-10-23 VITALS — Wt 207.0 lb

## 2020-10-23 DIAGNOSIS — R059 Cough, unspecified: Secondary | ICD-10-CM

## 2020-10-23 DIAGNOSIS — E876 Hypokalemia: Secondary | ICD-10-CM

## 2020-10-23 DIAGNOSIS — I10 Essential (primary) hypertension: Secondary | ICD-10-CM

## 2020-10-23 MED ORDER — BENZONATATE 200 MG PO CAPS: 200.0000 mg | ORAL_CAPSULE | Freq: Two times a day (BID) | ORAL | 0 refills | Status: DC | PRN

## 2020-10-23 MED ORDER — POTASSIUM CHLORIDE CRYS ER 20 MEQ PO TBCR: EXTENDED_RELEASE_TABLET | ORAL | 0 refills | Status: DC

## 2020-10-23 NOTE — Progress Notes (Signed)
   Subjective:  Documentation for virtual telephone encounter.  The patient was located in her car.  The provider was located in the office. The patient did consent to this visit and is aware of possible charges through their insurance for this visit.  The other persons participating in this telemedicine service were none. Time spent on call was 16 minutes and in review of previous records 19 minutes total.  This virtual service is not related to other E/M service within previous 7 days.  99441 (5-12min) 99442 (11-36min) 99443 (21-35min)   Patient ID: Holly Hendrix, female    DOB: 02/27/1961, 60 y.o.   MRN: [id number]  HPI Chief Complaint  Patient presents with  . Cough    Cough for 2-3 weeks. Coughing so much is making her headache and stomach hurt. Taking over the counter meds,its not working.    Complains of a dry cough for the past 1-2 week.  States she did have other URI symptoms which resolved.  States her abdomen and back are sore from coughing.   Denies fever, chills, dizziness, chest pain, palpitations, shortness of breath, abdominal pain, N/V/D, urinary symptoms, LE edema.   States she had a negative Covid test on 10/05/2020 at the [facility].  This was prior to the onset of her current symptoms.  Does not want a COVID test today.  States she is taking Robitussin.  States she needs something else for her cough.  States she has been out of her potassium supplement for more that a month.  She is taking lisinopril/HCTZ.  She has a history of hypokalemia.  Has upcoming appointment with me.   Review of Systems Pertinent positives and negatives in the history of present illness.     Objective:   Physical Exam Wt 207 lb (93.9 kg)   LMP  (LMP Unknown)   BMI 35.26 kg/m   Alert and oriented and in no acute distress.  Speaking in complete sentences without difficulty.  No cough during the visit.      Assessment & Plan:  Cough - Plan: benzonatate (TESSALON) 200 MG  capsule  HYPERTENSION, BENIGN ESSENTIAL  Hypokalemia - Plan: potassium chloride SA (KLOR-CON) 20 MEQ tablet  Declines COVID testing today.  No red flag symptoms.  She may continue Robitussin or switch to Mucinex for the next 2 to 3 days.  Discussed good hydration and symptomatic management of her symptoms.  I will prescribe Tessalon for her to try for cough as well.  She will follow-up if her cough is not improving in the next 3 to 4 days.  Per patient request, potassium supplement refilled since she has been out of this for a month or so.  Upcoming appointment with me.

## 2020-10-31 ENCOUNTER — Other Ambulatory Visit: Payer: Self-pay | Admitting: Family Medicine

## 2020-10-31 DIAGNOSIS — E782 Mixed hyperlipidemia: Secondary | ICD-10-CM

## 2020-10-31 DIAGNOSIS — E119 Type 2 diabetes mellitus without complications: Secondary | ICD-10-CM

## 2020-10-31 NOTE — Telephone Encounter (Signed)
Pt has upcoming appt 

## 2020-11-07 ENCOUNTER — Other Ambulatory Visit: Payer: Self-pay | Admitting: Family Medicine

## 2020-11-09 ENCOUNTER — Ambulatory Visit: Payer: BC Managed Care – PPO | Admitting: Family Medicine

## 2020-11-09 ENCOUNTER — Other Ambulatory Visit: Payer: Self-pay

## 2020-11-09 ENCOUNTER — Encounter: Payer: Self-pay | Admitting: Family Medicine

## 2020-11-09 VITALS — BP 136/100 | HR 84 | Ht 64.0 in | Wt 210.4 lb

## 2020-11-09 DIAGNOSIS — Z23 Encounter for immunization: Secondary | ICD-10-CM | POA: Diagnosis not present

## 2020-11-09 DIAGNOSIS — E6609 Other obesity due to excess calories: Secondary | ICD-10-CM

## 2020-11-09 DIAGNOSIS — E1159 Type 2 diabetes mellitus with other circulatory complications: Secondary | ICD-10-CM

## 2020-11-09 DIAGNOSIS — E1169 Type 2 diabetes mellitus with other specified complication: Secondary | ICD-10-CM

## 2020-11-09 DIAGNOSIS — E785 Hyperlipidemia, unspecified: Secondary | ICD-10-CM

## 2020-11-09 DIAGNOSIS — Z6836 Body mass index (BMI) 36.0-36.9, adult: Secondary | ICD-10-CM

## 2020-11-09 DIAGNOSIS — I152 Hypertension secondary to endocrine disorders: Secondary | ICD-10-CM

## 2020-11-09 LAB — POCT GLYCOSYLATED HEMOGLOBIN (HGB A1C): Hemoglobin A1C: 6.7 % — AB (ref 4.0–5.6)

## 2020-11-09 NOTE — Progress Notes (Signed)
Chief Complaint  Patient presents with   Diabetes    Nonfasting med check. No concerns. Patient reminded that she is due for eye exam, she said she will schedule.     Patient is a 60 yo patient of Vickie's, being seen for 6 month med check in Vickie's absence. She had her physical and labs done 04/2020.   Diabetes:  Patient reports compliance with metformin, denies side effects. Diet: drinks some regular soda and sweet tea.  Uses artificial sweetener in her coffee, but recently is back to using regular sugar. Fast food once a week,varies. Sugars are not checked at home.  Lab Results  Component Value Date   HGBA1C 6.7 (H) 05/15/2020    HTN- reports taking medication (lisinopril HCT) daily without any concerns. Does not check BP at home. She is taking a potassium supplement--she had been out for about a month; med was refilled 6/6 at virtual visit. Denies headaches (only if she sleeps too much, since she if off work now), dizziness, chest pain or shortness of breath.  Denies edema. She just started walking yesterday. 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 She is off for the summer, has more free time.  Hyperlipidemia:  compliant with statin, denies side effects. Lipids were at goal on last check Lab Results  Component Value Date   CHOL 131 05/15/2020   HDL 47 05/15/2020   LDLCALC 67 05/15/2020   TRIG 91 05/15/2020   CHOLHDL 2.8 05/15/2020    PMH, PSH, SH reviewed  Outpatient Encounter Medications as of 11/09/2020  Medication Sig   atorvastatin (LIPITOR) 20 MG tablet Take 1 tablet by mouth once daily   lisinopril-hydrochlorothiazide (ZESTORETIC) 20-25 MG tablet Take 1 tablet by mouth once daily   metFORMIN (GLUCOPHAGE) 500 MG tablet Take 1 tablet by mouth once daily with breakfast   potassium chloride SA (KLOR-CON) 20 MEQ tablet TAKE 1  BY MOUTH ONCE DAILY   Acetaminophen (TYLENOL PO) Take 500 mg by mouth. Takes 2 tabs (Patient not taking: Reported on  11/09/2020)   [DISCONTINUED] benzonatate (TESSALON) 200 MG capsule Take 1 capsule (200 mg total) by mouth 2 (two) times daily as needed.   No facility-administered encounter medications on file as of 11/09/2020.   No Known Allergies  ROS:   no fever, chills, URI symptoms. Cough resolved.  No n/v/d, abdominal pain, urinary complaints, no muscle cramps or spasms.  Occ edema at the end of the day. Normal moods.  No joint pain. No chest pain, shortness of breath.    PHYSICAL EXAM:  BP (!) 136/100   Pulse 84   Ht 5\' 4"  (1.626 m)   Wt 210 lb 6.4 oz (95.4 kg)   LMP  (LMP Unknown)   BMI 36.12 kg/m   134/96 on repeat by MD  Wt Readings from Last 3 Encounters:  11/09/20 210 lb 6.4 oz (95.4 kg)  10/23/20 207 lb (93.9 kg)  05/15/20 209 lb 3.2 oz (94.9 kg)   Pleasant, well-appearing obese female, in good spirits HEENT: conjunctiva and sclera are clear, EOMI, wearing mask Neck: no lymphadenopathy, thyromegaly or bruit Heart: regular rate and rhythm, no murmur Lungs: clear bilaterally Back: no spinal or CVA tenderness Abdomen: soft, nontender, no mass Extremities: no edema, 2+ pulses Psych: normal mood, affect, hygiene and grooming Neuro: alert and oriented, normal strength, gait  Lab Results  Component Value Date   HGBA1C 6.7 (A) 11/09/2020     ASSESSMENT/PLAN:  Controlled type 2 diabetes mellitus  with other specified complication, without long-term current use of insulin (HCC) - complic--HTN, HLD - Plan: HgB A1c  Hypertension associated with diabetes ([facility]) - BP elevated today. Discussed low sodium diet, daily exercise, weight loss. To monitor at home and f/u if remains elevated  Hyperlipidemia associated with type 2 diabetes mellitus ([place]) - at goal per last check, cont current regimen  Need for COVID-19 vaccine - Plan: PFIZER Comirnaty(GRAY TOP)COVID-19 Vaccine  Need for Tdap vaccination - Plan: Tdap vaccine greater than or equal to 7yo IM  Class 2 obesity due to excess  calories without serious comorbidity with body mass index (BMI) of 36.0 to 36.9 in adult - comorbidity--DM, HTN, HLD. Counseled re: healthy diet, portions, exercise, wt loss  COVID booster and TdaP given. Risks/SE reviewed Review of immunizations--unclear why she has never had pneumovax (diabetic) and why she has had prevnar-13 x 3  All meds recently filled x 90d  To schedule CPE with Vickie for 6 mos

## 2020-11-09 NOTE — Patient Instructions (Addendum)
Try and cut back on regular soda--drink more water, or some diet soda (limited). Cut back on sweet tea--you can taper off by using 1/2 sweet and half unsweet, and work your way down to [employer] with artifical sweetener if needed.  When getting fast food, don't get combos (avoid the sugary drinks and the french fries), and try and get grilled chicken.  You can opt for healthier substitute to the fries, and get a non-sugary beverage (diet soda, water).  Please try and limit the sodium in your diet--see handout. Try and check your blood pressure elsewhere (at pharmacy, or potentially getting a blood pressure monitor for home). Goal blood pressure is 130/80 or less. If you find it is running consistently 135-140/85-90 or higher, please return to the office sooner than your December visit. Bring your list of blood pressures, and your monitor (if you have one) so that we can verify the accuracy to know if we can trust those readings.   Low-Sodium Eating Plan Sodium, which is an element that makes up salt, helps you maintain a healthy balance of fluids in your body. Too much sodium can increase your bloodpressure and cause fluid and waste to be held in your body. Your health care provider or dietitian may recommend following this plan if you have high blood pressure (hypertension), kidney disease, liver disease, or heart failure. Eating less sodium can help lower your blood pressure, reduce swelling, and protect your heart, liver, andkidneys. What are tips for following this plan? Reading food labels The Nutrition Facts label lists the amount of sodium in one serving of the food. If you eat more than one serving, you must multiply the listed amount of sodium by the number of servings. Choose foods with less than 140 mg of sodium per serving. Avoid foods with 300 mg of sodium or more per serving. Shopping  Look for lower-sodium products, often labeled as "low-sodium" or "no salt added." Always check  the sodium content, even if foods are labeled as "unsalted" or "no salt added." Buy fresh foods. Avoid canned foods and pre-made or frozen meals. Avoid canned, cured, or processed meats. Buy breads that have less than 80 mg of sodium per slice.  Cooking  Eat more home-cooked food and less restaurant, buffet, and fast food. Avoid adding salt when cooking. Use salt-free seasonings or herbs instead of table salt or sea salt. Check with your health care provider or pharmacist before using salt substitutes. Cook with plant-based oils, such as canola, sunflower, or olive oil.  Meal planning When eating at a restaurant, ask that your food be prepared with less salt or no salt, if possible. Avoid dishes labeled as brined, pickled, cured, smoked, or made with soy sauce, miso, or teriyaki sauce. Avoid foods that contain MSG (monosodium glutamate). MSG is sometimes added to [place] food, bouillon, and some canned foods. Make meals that can be grilled, baked, poached, roasted, or steamed. These are generally made with less sodium. General information Most people on this plan should limit their sodium intake to 1,500-2,000 mg (milligrams) of sodium each day. What foods should I eat? Fruits Fresh, frozen, or canned fruit. Fruit juice. Vegetables Fresh or frozen vegetables. "No salt added" canned vegetables. "No salt added"tomato sauce and paste. Low-sodium or reduced-sodium tomato and vegetable juice. Grains Low-sodium cereals, including oats, puffed wheat and rice, and shredded wheat. Low-sodium crackers. Unsalted rice. Unsalted pasta. Low-sodium bread.Whole-grain breads and whole-grain pasta. Meats and other proteins Fresh or frozen (no salt added) meat, poultry, seafood,  and fish. Low-sodium canned tuna and salmon. Unsalted nuts. Dried peas, beans, and lentils withoutadded salt. Unsalted canned beans. Eggs. Unsalted nut butters. Dairy Milk. Soy milk. Cheese that is naturally low in sodium, such as  ricotta cheese, fresh mozzarella, or Swiss cheese. Low-sodium or reduced-sodium cheese. Creamcheese. Yogurt. Seasonings and condiments Fresh and dried herbs and spices. Salt-free seasonings. Low-sodium mustard and ketchup. Sodium-free salad dressing. Sodium-free light mayonnaise. Fresh orrefrigerated horseradish. Lemon juice. Vinegar. Other foods Homemade, reduced-sodium, or low-sodium soups. Unsalted popcorn and pretzels.Low-salt or salt-free chips. The items listed above may not be a complete list of foods and beverages you can eat. Contact a dietitian for more information. What foods should I avoid? Vegetables Sauerkraut, pickled vegetables, and relishes. Olives. [place] fries. Onion rings. Regular canned vegetables (not low-sodium or reduced-sodium). Regular canned tomato sauce and paste (not low-sodium or reduced-sodium). Regular tomato and vegetable juice (not low-sodium or reduced-sodium). Frozenvegetables in sauces. Grains Instant hot cereals. Bread stuffing, pancake, and biscuit mixes. Croutons. Seasoned rice or pasta mixes. Noodle soup cups. Boxed or frozen macaroni andcheese. Regular salted crackers. Self-rising flour. Meats and other proteins Meat or fish that is salted, canned, smoked, spiced, or pickled. Precooked or cured meat, such as sausages or meat loaves. Berniece Salines. Ham. Pepperoni. Hot dogs. Corned beef. Chipped beef. Salt pork. Jerky. Pickled herring. Anchovies andsardines. Regular canned tuna. Salted nuts. Dairy Processed cheese and cheese spreads. Hard cheeses. Cheese curds. Blue cheese.Feta cheese. String cheese. Regular cottage cheese. Buttermilk. Canned milk. Fats and oils Salted butter. Regular margarine. Ghee. Bacon fat. Seasonings and condiments Onion salt, garlic salt, seasoned salt, table salt, and sea salt. Canned and packaged gravies. Worcestershire sauce. Tartar sauce. Barbecue sauce. Teriyaki sauce. Soy sauce, including reduced-sodium. Steak sauce. Fish sauce. Oyster  sauce. Cocktail sauce. Horseradish that you find on the shelf. Regular ketchup and mustard. Meat flavorings and tenderizers. Bouillon cubes. Hot sauce. Pre-made or packaged marinades. Pre-made or packaged taco seasonings. Relishes.Regular salad dressings. Salsa. Other foods Salted popcorn and pretzels. Corn chips and puffs. Potato and tortilla chips.Canned or dried soups. Pizza. Frozen entrees and pot pies. The items listed above may not be a complete list of foods and beverages you should avoid. Contact a dietitian for more information. Summary Eating less sodium can help lower your blood pressure, reduce swelling, and protect your heart, liver, and kidneys. Most people on this plan should limit their sodium intake to 1,500-2,000 mg (milligrams) of sodium each day. Canned, boxed, and frozen foods are high in sodium. Restaurant foods, fast foods, and pizza are also very high in sodium. You also get sodium by adding salt to food. Try to cook at home, eat more fresh fruits and vegetables, and eat less fast food and canned, processed, or prepared foods. This information is not intended to replace advice given to you by your health care provider. Make sure you discuss any questions you have with your healthcare provider. Document Revised: 06/11/2019 Document Reviewed: 04/07/2019 Elsevier Patient Education  2022 [employer].

## 2020-11-13 ENCOUNTER — Ambulatory Visit: Payer: BC Managed Care – PPO | Admitting: Family Medicine

## 2020-11-15 ENCOUNTER — Other Ambulatory Visit: Payer: Self-pay | Admitting: Family Medicine

## 2020-11-15 DIAGNOSIS — Z1231 Encounter for screening mammogram for malignant neoplasm of breast: Secondary | ICD-10-CM

## 2020-11-17 ENCOUNTER — Ambulatory Visit: Payer: BC Managed Care – PPO

## 2021-01-04 ENCOUNTER — Ambulatory Visit
Admission: RE | Admit: 2021-01-04 | Discharge: 2021-01-04 | Disposition: A | Discharge status: Home / Self Care | Payer: Self-pay | Source: Ambulatory Visit | Attending: Family Medicine | Admitting: Family Medicine

## 2021-01-04 ENCOUNTER — Other Ambulatory Visit: Payer: Self-pay

## 2021-01-04 DIAGNOSIS — Z1231 Encounter for screening mammogram for malignant neoplasm of breast: Secondary | ICD-10-CM

## 2021-01-09 ENCOUNTER — Ambulatory Visit: Payer: BC Managed Care – PPO

## 2021-01-31 ENCOUNTER — Encounter: Payer: Self-pay | Admitting: Family Medicine

## 2021-01-31 ENCOUNTER — Ambulatory Visit: Payer: BC Managed Care – PPO | Admitting: Family Medicine

## 2021-01-31 ENCOUNTER — Other Ambulatory Visit: Payer: Self-pay

## 2021-01-31 DIAGNOSIS — M79605 Pain in left leg: Secondary | ICD-10-CM

## 2021-01-31 NOTE — Progress Notes (Signed)
   Subjective:    Patient ID: Holly Hendrix, female    DOB: 1960-08-16, 60 y.o.   MRN: [id number]  HPI Chief Complaint  Patient presents with   other    Lt. Leg pain doing better today, felt pulled muscle, does a lot of walking at her job and joined a fitness center, started hurting a weeks ago    She has been having upper thigh pain, a pulling sensation. States she has no pain today. No symptoms.    Review of Systems Pertinent positives and negatives in the history of present illness.     Objective:   Physical Exam BP 124/80   Pulse 100   Temp 98 F (36.7 C)   Wt 211 lb 6.4 oz (95.9 kg)   LMP  (LMP Unknown)   BMI 36.29 kg/m   Normal leg exam      Assessment & Plan:  Erroneous encounter - disregard  Pain of left lower extremity  She ambulated with me around the office and she has no pain. Normal today.  No charge for visit.

## 2021-02-03 ENCOUNTER — Other Ambulatory Visit: Payer: Self-pay | Admitting: Family Medicine

## 2021-02-03 DIAGNOSIS — E876 Hypokalemia: Secondary | ICD-10-CM

## 2021-02-05 ENCOUNTER — Other Ambulatory Visit: Payer: Self-pay | Admitting: Family Medicine

## 2021-02-05 DIAGNOSIS — E782 Mixed hyperlipidemia: Secondary | ICD-10-CM

## 2021-02-05 DIAGNOSIS — E119 Type 2 diabetes mellitus without complications: Secondary | ICD-10-CM

## 2021-05-23 ENCOUNTER — Encounter: Payer: BC Managed Care – PPO | Admitting: Family Medicine

## 2021-07-09 ENCOUNTER — Encounter: Payer: BC Managed Care – PPO | Admitting: Physician Assistant

## 2021-07-26 ENCOUNTER — Encounter: Payer: Self-pay | Admitting: Physician Assistant

## 2021-07-26 ENCOUNTER — Other Ambulatory Visit: Payer: Self-pay

## 2021-07-26 ENCOUNTER — Ambulatory Visit: Payer: BC Managed Care – PPO | Admitting: Physician Assistant

## 2021-07-26 VITALS — BP 120/70 | HR 97 | Ht 64.0 in | Wt 203.2 lb

## 2021-07-26 DIAGNOSIS — E1169 Type 2 diabetes mellitus with other specified complication: Secondary | ICD-10-CM | POA: Diagnosis not present

## 2021-07-26 DIAGNOSIS — Z0000 Encounter for general adult medical examination without abnormal findings: Secondary | ICD-10-CM

## 2021-07-26 DIAGNOSIS — I10 Essential (primary) hypertension: Secondary | ICD-10-CM

## 2021-07-26 DIAGNOSIS — E876 Hypokalemia: Secondary | ICD-10-CM | POA: Diagnosis not present

## 2021-07-26 DIAGNOSIS — E785 Hyperlipidemia, unspecified: Secondary | ICD-10-CM

## 2021-07-26 DIAGNOSIS — E119 Type 2 diabetes mellitus without complications: Secondary | ICD-10-CM | POA: Insufficient documentation

## 2021-07-26 DIAGNOSIS — E118 Type 2 diabetes mellitus with unspecified complications: Secondary | ICD-10-CM | POA: Insufficient documentation

## 2021-07-26 LAB — LIPID PANEL

## 2021-07-26 MED ORDER — LISINOPRIL-HYDROCHLOROTHIAZIDE 20-25 MG PO TABS: 1.0000 | ORAL_TABLET | Freq: Every day | ORAL | 1 refills | Status: DC

## 2021-07-26 MED ORDER — ATORVASTATIN CALCIUM 20 MG PO TABS: 20.0000 mg | ORAL_TABLET | Freq: Every day | ORAL | 1 refills | Status: DC

## 2021-07-26 MED ORDER — METFORMIN HCL 500 MG PO TABS: 500.0000 mg | ORAL_TABLET | Freq: Every day | ORAL | 1 refills | Status: DC

## 2021-07-26 MED ORDER — POTASSIUM CHLORIDE CRYS ER 20 MEQ PO TBCR: EXTENDED_RELEASE_TABLET | ORAL | 1 refills | Status: DC

## 2021-07-26 NOTE — Progress Notes (Signed)
? ?Established Patient Office Visit ? ?Subjective:  ?Patient ID: Holly Hendrix, female    DOB: [birth year]  Age: 61 y.o. MRN: [id number] ? ?CC:  ?Chief Complaint  ?Patient presents with  ? Annual Exam  ?  Fasting CPE.  ? ? ?HPI ?Holly Hendrix presents for  annual exam.  ? ?Past Medical History:  ?Diagnosis Date  ? Diabetes mellitus type 2, controlled, without complications ([place])   ? Elevated LDL cholesterol level   ? Headache   ? History of low potassium   ? Hypertension   ? PMB (postmenopausal bleeding)   ? ? ?Past Surgical History:  ?Procedure Laterality Date  ? DILATATION & CURETTAGE/HYSTEROSCOPY WITH MYOSURE N/A 09/27/2019  ? Procedure: [place];  Surgeon: Megan Salon, MD;  Location: [facility];  Service: Gynecology;  Laterality: N/A;  to follow first case of AM  ? INDUCED ABORTION    ? TUBAL LIGATION    ? ? ?Family History  ?Problem Relation Age of Onset  ? Arthritis Sister   ? Diabetes Sister   ? Hypertension Sister   ? Cancer Father   ?     unknown   ? Arthritis Brother   ? Ulcers Brother   ? Diabetes Sister   ? Hypertension Sister   ? Arthritis Sister   ? Colon cancer Neg Hx   ? ? ?Social History  ? ?Socioeconomic History  ? Marital status: Divorced  ?  Spouse name: Not on file  ? Number of children: Not on file  ? Years of education: Not on file  ? Highest education level: Not on file  ?Occupational History  ? Not on file  ?Tobacco Use  ? Smoking status: Never  ? Smokeless tobacco: Never  ?Vaping Use  ? Vaping Use: Never used  ?Substance and Sexual Activity  ? Alcohol use: No  ? Drug use: No  ? Sexual activity: Not Currently  ?  Birth control/protection: Post-menopausal  ?Other Topics Concern  ? Not on file  ?Social History Narrative  ? Not on file  ? ?Social Determinants of Health  ? ?Financial Resource Strain: Not on file  ?Food Insecurity: Not on file  ?Transportation Needs: Not on file  ?Physical Activity: Not on file  ?Stress: Not on file   ?Social Connections: Not on file  ?Intimate Partner Violence: Not on file  ? ? ?Outpatient Medications Prior to Visit  ?Medication Sig Dispense Refill  ? Acetaminophen (TYLENOL PO) Take 500 mg by mouth. Takes 2 tabs    ? atorvastatin (LIPITOR) 20 MG tablet Take 1 tablet by mouth once daily 90 tablet 1  ? lisinopril-hydrochlorothiazide (ZESTORETIC) 20-25 MG tablet Take 1 tablet by mouth once daily 90 tablet 1  ? metFORMIN (GLUCOPHAGE) 500 MG tablet Take 1 tablet by mouth once daily with breakfast 90 tablet 1  ? naproxen sodium (ALEVE) 220 MG tablet Take 220 mg by mouth.    ? potassium chloride SA (KLOR-CON) 20 MEQ tablet Take 1 tablet by mouth once daily 90 tablet 1  ? ?No facility-administered medications prior to visit.  ? ? ?No Known Allergies ? ?ROS ?Review of Systems  ?Constitutional:  Negative for activity change and fever.  ?HENT:  Negative for congestion, ear pain and voice change.   ?Eyes:  Negative for redness.  ?Respiratory:  Negative for cough.   ?Cardiovascular:  Negative for chest pain.  ?Gastrointestinal:  Negative for constipation and diarrhea.  ?Endocrine: Negative for polyuria.  ?Genitourinary:  Negative for flank pain.  ?Musculoskeletal:  Negative for gait problem and neck stiffness.  ?Skin:  Negative for color change and rash.  ?Neurological:  Negative for dizziness.  ?Hematological:  Negative for adenopathy.  ?Psychiatric/Behavioral:  Negative for agitation, behavioral problems and confusion.   ? ?  ?Objective:  ?  ?Physical Exam ?Vitals and nursing note reviewed.  ?Constitutional:   ?   General: She is not in acute distress. ?   Appearance: Normal appearance.  ?HENT:  ?   Head: Normocephalic and atraumatic.  ?   Right Ear: Tympanic membrane, ear canal and external ear normal.  ?   Left Ear: Tympanic membrane, ear canal and external ear normal.  ?Eyes:  ?   Conjunctiva/sclera: Conjunctivae normal.  ?   Pupils: Pupils are equal, round, and reactive to light.  ?Neck:  ?   Vascular: No carotid  bruit.  ?Cardiovascular:  ?   Rate and Rhythm: Normal rate and regular rhythm.  ?   Pulses: Normal pulses.  ?   Heart sounds: Normal heart sounds.  ?Pulmonary:  ?   Effort: Pulmonary effort is normal. No respiratory distress.  ?   Breath sounds: Normal breath sounds. No wheezing.  ?Abdominal:  ?   General: Bowel sounds are normal.  ?   Palpations: Abdomen is soft.  ?Musculoskeletal:     ?   General: Normal range of motion.  ?   Cervical back: Normal range of motion and neck supple.  ?   Right lower leg: No edema.  ?   Left lower leg: No edema.  ?Skin: ?   General: Skin is warm and dry.  ?   Findings: No rash.  ?Neurological:  ?   General: No focal deficit present.  ?   Mental Status: She is alert and oriented to person, place, and time.  ?   Gait: Gait normal.  ?Psychiatric:     ?   Mood and Affect: Mood normal.     ?   Behavior: Behavior normal.  ? ? ?BP 120/70   Pulse 97   Ht '5\' 4"'$  (1.626 m)   Wt 203 lb 3.2 oz (92.2 kg)   LMP  (LMP Unknown)   SpO2 99%   BMI 34.88 kg/m?  ? ?Wt Readings from Last 3 Encounters:  ?07/26/21 203 lb 3.2 oz (92.2 kg)  ?01/31/21 211 lb 6.4 oz (95.9 kg)  ?11/09/20 210 lb 6.4 oz (95.4 kg)  ? ? ? ?Health Maintenance Due  ?Topic Date Due  ? OPHTHALMOLOGY EXAM  Never done  ? COVID-19 Vaccine (5 - Booster for Pfizer series) 01/04/2021  ? HEMOGLOBIN A1C  05/11/2021  ? FOOT EXAM  05/15/2021  ? ? ?There are no preventive care reminders to display for this patient. ? ?Lab Results  ?Component Value Date  ? TSH 1.400 05/15/2020  ? ?Lab Results  ?Component Value Date  ? WBC 6.5 05/15/2020  ? HGB 14.5 05/15/2020  ? HCT 43.8 05/15/2020  ? MCV 79 05/15/2020  ? PLT 250 05/15/2020  ? ?Lab Results  ?Component Value Date  ? NA 140 05/15/2020  ? K 3.7 05/15/2020  ? CO2 25 05/15/2020  ? GLUCOSE 108 (H) 05/15/2020  ? BUN 13 05/15/2020  ? CREATININE 0.77 05/15/2020  ? BILITOT 0.4 05/15/2020  ? ALKPHOS 76 05/15/2020  ? AST 10 05/15/2020  ? ALT 12 05/15/2020  ? PROT 7.1 05/15/2020  ? ALBUMIN 3.8 05/15/2020  ?  CALCIUM 10.0 05/15/2020  ? ANIONGAP 10 09/23/2019  ? ?  Lab Results  ?Component Value Date  ? CHOL 131 05/15/2020  ? ?Lab Results  ?Component Value Date  ? HDL 47 05/15/2020  ? ?Lab Results  ?Component Value Date  ? [place] 67 05/15/2020  ? ?Lab Results  ?Component Value Date  ? TRIG 91 05/15/2020  ? ?Lab Results  ?Component Value Date  ? CHOLHDL 2.8 05/15/2020  ? ?Lab Results  ?Component Value Date  ? HGBA1C 6.7 (A) 11/09/2020  ? ? ?  ?Assessment & Plan:  ? ?Problem List Items Addressed This Visit   ?None ? ? ?No orders of the defined types were placed in this encounter. ? ? ?Follow-up: No follow-ups on file.  ? ? ?Irene Pap, PA-C ?

## 2021-07-27 LAB — COMPREHENSIVE METABOLIC PANEL
ALT: 12 IU/L (ref 0–32)
AST: 14 IU/L (ref 0–40)
Albumin/Globulin Ratio: 1.5 (ref 1.2–2.2)
Albumin: 4.1 g/dL (ref 3.8–4.8)
Alkaline Phosphatase: 78 IU/L (ref 44–121)
BUN/Creatinine Ratio: 23 (ref 12–28)
BUN: 16 mg/dL (ref 8–27)
Bilirubin Total: 0.7 mg/dL (ref 0.0–1.2)
CO2: 25 mmol/L (ref 20–29)
Calcium: 9.6 mg/dL (ref 8.7–10.3)
Chloride: 104 mmol/L (ref 96–106)
Creatinine, Ser: 0.7 mg/dL (ref 0.57–1.00)
Globulin, Total: 2.8 g/dL (ref 1.5–4.5)
Glucose: 94 mg/dL (ref 70–99)
Potassium: 3.7 mmol/L (ref 3.5–5.2)
Sodium: 143 mmol/L (ref 134–144)
Total Protein: 6.9 g/dL (ref 6.0–8.5)
eGFR: 98 mL/min/{1.73_m2} (ref >59)

## 2021-07-27 LAB — CBC WITH DIFFERENTIAL/PLATELET
Basophils Absolute: 0.1 10*3/uL (ref 0.0–0.2)
Basos: 1 %
EOS (ABSOLUTE): 0.3 10*3/uL (ref 0.0–0.4)
Eos: 4 %
Hematocrit: 40.7 % (ref 34.0–46.6)
Hemoglobin: 13.5 g/dL (ref 11.1–15.9)
Immature Grans (Abs): 0 10*3/uL (ref 0.0–0.1)
Immature Granulocytes: 0 %
Lymphocytes Absolute: 3.6 10*3/uL — ABNORMAL HIGH (ref 0.7–3.1)
Lymphs: 47 %
MCH: 25.8 pg — ABNORMAL LOW (ref 26.6–33.0)
MCHC: 33.2 g/dL (ref 31.5–35.7)
MCV: 78 fL — ABNORMAL LOW (ref 79–97)
Monocytes Absolute: 0.5 10*3/uL (ref 0.1–0.9)
Monocytes: 6 %
Neutrophils Absolute: 3.2 10*3/uL (ref 1.4–7.0)
Neutrophils: 42 %
Platelets: 239 10*3/uL (ref 150–450)
RBC: 5.24 x10E6/uL (ref 3.77–5.28)
RDW: 14.5 % (ref 11.7–15.4)
WBC: 7.7 10*3/uL (ref 3.4–10.8)

## 2021-07-27 LAB — LIPID PANEL
Chol/HDL Ratio: 2.2 ratio (ref 0.0–4.4)
Cholesterol, Total: 103 mg/dL (ref 100–199)
HDL: 47 mg/dL (ref >39)
LDL Chol Calc (NIH): 43 mg/dL (ref 0–99)
Triglycerides: 53 mg/dL (ref 0–149)
VLDL Cholesterol Cal: 13 mg/dL (ref 5–40)

## 2021-07-27 LAB — HEMOGLOBIN A1C
Est. average glucose Bld gHb Est-mCnc: 143 mg/dL
Hgb A1c MFr Bld: 6.6 % — ABNORMAL HIGH (ref 4.8–5.6)

## 2021-11-16 ENCOUNTER — Encounter: Payer: Self-pay | Admitting: Physician Assistant

## 2021-11-16 ENCOUNTER — Encounter: Payer: Self-pay | Admitting: Internal Medicine

## 2021-11-16 ENCOUNTER — Ambulatory Visit: Payer: BC Managed Care – PPO | Admitting: Physician Assistant

## 2021-11-16 ENCOUNTER — Ambulatory Visit
Admission: RE | Admit: 2021-11-16 | Discharge: 2021-11-16 | Disposition: A | Discharge status: Home / Self Care | Payer: BC Managed Care – PPO | Source: Ambulatory Visit | Attending: Physician Assistant | Admitting: Physician Assistant

## 2021-11-16 VITALS — BP 128/78 | HR 82 | Temp 96.8°F | Ht 64.0 in | Wt 201.4 lb

## 2021-11-16 DIAGNOSIS — M25511 Pain in right shoulder: Secondary | ICD-10-CM

## 2021-11-16 DIAGNOSIS — M79621 Pain in right upper arm: Secondary | ICD-10-CM

## 2021-11-16 NOTE — Progress Notes (Signed)
Acute Office Visit  Subjective:    Patient ID: Holly Hendrix, female    DOB: [birth year], 61 y.o.   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  Outpatient Medications Prior to Visit  Medication Sig Dispense Refill   Acetaminophen (TYLENOL PO) Take 500 mg by mouth. Takes 2 tabs     atorvastatin (LIPITOR) 20 MG tablet Take 1 tablet (20 mg total) by mouth daily. 90 tablet 1   Liniments (ICE MENTHOL EX) Apply topically.     lisinopril-hydrochlorothiazide (ZESTORETIC) 20-25 MG tablet Take 1 tablet by mouth daily. 90 tablet 1   metFORMIN (GLUCOPHAGE) 500 MG tablet Take 1 tablet (500 mg total) by mouth daily with breakfast. 90 tablet 1   potassium chloride SA (KLOR-CON M) 20 MEQ tablet Take 1 tablet by mouth once daily 90 tablet 1   naproxen sodium (ALEVE) 220 MG tablet Take 220 mg by mouth. (Patient not taking: Reported on 11/16/2021)     No facility-administered medications prior to visit.    No Known Allergies  Review of Systems  Constitutional:  Negative for activity change and chills.  HENT:  Negative for congestion and voice change.   Eyes:  Negative for pain and redness.  Respiratory:  Negative for cough and wheezing.   Cardiovascular:  Negative for chest pain.  Gastrointestinal:  Negative for constipation, diarrhea, nausea and vomiting.  Endocrine: Negative for polyuria.  Genitourinary:  Negative for frequency.  Musculoskeletal:  Positive for arthralgias and myalgias. Negative for joint swelling, neck pain and neck stiffness.  Skin:   Negative for color change and rash.  Allergic/Immunologic: Negative for immunocompromised state.  Neurological:  Negative for dizziness.  Psychiatric/Behavioral:  Negative for agitation.        Objective:    Physical Exam Vitals and nursing note reviewed.  Constitutional:      General: She is not in acute distress.    Appearance: Normal appearance. She is not ill-appearing.  HENT:     Head: Normocephalic and atraumatic.     Right Ear: External ear normal.     Left Ear: External ear normal.     Nose: No congestion.  Eyes:     Extraocular Movements: Extraocular movements intact.     Conjunctiva/sclera: Conjunctivae normal.     Pupils: Pupils are equal, round, and reactive to light.  Cardiovascular:     Rate and Rhythm: Normal rate and regular rhythm.     Pulses: Normal pulses.     Heart sounds: Normal heart sounds.  Pulmonary:     Effort: Pulmonary effort is normal.     Breath sounds: Normal breath sounds. No wheezing.  Abdominal:     General: Bowel sounds are normal.     Palpations: Abdomen is soft.  Musculoskeletal:     Right shoulder: Tenderness present. No swelling. Decreased range of motion. Normal pulse.     Left shoulder: Normal. No swelling or tenderness. Normal range of motion. Normal pulse.  Cervical back: Normal range of motion and neck supple.     Right lower leg: No edema.     Left lower leg: No edema.  Skin:    General: Skin is warm and dry.     Findings: No bruising.  Neurological:     General: No focal deficit present.     Mental Status: She is alert and oriented to person, place, and time.  Psychiatric:        Mood and Affect: Mood normal.        Behavior: Behavior normal.        Thought Content: Thought content normal.     BP 128/78   Pulse 82   Temp (!) 96.8 F (36 C)   Ht $R'5\' 4"'TQ$  (1.626 m)   Wt 201 lb 6.4 oz (91.4 kg)   LMP  (LMP Unknown)   SpO2 96%   BMI 34.57 kg/m   Wt Readings from Last 3 Encounters:  11/16/21 201 lb 6.4 oz (91.4  kg)  07/26/21 203 lb 3.2 oz (92.2 kg)  01/31/21 211 lb 6.4 oz (95.9 kg)    Results for orders placed or performed in visit on 07/26/21  CBC with Differential/Platelet  Result Value Ref Range   WBC 7.7 3.4 - 10.8 x10E3/uL   RBC 5.24 3.77 - 5.28 x10E6/uL   Hemoglobin 13.5 11.1 - 15.9 g/dL   Hematocrit 40.7 34.0 - 46.6 %   MCV 78 (L) 79 - 97 fL   MCH 25.8 (L) 26.6 - 33.0 pg   MCHC 33.2 31.5 - 35.7 g/dL   RDW 14.5 11.7 - 15.4 %   Platelets 239 150 - 450 x10E3/uL   Neutrophils 42 Not Estab. %   Lymphs 47 Not Estab. %   Monocytes 6 Not Estab. %   Eos 4 Not Estab. %   Basos 1 Not Estab. %   Neutrophils Absolute 3.2 1.4 - 7.0 x10E3/uL   Lymphocytes Absolute 3.6 (H) 0.7 - 3.1 x10E3/uL   Monocytes Absolute 0.5 0.1 - 0.9 x10E3/uL   EOS (ABSOLUTE) 0.3 0.0 - 0.4 x10E3/uL   Basophils Absolute 0.1 0.0 - 0.2 x10E3/uL   Immature Granulocytes 0 Not Estab. %   Immature Grans (Abs) 0.0 0.0 - 0.1 x10E3/uL  Comprehensive metabolic panel  Result Value Ref Range   Glucose 94 70 - 99 mg/dL   BUN 16 8 - 27 mg/dL   Creatinine, Ser 0.70 0.57 - 1.00 mg/dL   eGFR 98 >59 mL/min/1.73   BUN/Creatinine Ratio 23 12 - 28   Sodium 143 134 - 144 mmol/L   Potassium 3.7 3.5 - 5.2 mmol/L   Chloride 104 96 - 106 mmol/L   CO2 25 20 - 29 mmol/L   Calcium 9.6 8.7 - 10.3 mg/dL   Total Protein 6.9 6.0 - 8.5 g/dL   Albumin 4.1 3.8 - 4.8 g/dL   Globulin, Total 2.8 1.5 - 4.5 g/dL   Albumin/Globulin Ratio 1.5 1.2 - 2.2   Bilirubin Total 0.7 0.0 - 1.2 mg/dL   Alkaline Phosphatase 78 44 - 121 IU/L   AST 14 0 - 40 IU/L   ALT 12 0 - 32 IU/L  Hemoglobin A1c  Result Value Ref Range   Hgb A1c MFr Bld 6.6 (H) 4.8 - 5.6 %   Est. average glucose Bld gHb Est-mCnc 143 mg/dL  Lipid panel  Result Value Ref Range   Cholesterol, Total 103 100 - 199 mg/dL   Triglycerides 53 0 - 149 mg/dL   HDL 47 >  39 mg/dL   VLDL Cholesterol Cal 13 5 - 40 mg/dL   LDL Chol Calc (NIH) 43 0 - 99 mg/dL   Chol/HDL Ratio 2.2 0.0 - 4.4 ratio        Assessment & Plan:  1. Acute pain of right shoulder - DG Shoulder Right; Future - Ambulatory referral to Orthopedics  2. Pain of right upper arm - DG Shoulder Right; Future - Ambulatory referral to Orthopedics  - OTC Tylenol and Voltaren gel as needed - referral to Orthopedics - right shoulder x-ray  No orders of the defined types were placed in this encounter.   Return for Return as Already Scheduled.  Irene Pap, PA-C

## 2021-11-16 NOTE — Patient Instructions (Signed)
You can walk in for x-ray at ---  [facility] [address] [place], [place] [address]  Phone [phone] Fax [phone]  Hours of Operation General hours of operation are Monday - Friday, 8 am-5 pm   Continue to use Tylenol and/or Voltaren Gel as needed  You will get a call to schedule an appointment with Orthopedics

## 2021-11-21 ENCOUNTER — Ambulatory Visit (INDEPENDENT_AMBULATORY_CARE_PROVIDER_SITE_OTHER): Payer: BC Managed Care – PPO

## 2021-11-21 ENCOUNTER — Encounter: Payer: Self-pay | Admitting: Orthopaedic Surgery

## 2021-11-21 ENCOUNTER — Ambulatory Visit: Payer: Self-pay

## 2021-11-21 ENCOUNTER — Ambulatory Visit: Payer: BC Managed Care – PPO | Admitting: Orthopaedic Surgery

## 2021-11-21 DIAGNOSIS — M25511 Pain in right shoulder: Secondary | ICD-10-CM

## 2021-11-21 DIAGNOSIS — M25512 Pain in left shoulder: Secondary | ICD-10-CM

## 2021-11-21 DIAGNOSIS — G8929 Other chronic pain: Secondary | ICD-10-CM

## 2021-11-21 NOTE — Addendum Note (Signed)
Addended by: Lendon Collar on: 11/21/2021 02:09 PM   Modules accepted: Orders

## 2021-11-21 NOTE — Progress Notes (Addendum)
Office Visit Note   Patient: Holly Hendrix           Date of Birth: 17-Nov-1960           MRN: [id number] Visit Date: 11/21/2021              Requested by: Irene Pap, PA-C [place],  [place] [id number] PCP: Irene Pap, PA-C   Assessment & Plan: Visit Diagnoses:  1. Chronic pain of both shoulders     Plan: Impression is traumatic right rotator cuff tear.  Will need MRI to assess for structural abnormalities.  Follow-up after the MRI.  Follow-Up Instructions: No follow-ups on file.   Orders:  Orders Placed This Encounter  Procedures   XR Shoulder Left   XR Shoulder Right   No orders of the defined types were placed in this encounter.     Procedures: No procedures performed   Clinical Data: No additional findings.   Subjective: Chief Complaint  Patient presents with   Right Shoulder - Pain   Left Shoulder - Pain    HPI Holly Hendrix is a 61 year old female with chronic right shoulder pain for a month.  Started after several falls.  Does a lot of lifting at work.  She has trouble lifting her hand above shoulder and head.  Right-hand-dominant.  Has been using Tylenol and IcyHot without any relief.  Denies any radicular symptoms.  Left shoulder causes mild pain.  Review of Systems  Constitutional: Negative.   HENT: Negative.    Eyes: Negative.   Respiratory: Negative.    Cardiovascular: Negative.   Endocrine: Negative.   Musculoskeletal: Negative.   Neurological: Negative.   Hematological: Negative.   Psychiatric/Behavioral: Negative.    All other systems reviewed and are negative.    Objective: Vital Signs: LMP  (LMP Unknown)   Physical Exam Vitals and nursing note reviewed.  Constitutional:      Appearance: She is well-developed.  HENT:     Head: Normocephalic and atraumatic.     Nose: Nose normal.  Eyes:     Extraocular Movements: Extraocular movements intact.  Cardiovascular:     Pulses: Normal pulses.  Pulmonary:      Effort: Pulmonary effort is normal.  Abdominal:     Palpations: Abdomen is soft.  Musculoskeletal:     Cervical back: Neck supple.  Skin:    General: Skin is warm.     Capillary Refill: Capillary refill takes less than 2 seconds.  Neurological:     Mental Status: She is alert and oriented to person, place, and time. Mental status is at baseline.  Psychiatric:        Behavior: Behavior normal.        Thought Content: Thought content normal.        Judgment: Judgment normal.     Ortho Exam Examination of the right shoulder girdle shows no masses lesions or swelling.  Normal passive range of motion with slight pain.  Active range of motion is moderately decreased consistent with pseudoparalysis.  Positive drop arm.  Pain with impingement.  Pain at the [facility] joint.  Examination of the left shoulder show good active and passive range of motion.  Rotator cuff strength normal to manual muscle testing.  No significant pain with impingement test. Specialty Comments:  No specialty comments available.  Imaging: XR Shoulder Left  Result Date: 11/21/2021 Mild irregularity of the greater tuberosity.  Degenerative changes of the [facility] joint.  XR Shoulder Right  Result  Date: 11/21/2021 Spurring and irregularity of the greater tuberosity.  Degenerative changes of the [facility] joint.  No acute abnormalities.    PMFS History: Patient Active Problem List   Diagnosis Date Noted   Controlled type 2 diabetes mellitus without complication, without long-term current use of insulin ([place]) 07/26/2021   Hypokalemia 07/26/2021   Lymphocytosis 04/07/2019   Intramural uterine fibroid 02/14/2019   Chronic foot pain, left 03/16/2018   Enlarged thyroid 08/27/2017   Estrogen deficiency 08/27/2017   Obesity (BMI 30-39.9) 06/13/2016   Hyperlipidemia associated with type 2 diabetes mellitus ([place]) 07/29/2013   DENTAL CARIES 10/05/2009   INCONTINENCE, URGE 08/02/2009   HYPERTENSION, BENIGN ESSENTIAL 06/06/2008    ALLERGIC RHINITIS 06/06/2008   Past Medical History:  Diagnosis Date   Diabetes mellitus type 2, controlled, without complications ([place])    Diabetes type 2, controlled ([place]) 06/13/2016   Diet controlled    Elevated LDL cholesterol level    Headache    History of low potassium    Hypertension    PMB (postmenopausal bleeding)     Family History  Problem Relation Age of Onset   Arthritis Sister    Diabetes Sister    Hypertension Sister    Cancer Father        unknown    Arthritis Brother    Ulcers Brother    Diabetes Sister    Hypertension Sister    Arthritis Sister    Colon cancer Neg Hx     Past Surgical History:  Procedure Laterality Date   DILATATION & CURETTAGE/HYSTEROSCOPY WITH MYOSURE N/A 09/27/2019   Procedure: [place];  Surgeon: Megan Salon, MD;  Location: [place];  Service: Gynecology;  Laterality: N/A;  to follow first case of AM   INDUCED ABORTION     TUBAL LIGATION     Social History   Occupational History   Not on file  Tobacco Use   Smoking status: Never   Smokeless tobacco: Never  Vaping Use   Vaping Use: Never used  Substance and Sexual Activity   Alcohol use: No   Drug use: No   Sexual activity: Not Currently    Birth control/protection: Post-menopausal

## 2021-11-21 NOTE — Addendum Note (Signed)
Addended by: Azucena Cecil on: 11/21/2021 01:59 PM   Modules accepted: Level of Service

## 2021-12-01 ENCOUNTER — Ambulatory Visit
Admission: RE | Admit: 2021-12-01 | Discharge: 2021-12-01 | Disposition: A | Discharge status: Home / Self Care | Payer: BC Managed Care – PPO | Source: Ambulatory Visit | Attending: Orthopaedic Surgery | Admitting: Orthopaedic Surgery

## 2021-12-01 DIAGNOSIS — G8929 Other chronic pain: Secondary | ICD-10-CM

## 2021-12-03 NOTE — Progress Notes (Signed)
Needs apt to discuss MRI.  Thanks.

## 2021-12-11 ENCOUNTER — Ambulatory Visit: Payer: BC Managed Care – PPO | Admitting: Orthopaedic Surgery

## 2021-12-11 ENCOUNTER — Other Ambulatory Visit: Payer: Self-pay | Admitting: Physician Assistant

## 2021-12-11 ENCOUNTER — Other Ambulatory Visit: Payer: Self-pay | Admitting: Pediatrics

## 2021-12-11 DIAGNOSIS — S46011A Strain of muscle(s) and tendon(s) of the rotator cuff of right shoulder, initial encounter: Secondary | ICD-10-CM

## 2021-12-11 DIAGNOSIS — Z1231 Encounter for screening mammogram for malignant neoplasm of breast: Secondary | ICD-10-CM

## 2021-12-11 NOTE — Progress Notes (Signed)
Office Visit Note   Patient: Holly Hendrix           Date of Birth: [birth year]           MRN: [id number] Visit Date: 12/11/2021              Requested by: Irene Pap, PA-C No address on file PCP: Irene Pap, PA-C   Assessment & Plan: Visit Diagnoses:  1. Traumatic complete tear of right rotator cuff, initial encounter     Plan: MRI reveals a full-thickness supraspinatus tear with retraction as well as a small anterior infraspinatus full-thickness tear.  Moderate biceps tendinopathy.  Based on these findings I have recommended arthroscopic rotator cuff repair and extensive debridement possible biceps tenotomy.  Risk benefits rehab recovery prognosis reviewed with the patient in detail.  Questions encouraged and answered.  Follow-Up Instructions: No follow-ups on file.   Orders:  No orders of the defined types were placed in this encounter.  No orders of the defined types were placed in this encounter.     Procedures: No procedures performed   Clinical Data: No additional findings.   Subjective: Chief Complaint  Patient presents with   Right Shoulder - Pain    HPI Jozlyn is returning today to discuss right shoulder MRI scan.  Still has pain and weakness in the arm.  Review of Systems   Objective: Vital Signs: LMP  (LMP Unknown)   Physical Exam  Ortho Exam Examination of right shoulder shows pain and significant weakness with testing of the superior rotator cuff.  She has pain with Speed test. Specialty Comments:  No specialty comments available.  Imaging: No results found.   PMFS History: Patient Active Problem List   Diagnosis Date Noted   Controlled type 2 diabetes mellitus without complication, without long-term current use of insulin ([place]) 07/26/2021   Hypokalemia 07/26/2021   Lymphocytosis 04/07/2019   Intramural uterine fibroid 02/14/2019   Chronic foot pain, left 03/16/2018   Enlarged thyroid 08/27/2017   Estrogen deficiency  08/27/2017   Obesity (BMI 30-39.9) 06/13/2016   Hyperlipidemia associated with type 2 diabetes mellitus ([place]) 07/29/2013   DENTAL CARIES 10/05/2009   INCONTINENCE, URGE 08/02/2009   HYPERTENSION, BENIGN ESSENTIAL 06/06/2008   ALLERGIC RHINITIS 06/06/2008   Past Medical History:  Diagnosis Date   Diabetes mellitus type 2, controlled, without complications ([place])    Diabetes type 2, controlled ([place]) 06/13/2016   Diet controlled    Elevated LDL cholesterol level    Headache    History of low potassium    Hypertension    PMB (postmenopausal bleeding)     Family History  Problem Relation Age of Onset   Arthritis Sister    Diabetes Sister    Hypertension Sister    Cancer Father        unknown    Arthritis Brother    Ulcers Brother    Diabetes Sister    Hypertension Sister    Arthritis Sister    Colon cancer Neg Hx     Past Surgical History:  Procedure Laterality Date   DILATATION & CURETTAGE/HYSTEROSCOPY WITH MYOSURE N/A 09/27/2019   Procedure: [place];  Surgeon: Megan Salon, MD;  Location: [place];  Service: Gynecology;  Laterality: N/A;  to follow first case of AM   INDUCED ABORTION     TUBAL LIGATION     Social History   Occupational History   Not on file  Tobacco Use  Smoking status: Never   Smokeless tobacco: Never  Vaping Use   Vaping Use: Never used  Substance and Sexual Activity   Alcohol use: No   Drug use: No   Sexual activity: Not Currently    Birth control/protection: Post-menopausal

## 2021-12-17 ENCOUNTER — Telehealth: Payer: Self-pay | Admitting: Orthopaedic Surgery

## 2021-12-17 NOTE — Telephone Encounter (Signed)
FYI:   Patient cancelled surgery at [facility] on 12-27-21.  She is unable to pay $2736 upfront for shoulder surgery.  Patient has chosen a date of 02-13-22 at [facility].

## 2022-01-01 ENCOUNTER — Ambulatory Visit: Payer: BC Managed Care – PPO | Admitting: Physician Assistant

## 2022-01-02 ENCOUNTER — Ambulatory Visit: Payer: BC Managed Care – PPO | Admitting: Family Medicine

## 2022-01-03 ENCOUNTER — Encounter: Payer: BC Managed Care – PPO | Admitting: Physician Assistant

## 2022-01-08 ENCOUNTER — Ambulatory Visit: Payer: BC Managed Care – PPO

## 2022-01-16 ENCOUNTER — Ambulatory Visit
Admission: RE | Admit: 2022-01-16 | Discharge: 2022-01-16 | Disposition: A | Discharge status: Home / Self Care | Payer: BC Managed Care – PPO | Source: Ambulatory Visit | Attending: Physician Assistant | Admitting: Physician Assistant

## 2022-01-16 DIAGNOSIS — Z1231 Encounter for screening mammogram for malignant neoplasm of breast: Secondary | ICD-10-CM

## 2022-02-06 ENCOUNTER — Other Ambulatory Visit: Payer: Self-pay

## 2022-02-06 ENCOUNTER — Encounter (HOSPITAL_BASED_OUTPATIENT_CLINIC_OR_DEPARTMENT_OTHER): Payer: Self-pay | Admitting: Orthopaedic Surgery

## 2022-02-08 ENCOUNTER — Encounter (HOSPITAL_BASED_OUTPATIENT_CLINIC_OR_DEPARTMENT_OTHER)
Admission: RE | Admit: 2022-02-08 | Discharge: 2022-02-08 | Disposition: A | Discharge status: Home / Self Care | Payer: BC Managed Care – PPO | Source: Ambulatory Visit | Attending: Orthopaedic Surgery | Admitting: Orthopaedic Surgery

## 2022-02-08 DIAGNOSIS — Z01818 Encounter for other preprocedural examination: Secondary | ICD-10-CM | POA: Diagnosis present

## 2022-02-08 DIAGNOSIS — E119 Type 2 diabetes mellitus without complications: Secondary | ICD-10-CM | POA: Insufficient documentation

## 2022-02-08 LAB — BASIC METABOLIC PANEL
Anion gap: 11 (ref 5–15)
BUN: 13 mg/dL (ref 8–23)
CO2: 26 mmol/L (ref 22–32)
Calcium: 9.4 mg/dL (ref 8.9–10.3)
Chloride: 103 mmol/L (ref 98–111)
Creatinine, Ser: 0.78 mg/dL (ref 0.44–1.00)
GFR, Estimated: >60 mL/min (ref >60)
Glucose, Bld: 101 mg/dL — ABNORMAL HIGH (ref 70–99)
Potassium: 3.8 mmol/L (ref 3.5–5.1)
Sodium: 140 mmol/L (ref 135–145)

## 2022-02-08 NOTE — Progress Notes (Signed)

## 2022-02-12 ENCOUNTER — Other Ambulatory Visit: Payer: Self-pay | Admitting: Physician Assistant

## 2022-02-12 DIAGNOSIS — E876 Hypokalemia: Secondary | ICD-10-CM

## 2022-02-12 DIAGNOSIS — E1169 Type 2 diabetes mellitus with other specified complication: Secondary | ICD-10-CM

## 2022-02-12 DIAGNOSIS — I10 Essential (primary) hypertension: Secondary | ICD-10-CM

## 2022-02-12 DIAGNOSIS — E119 Type 2 diabetes mellitus without complications: Secondary | ICD-10-CM

## 2022-02-13 ENCOUNTER — Ambulatory Visit (HOSPITAL_BASED_OUTPATIENT_CLINIC_OR_DEPARTMENT_OTHER): Payer: BC Managed Care – PPO | Admitting: Anesthesiology

## 2022-02-13 ENCOUNTER — Ambulatory Visit (HOSPITAL_BASED_OUTPATIENT_CLINIC_OR_DEPARTMENT_OTHER)
Admission: RE | Admit: 2022-02-13 | Discharge: 2022-02-13 | Disposition: A | Discharge status: Home / Self Care | Payer: BC Managed Care – PPO | Attending: Orthopaedic Surgery | Admitting: Orthopaedic Surgery

## 2022-02-13 ENCOUNTER — Encounter (HOSPITAL_BASED_OUTPATIENT_CLINIC_OR_DEPARTMENT_OTHER): Payer: Self-pay | Admitting: Orthopaedic Surgery

## 2022-02-13 ENCOUNTER — Encounter (HOSPITAL_BASED_OUTPATIENT_CLINIC_OR_DEPARTMENT_OTHER)
Admission: RE | Disposition: A | Discharge status: Home / Self Care | Payer: Self-pay | Source: Home / Self Care | Attending: Orthopaedic Surgery

## 2022-02-13 ENCOUNTER — Encounter: Payer: Self-pay | Admitting: Orthopaedic Surgery

## 2022-02-13 ENCOUNTER — Other Ambulatory Visit: Payer: Self-pay

## 2022-02-13 DIAGNOSIS — M25811 Other specified joint disorders, right shoulder: Secondary | ICD-10-CM | POA: Insufficient documentation

## 2022-02-13 DIAGNOSIS — M75121 Complete rotator cuff tear or rupture of right shoulder, not specified as traumatic: Secondary | ICD-10-CM | POA: Diagnosis not present

## 2022-02-13 DIAGNOSIS — M19011 Primary osteoarthritis, right shoulder: Secondary | ICD-10-CM | POA: Diagnosis not present

## 2022-02-13 DIAGNOSIS — X58XXXA Exposure to other specified factors, initial encounter: Secondary | ICD-10-CM | POA: Diagnosis not present

## 2022-02-13 DIAGNOSIS — S46011A Strain of muscle(s) and tendon(s) of the rotator cuff of right shoulder, initial encounter: Secondary | ICD-10-CM | POA: Diagnosis not present

## 2022-02-13 DIAGNOSIS — E119 Type 2 diabetes mellitus without complications: Secondary | ICD-10-CM

## 2022-02-13 DIAGNOSIS — S46211A Strain of muscle, fascia and tendon of other parts of biceps, right arm, initial encounter: Secondary | ICD-10-CM | POA: Insufficient documentation

## 2022-02-13 HISTORY — DX: Hyperlipidemia, unspecified: E78.5

## 2022-02-13 HISTORY — PX: SHOULDER ARTHROSCOPY WITH ROTATOR CUFF REPAIR AND SUBACROMIAL DECOMPRESSION: SHX5686

## 2022-02-13 HISTORY — DX: Complete rotator cuff tear or rupture of right shoulder, not specified as traumatic: M75.121

## 2022-02-13 HISTORY — DX: Unspecified disorder of synovium and tendon, right shoulder: M67.911

## 2022-02-13 LAB — GLUCOSE, CAPILLARY
Glucose-Capillary: 91 mg/dL (ref 70–99)
Glucose-Capillary: 103 mg/dL — ABNORMAL HIGH (ref 70–99)

## 2022-02-13 SURGERY — SHOULDER ARTHROSCOPY WITH ROTATOR CUFF REPAIR AND SUBACROMIAL DECOMPRESSION
Anesthesia: General | Site: Shoulder | Laterality: Right

## 2022-02-13 MED ORDER — FENTANYL CITRATE (PF) 100 MCG/2ML IJ SOLN: 25.0000 ug | INTRAMUSCULAR | Status: DC | PRN

## 2022-02-13 MED ORDER — CEFAZOLIN SODIUM-DEXTROSE 2-4 GM/100ML-% IV SOLN
INTRAVENOUS | Status: AC
  Filled 2022-02-13: qty 100

## 2022-02-13 MED ORDER — SUGAMMADEX SODIUM 200 MG/2ML IV SOLN
INTRAVENOUS | Status: DC | PRN
  Administered 2022-02-13: 200 mg via INTRAVENOUS

## 2022-02-13 MED ORDER — LIDOCAINE HCL (PF) 1 % IJ SOLN
INTRAMUSCULAR | Status: AC
  Filled 2022-02-13: qty 30

## 2022-02-13 MED ORDER — BUPIVACAINE HCL (PF) 0.5 % IJ SOLN
INTRAMUSCULAR | Status: DC | PRN
  Administered 2022-02-13: 10 mL via PERINEURAL

## 2022-02-13 MED ORDER — PROPOFOL 10 MG/ML IV BOLUS
INTRAVENOUS | Status: AC
  Filled 2022-02-13: qty 20

## 2022-02-13 MED ORDER — DEXAMETHASONE SODIUM PHOSPHATE 10 MG/ML IJ SOLN
INTRAMUSCULAR | Status: AC
  Filled 2022-02-13: qty 1

## 2022-02-13 MED ORDER — FENTANYL CITRATE (PF) 100 MCG/2ML IJ SOLN
INTRAMUSCULAR | Status: AC
  Filled 2022-02-13: qty 2

## 2022-02-13 MED ORDER — PROPOFOL 10 MG/ML IV BOLUS
INTRAVENOUS | Status: DC | PRN
  Administered 2022-02-13: 140 mg via INTRAVENOUS

## 2022-02-13 MED ORDER — ONDANSETRON HCL 4 MG PO TABS: 4.0000 mg | ORAL_TABLET | Freq: Three times a day (TID) | ORAL | 0 refills | Status: DC | PRN

## 2022-02-13 MED ORDER — LIDOCAINE HCL (CARDIAC) PF 100 MG/5ML IV SOSY
PREFILLED_SYRINGE | INTRAVENOUS | Status: DC | PRN
  Administered 2022-02-13: 80 mg via INTRAVENOUS

## 2022-02-13 MED ORDER — OXYCODONE-ACETAMINOPHEN 5-325 MG PO TABS: 1.0000 | ORAL_TABLET | Freq: Two times a day (BID) | ORAL | 0 refills | Status: DC | PRN

## 2022-02-13 MED ORDER — MIDAZOLAM HCL 2 MG/2ML IJ SOLN
INTRAMUSCULAR | Status: AC
  Filled 2022-02-13: qty 2

## 2022-02-13 MED ORDER — LACTATED RINGERS IV SOLN: INTRAVENOUS | Status: DC

## 2022-02-13 MED ORDER — ONDANSETRON HCL 4 MG/2ML IJ SOLN
INTRAMUSCULAR | Status: DC | PRN
  Administered 2022-02-13: 4 mg via INTRAVENOUS

## 2022-02-13 MED ORDER — SODIUM CHLORIDE 0.9 % IR SOLN
Status: DC | PRN
  Administered 2022-02-13: 7000 mL

## 2022-02-13 MED ORDER — PHENYLEPHRINE HCL (PRESSORS) 10 MG/ML IV SOLN
INTRAVENOUS | Status: AC
  Filled 2022-02-13: qty 1

## 2022-02-13 MED ORDER — FENTANYL CITRATE (PF) 100 MCG/2ML IJ SOLN
INTRAMUSCULAR | Status: DC | PRN
  Administered 2022-02-13: 50 ug via INTRAVENOUS

## 2022-02-13 MED ORDER — LIDOCAINE 2% (20 MG/ML) 5 ML SYRINGE
INTRAMUSCULAR | Status: AC
  Filled 2022-02-13: qty 5

## 2022-02-13 MED ORDER — DEXAMETHASONE SODIUM PHOSPHATE 4 MG/ML IJ SOLN
INTRAMUSCULAR | Status: DC | PRN
  Administered 2022-02-13: 10 mg via INTRAVENOUS

## 2022-02-13 MED ORDER — ONDANSETRON HCL 4 MG/2ML IJ SOLN: 4.0000 mg | Freq: Once | INTRAMUSCULAR | Status: DC | PRN

## 2022-02-13 MED ORDER — BUPIVACAINE LIPOSOME 1.3 % IJ SUSP
INTRAMUSCULAR | Status: DC | PRN
  Administered 2022-02-13: 10 mL via PERINEURAL

## 2022-02-13 MED ORDER — ROCURONIUM BROMIDE 100 MG/10ML IV SOLN
INTRAVENOUS | Status: DC | PRN
  Administered 2022-02-13: 60 mg via INTRAVENOUS

## 2022-02-13 MED ORDER — FENTANYL CITRATE (PF) 100 MCG/2ML IJ SOLN
100.0000 ug | Freq: Once | INTRAMUSCULAR | Status: AC
  Administered 2022-02-13: 50 ug via INTRAVENOUS

## 2022-02-13 MED ORDER — BUPIVACAINE HCL (PF) 0.5 % IJ SOLN
INTRAMUSCULAR | Status: AC
  Filled 2022-02-13: qty 30

## 2022-02-13 MED ORDER — ACETAMINOPHEN 500 MG PO TABS: 1000.0000 mg | ORAL_TABLET | Freq: Once | ORAL | Status: DC

## 2022-02-13 MED ORDER — CEFAZOLIN SODIUM-DEXTROSE 2-4 GM/100ML-% IV SOLN
2.0000 g | INTRAVENOUS | Status: AC
  Administered 2022-02-13: 2 g via INTRAVENOUS

## 2022-02-13 MED ORDER — MIDAZOLAM HCL 2 MG/2ML IJ SOLN
2.0000 mg | Freq: Once | INTRAMUSCULAR | Status: AC
  Administered 2022-02-13: 2 mg via INTRAVENOUS

## 2022-02-13 MED ORDER — BUPIVACAINE-EPINEPHRINE (PF) 0.25% -1:200000 IJ SOLN
INTRAMUSCULAR | Status: AC
  Filled 2022-02-13: qty 30

## 2022-02-13 MED ORDER — PHENYLEPHRINE HCL-NACL 20-0.9 MG/250ML-% IV SOLN
INTRAVENOUS | Status: DC | PRN
  Administered 2022-02-13: 25 ug/min via INTRAVENOUS

## 2022-02-13 MED ORDER — ONDANSETRON HCL 4 MG/2ML IJ SOLN
INTRAMUSCULAR | Status: AC
  Filled 2022-02-13: qty 2

## 2022-02-13 MED ORDER — ROCURONIUM BROMIDE 10 MG/ML (PF) SYRINGE
PREFILLED_SYRINGE | INTRAVENOUS | Status: AC
  Filled 2022-02-13: qty 10

## 2022-02-13 SURGICAL SUPPLY — 57 items
APL SKNCLS STERI-STRIP NONHPOA (GAUZE/BANDAGES/DRESSINGS)
BENZOIN TINCTURE PRP APPL 2/3 (GAUZE/BANDAGES/DRESSINGS) IMPLANT
BLADE EXCALIBUR 4.0X13 (MISCELLANEOUS) IMPLANT
BLADE SURG 15 STRL LF DISP TIS (BLADE) IMPLANT
BLADE SURG 15 STRL SS (BLADE)
BURR OVAL 8 FLU 4.0X13 (MISCELLANEOUS) ×2 IMPLANT
CANNULA 5.75X71 LONG (CANNULA) ×2 IMPLANT
CANNULA SHOULDER 7CM (CANNULA) IMPLANT
CANNULA TWIST IN 8.25X7CM (CANNULA) IMPLANT
CLSR STERI-STRIP ANTIMIC 1/2X4 (GAUZE/BANDAGES/DRESSINGS) IMPLANT
COOLER ICEMAN CLASSIC (MISCELLANEOUS) ×2 IMPLANT
DISSECTOR  3.8MM X 13CM (MISCELLANEOUS) ×1
DISSECTOR 3.8MM X 13CM (MISCELLANEOUS) ×2 IMPLANT
DRAPE IMP U-DRAPE 54X76 (DRAPES) ×2 IMPLANT
DRAPE INCISE IOBAN 66X45 STRL (DRAPES) ×2 IMPLANT
DRAPE POUCH INSTRU U-SHP 10X18 (DRAPES) ×2 IMPLANT
DRAPE SHOULDER BEACH CHAIR (DRAPES) ×2 IMPLANT
DRAPE STERI 35X30 U-POUCH (DRAPES) ×2 IMPLANT
DRAPE U-SHAPE 47X51 STRL (DRAPES) ×4 IMPLANT
DURAPREP 26ML APPLICATOR (WOUND CARE) ×4 IMPLANT
ELECT REM PT RETURN 9FT ADLT (ELECTROSURGICAL)
ELECTRODE REM PT RTRN 9FT ADLT (ELECTROSURGICAL) IMPLANT
GAUZE PAD ABD 8X10 STRL (GAUZE/BANDAGES/DRESSINGS) ×4 IMPLANT
GAUZE SPONGE 4X4 12PLY STRL (GAUZE/BANDAGES/DRESSINGS) ×4 IMPLANT
GAUZE XEROFORM 1X8 LF (GAUZE/BANDAGES/DRESSINGS) ×2 IMPLANT
GLOVE ECLIPSE 7.0 STRL STRAW (GLOVE) ×4 IMPLANT
GLOVE INDICATOR 7.0 STRL GRN (GLOVE) ×2 IMPLANT
GLOVE INDICATOR 7.5 STRL GRN (GLOVE) ×2 IMPLANT
GLOVE SURG SYN 7.5  E (GLOVE) ×1
GLOVE SURG SYN 7.5 E (GLOVE) ×1 IMPLANT
GLOVE SURG SYN 7.5 PF PI (GLOVE) ×2 IMPLANT
GOWN STRL REIN XL XLG (GOWN DISPOSABLE) ×4 IMPLANT
GOWN STRL REUS W/ TWL LRG LVL3 (GOWN DISPOSABLE) ×2 IMPLANT
GOWN STRL REUS W/TWL LRG LVL3 (GOWN DISPOSABLE) ×1
MANIFOLD NEPTUNE II (INSTRUMENTS) ×2 IMPLANT
NDL SCORPION MULTI FIRE (NEEDLE) IMPLANT
NEEDLE SCORPION MULTI FIRE (NEEDLE) IMPLANT
PACK ARTHROSCOPY DSU (CUSTOM PROCEDURE TRAY) ×2 IMPLANT
PACK BASIN DAY SURGERY FS (CUSTOM PROCEDURE TRAY) ×2 IMPLANT
PAD COLD SHLDR WRAP-ON (PAD) ×2 IMPLANT
PORT APPOLLO RF 90DEGREE MULTI (SURGICAL WAND) ×2 IMPLANT
SHEET MEDIUM DRAPE 40X70 STRL (DRAPES) IMPLANT
SLEEVE SCD COMPRESS KNEE MED (STOCKING) ×2 IMPLANT
SLING ARM FOAM STRAP LRG (SOFTGOODS) IMPLANT
SPIKE FLUID TRANSFER (MISCELLANEOUS) IMPLANT
SUT ETHILON 3 0 PS 1 (SUTURE) ×2 IMPLANT
SUT FIBERWIRE #2 38 T-5 BLUE (SUTURE)
SUT TIGER TAPE 7 IN WHITE (SUTURE) IMPLANT
SUTURE FIBERWR #2 38 T-5 BLUE (SUTURE) IMPLANT
SUTURE TAPE 1.3 40 TPR END (SUTURE) IMPLANT
SUTURE TAPE TIGERLINK 1.3MM BL (SUTURE) IMPLANT
SUTURETAPE 1.3 40 TPR END (SUTURE)
SUTURETAPE TIGERLINK 1.3MM BL (SUTURE)
TAPE FIBER 2MM 7IN #2 BLUE (SUTURE) IMPLANT
TOWEL GREEN STERILE FF (TOWEL DISPOSABLE) ×2 IMPLANT
TUBE CONNECTING 20X1/4 (TUBING) ×2 IMPLANT
TUBING ARTHROSCOPY IRRIG 16FT (MISCELLANEOUS) ×2 IMPLANT

## 2022-02-13 NOTE — Anesthesia Procedure Notes (Signed)
Anesthesia Regional Block: Interscalene brachial plexus block   Pre-Anesthetic Checklist: , timeout performed,  Correct Patient, Correct Site, Correct Laterality,  Correct Procedure, Correct Position, site marked,  Risks and benefits discussed,  Surgical consent,  Pre-op evaluation,  At surgeon's request and post-op pain management  Laterality: Right  Prep: chloraprep       Needles:  Injection technique: Single-shot  Needle Type: Echogenic Needle     Needle Length: 9cm  Needle Gauge: 21     Additional Needles:   Procedures:,,,, ultrasound used (permanent image in chart),,    Narrative:  Start time: 02/13/2022 11:30 AM End time: 02/13/2022 11:36 AM Injection made incrementally with aspirations every 5 mL.  Performed by: Personally  Anesthesiologist: Santa Lighter, MD  Additional Notes: No pain on injection. No increased resistance to injection. Injection made in 5cc increments.  Good needle visualization.  Patient tolerated procedure well.

## 2022-02-13 NOTE — H&P (Signed)
PREOPERATIVE H&P  Chief Complaint: right rotator cuff tear  HPI: Holly Hendrix is a 61 y.o. female who presents for surgical treatment of right rotator cuff tear.  She denies any changes in medical history.  Past Medical History:  Diagnosis Date   Diabetes mellitus type 2, controlled, without complications ([place])    Diabetes type 2, controlled ([place]) 06/13/2016   Diet controlled    Elevated LDL cholesterol level    Headache    History of low potassium    Hyperlipidemia    Hypertension    PMB (postmenopausal bleeding)    Rotator cuff disorder, right    Past Surgical History:  Procedure Laterality Date   DILATATION & CURETTAGE/HYSTEROSCOPY WITH MYOSURE N/A 09/27/2019   Procedure: [place];  Surgeon: Megan Salon, MD;  Location: [facility];  Service: Gynecology;  Laterality: N/A;  to follow first case of AM   INDUCED ABORTION     TUBAL LIGATION     Social History   Socioeconomic History   Marital status: Divorced    Spouse name: Not on file   Number of children: Not on file   Years of education: Not on file   Highest education level: Not on file  Occupational History   Not on file  Tobacco Use   Smoking status: Never   Smokeless tobacco: Never  Vaping Use   Vaping Use: Never used  Substance and Sexual Activity   Alcohol use: No   Drug use: No   Sexual activity: Not Currently    Birth control/protection: Post-menopausal, Surgical    Comment: BTL  Other Topics Concern   Not on file  Social History Narrative   Not on file   Social Determinants of Health   Financial Resource Strain: Not on file  Food Insecurity: Not on file  Transportation Needs: Not on file  Physical Activity: Not on file  Stress: Not on file  Social Connections: Not on file   Family History  Problem Relation Age of Onset   Arthritis Sister    Diabetes Sister    Hypertension Sister    Cancer Father        unknown    Arthritis  Brother    Ulcers Brother    Diabetes Sister    Hypertension Sister    Arthritis Sister    Colon cancer Neg Hx    No Known Allergies Prior to Admission medications   Medication Sig Start Date End Date Taking? Authorizing Provider  Acetaminophen (TYLENOL PO) Take 500 mg by mouth. Takes 2 tabs   Yes [provider]  atorvastatin (LIPITOR) 20 MG tablet Take 1 tablet (20 mg total) by mouth daily. 07/26/21  Yes Francis Gaines B, PA-C  Liniments (ICE MENTHOL EX) Apply topically.   Yes [provider]  lisinopril-hydrochlorothiazide (ZESTORETIC) 20-25 MG tablet Take 1 tablet by mouth daily. 07/26/21  Yes Francis Gaines B, PA-C  metFORMIN (GLUCOPHAGE) 500 MG tablet Take 1 tablet (500 mg total) by mouth daily with breakfast. 07/26/21  Yes Francis Gaines B, PA-C  potassium chloride SA (KLOR-CON M) 20 MEQ tablet Take 1 tablet by mouth once daily 07/26/21  Yes Francis Gaines B, PA-C     Positive ROS: All other systems have been reviewed and were otherwise negative with the exception of those mentioned in the HPI and as above.  Physical Exam: General: Alert, no acute distress Cardiovascular: No pedal edema Respiratory: No cyanosis, no use of accessory musculature GI:  abdomen soft Skin: No lesions in the area of chief complaint Neurologic: Sensation intact distally Psychiatric: Patient is competent for consent with normal mood and affect Lymphatic: no lymphedema  MUSCULOSKELETAL: exam stable  Assessment: right rotator cuff tear  Plan: Plan for Procedure(s): RIGHT SHOULDER ROTATOR CUFF REPAIR, EXTENSIVE DEBRIDEMENT  The risks benefits and alternatives were discussed with the patient including but not limited to the risks of nonoperative treatment, versus surgical intervention including infection, bleeding, nerve injury,  blood clots, cardiopulmonary complications, morbidity, mortality, among others, and they were willing to proceed.   Eduard Roux, MD 02/13/2022 10:55 AM

## 2022-02-13 NOTE — Anesthesia Procedure Notes (Signed)
Procedure Name: Intubation Date/Time: 02/13/2022 12:28 PM  Performed by: Glory Buff, CRNAPre-anesthesia Checklist: Patient identified, Emergency Drugs available, Suction available and Patient being monitored Patient Re-evaluated:Patient Re-evaluated prior to induction Oxygen Delivery Method: Circle system utilized Preoxygenation: Pre-oxygenation with 100% oxygen Induction Type: IV induction Ventilation: Mask ventilation without difficulty Laryngoscope Size: Miller and 3 Grade View: Grade I Tube type: Oral Tube size: 7.0 mm Number of attempts: 1 Airway Equipment and Method: Stylet and Oral airway Placement Confirmation: ETT inserted through vocal cords under direct vision, positive ETCO2 and breath sounds checked- equal and bilateral Secured at: 20 cm Tube secured with: Tape Dental Injury: Teeth and Oropharynx as per pre-operative assessment

## 2022-02-13 NOTE — Anesthesia Preprocedure Evaluation (Addendum)
Anesthesia Evaluation  Patient identified by MRN, date of birth, ID band Patient awake    Reviewed: Allergy & Precautions, NPO status , Patient's Chart, lab work & pertinent test results  History of Anesthesia Complications Negative for: history of anesthetic complications  Airway Mallampati: III  TM Distance: >3 FB Neck ROM: Full    Dental  (+) Dental Advisory Given, Missing, Loose,    Pulmonary neg pulmonary ROS,    Pulmonary exam normal breath sounds clear to auscultation       Cardiovascular hypertension, Pt. on medications Normal cardiovascular exam Rhythm:Regular Rate:Normal     Neuro/Psych  Headaches, negative psych ROS   GI/Hepatic negative GI ROS, Neg liver ROS,   Endo/Other  diabetes, Well Controlled, Type 2, Oral Hypoglycemic AgentsObesity   Renal/GU negative Renal ROS     Musculoskeletal Rotator cuff disorder, right   Abdominal   Peds  Hematology negative hematology ROS (+)   Anesthesia Other Findings Day of surgery medications reviewed with the patient.  Reproductive/Obstetrics                            Anesthesia Physical Anesthesia Plan  ASA: 2  Anesthesia Plan: General   Post-op Pain Management: Regional block* and Tylenol PO (pre-op)*   Induction: Intravenous  PONV Risk Score and Plan: 3 and Midazolam, Dexamethasone and Ondansetron  Airway Management Planned: Oral ETT  Additional Equipment:   Intra-op Plan:   Post-operative Plan: Extubation in OR  Informed Consent: I have reviewed the patients History and Physical, chart, labs and discussed the procedure including the risks, benefits and alternatives for the proposed anesthesia with the patient or authorized representative who has indicated his/her understanding and acceptance.     Dental advisory given  Plan Discussed with: CRNA  Anesthesia Plan Comments:         Anesthesia Quick Evaluation

## 2022-02-13 NOTE — Discharge Instructions (Addendum)
Post-operative patient instructions  Shoulder Arthroscopy   Ice:  Place intermittent ice or cooler pack over your shoulder, 30 minutes on and 30 minutes off.  Continue this for the first 72 hours after surgery, then save ice for use after therapy sessions or on more active days.   Weight:  You may bear weight on your arm as your symptoms allow. Motion:  Perform gentle shoulder motion as tolerated Dressing:  Perform 1st dressing change at 2 days postoperative. A moderate amount of blood tinged drainage is to be expected.  So if you bleed through the dressing on the first or second day or if you have fevers, it is fine to change the dressing/check the wounds early and redress wound.  If it bleeds through again, or if the incisions are leaking frank blood, please call the office. May change dressing every 1-2 days thereafter to help watch wounds. Can purchase Tegaderm (or 48M Nexcare) water resistant dressings at local pharmacy / [employer]. Shower:  Light shower is ok after 2 days.  Please take shower, NO bath. Recover with gauze and ace wrap to help keep wounds protected.   Pain medication:  A narcotic pain medication has been prescribed.  Take as directed.  Typically you need narcotic pain medication more regularly during the first 3 to 5 days after surgery.  Decrease your use of the medication as the pain improves.  Narcotics can sometimes cause constipation, even after a few doses.  If you have problems with constipation, you can take an over the counter stool softener or light laxative.  If you have persistent problems, please notify your physician's office. Physical therapy: Additional activity guidelines to be provided by your physician or physical therapist at follow-up visits.  Driving: Do not recommend driving x 2 weeks post surgical, especially if surgery performed on right side. Should not drive while taking narcotic pain medications. It typically takes at least 2 weeks to restore sufficient  neuromuscular function for normal reaction times for driving safety.  Call [phone] for questions or problems. Evenings you will be forwarded to the hospital operator.  Ask for the orthopaedic physician on call. Please call if you experience:    Redness, foul smelling, or persistent drainage from the surgical site  worsening shoulder pain and swelling not responsive to medication  any calf pain and or swelling of the lower leg  temperatures greater than 101.5 F other questions or concerns   Thank you for allowing [place] to be a part of your care.    Post Anesthesia Home Care Instructions  Activity: Get plenty of rest for the remainder of the day. A responsible individual must stay with you for 24 hours following the procedure.  For the next 24 hours, DO NOT: -Drive a car -Paediatric nurse -Drink alcoholic beverages -Take any medication unless instructed by your physician -Make any legal decisions or sign important papers.  Meals: Start with liquid foods such as gelatin or soup. Progress to regular foods as tolerated. Avoid greasy, spicy, heavy foods. If nausea and/or vomiting occur, drink only clear liquids until the nausea and/or vomiting subsides. Call your physician if vomiting continues.  Special Instructions/Symptoms: Your throat may feel dry or sore from the anesthesia or the breathing tube placed in your throat during surgery. If this causes discomfort, gargle with warm salt water. The discomfort should disappear within 24 hours.  If you had a scopolamine patch placed behind your ear for the management of post- operative nausea and/or vomiting:  1. The medication in the patch is effective for 72 hours, after which it should be removed.  Wrap patch in a tissue and discard in the trash. Wash hands thoroughly with soap and water. 2. You may remove the patch earlier than 72 hours if you experience unpleasant side effects which may include dry mouth, dizziness or visual  disturbances. 3. Avoid touching the patch. Wash your hands with soap and water after contact with the patch.      Regional Anesthesia Blocks  1. Numbness or the inability to move the "blocked" extremity may last from 3-48 hours after placement. The length of time depends on the medication injected and your individual response to the medication. If the numbness is not going away after 48 hours, call your surgeon.  2. The extremity that is blocked will need to be protected until the numbness is gone and the  Strength has returned. Because you cannot feel it, you will need to take extra care to avoid injury. Because it may be weak, you may have difficulty moving it or using it. You may not know what position it is in without looking at it while the block is in effect.  3. For blocks in the legs and feet, returning to weight bearing and walking needs to be done carefully. You will need to wait until the numbness is entirely gone and the strength has returned. You should be able to move your leg and foot normally before you try and bear weight or walk. You will need someone to be with you when you first try to ensure you do not fall and possibly risk injury.  4. Bruising and tenderness at the needle site are common side effects and will resolve in a few days.  5. Persistent numbness or new problems with movement should be communicated to the surgeon or the [place] [phone] [place] [phone]).    Post Anesthesia Home Care Instructions  Activity: Get plenty of rest for the remainder of the day. A responsible individual must stay with you for 24 hours following the procedure.  For the next 24 hours, DO NOT: -Drive a car -Paediatric nurse -Drink alcoholic beverages -Take any medication unless instructed by your physician -Make any legal decisions or sign important papers.  Meals: Start with liquid foods such as gelatin or soup. Progress to regular  foods as tolerated. Avoid greasy, spicy, heavy foods. If nausea and/or vomiting occur, drink only clear liquids until the nausea and/or vomiting subsides. Call your physician if vomiting continues.  Special Instructions/Symptoms: Your throat may feel dry or sore from the anesthesia or the breathing tube placed in your throat during surgery. If this causes discomfort, gargle with warm salt water. The discomfort should disappear within 24 hours.  If you had a scopolamine patch placed behind your ear for the management of post- operative nausea and/or vomiting:  1. The medication in the patch is effective for 72 hours, after which it should be removed.  Wrap patch in a tissue and discard in the trash. Wash hands thoroughly with soap and water. 2. You may remove the patch earlier than 72 hours if you experience unpleasant side effects which may include dry mouth, dizziness or visual disturbances. 3. Avoid touching the patch. Wash your hands with soap and water after contact with the patch.  Regional Anesthesia Blocks  1. Numbness or the inability to move the "blocked" extremity may last from 3-48 hours after placement. The length of time  depends on the medication injected and your individual response to the medication. If the numbness is not going away after 48 hours, call your surgeon.  2. The extremity that is blocked will need to be protected until the numbness is gone and the  Strength has returned. Because you cannot feel it, you will need to take extra care to avoid injury. Because it may be weak, you may have difficulty moving it or using it. You may not know what position it is in without looking at it while the block is in effect.  3. For blocks in the legs and feet, returning to weight bearing and walking needs to be done carefully. You will need to wait until the numbness is entirely gone and the strength has returned. You should be able to move your leg and foot normally before you try and  bear weight or walk. You will need someone to be with you when you first try to ensure you do not fall and possibly risk injury.  4. Bruising and tenderness at the needle site are common side effects and will resolve in a few days.  5. Persistent numbness or new problems with movement should be communicated to the surgeon or the [place] [phone] [place] [phone]).

## 2022-02-13 NOTE — Anesthesia Postprocedure Evaluation (Signed)
Anesthesia Post Note  Patient: Holly Hendrix  Procedure(s) Performed: RIGHT SHOULDER ROTATOR CUFF REPAIR, EXTENSIVE DEBRIDEMENT (Right: Shoulder)     Patient location during evaluation: PACU Anesthesia Type: General Level of consciousness: awake and alert Pain management: pain level controlled Vital Signs Assessment: post-procedure vital signs reviewed and stable Respiratory status: spontaneous breathing, nonlabored ventilation, respiratory function stable and patient connected to nasal cannula oxygen Cardiovascular status: blood pressure returned to baseline and stable Postop Assessment: no apparent nausea or vomiting Anesthetic complications: no   No notable events documented.  Last Vitals:  Vitals:   02/13/22 1430 02/13/22 1445  BP: (!) 144/89 (!) 147/90  Pulse: 68 73  Resp: 19 20  Temp:  36.4 C  SpO2: 93% 95%    Last Pain:  Vitals:   02/13/22 1445  TempSrc:   PainSc: [place] Deniya Craigo

## 2022-02-13 NOTE — Op Note (Signed)
Date of Surgery: 02/13/2022  INDICATIONS: The patient is a 61 year old female with right shoulder pain that has failed conservative treatment;  The patient did consent to the procedure after discussion of the risks and benefits.  DIAGNOSES: Right shoulder, acute on chronic rotator cuff tear. 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 Eduard Roux, M.D.  ASSIST: Madalyn Rob, PA-C  ANESTHESIA:  general, regional  IV FLUIDS AND URINE: See anesthesia.  ESTIMATED BLOOD LOSS: minimal mL.  IMPLANTS: None  COMPLICATIONS: None.  DESCRIPTION OF PROCEDURE: The patient was brought to the operating room and placed supine on the operating table.  The patient had been signed prior to the procedure and this was documented. The patient had the anesthesia placed by the anesthesiologist.  A time-out was performed to confirm that this was the correct patient, site, side and location. The patient did receive antibiotics prior to the incision and was re-dosed during the procedure as needed at indicated intervals.  The patient was then positioned into the beach chair position with all bony prominences well padded and neutral C spine. The patient had the operative extremity prepped and draped in the standard surgical fashion.    Incisions were made for shoulder arthroscopy  portals.  Diagnostic shoulder arthroscopy ensued.  There was widespread erythema and synovitis.  Synovectomy was performed.  Hemostasis was obtained.  Subscapularis tendon was unremarkable.  The chondral surfaces were relatively unremarkable.  The long head of the biceps tendon was severely torn along its substance and at this labral bicipital complex.  Biceps tenotomy was performed.  The stump of the superior labrum was debrided down to stable margins.  The anterior labrum was mildly degenerative and this was debrided.  We are able to see the full-thickness retracted tear from the joint.  The retraction was back to the level of the glenoid.  The articular surface was gently debrided back to stable margins.  The arthroscope was then repositioned into the subacromial space.  Unfavorable acromial anatomy was encountered.  Acromioplasty and subacromial decompression was performed.  Bursectomy was performed.  There was a large inferior osteophyte emanating from the distal clavicle.  Distal clavicle excision was performed with a bur.  We then turned our attention to the rotator cuff tear.  There was a significant amount of erythema of the superior cuff which was mainly the supraspinatus and the anterior portion of the infraspinatus.  Soft tissue releases were performed.  Rotator interval was released and despite appropriate releases I could not achieve enough excursion of the cuff back to the humeral head in order to perform a repair.  I could only get it to about 5 mm past the glenoid.  I was not able to get it back to the articular margin.  I therefore made the decision not to perform the repair as this had a high likelihood of failure.  The excess fluid was then expressed from the shoulder joint.  Incisions closed with nylon.  Sterile dressings were applied.  Shoulder sling was placed.  Patient tolerated  procedure well had no many complications. Tawanna Cooler, my PA, was a medical necessity for the entirety of the  surgery including opening, closing, limb positioning, retracting, exposing, and repairing.  POSTOPERATIVE PLAN: Patient will follow-up in the office in 1 week for suture removal.  We will begin physical therapy at that time.  Azucena Cecil, MD [facility] 1:22 PM

## 2022-02-13 NOTE — Progress Notes (Signed)
Assisted Dr. Turk with right, interscalene , ultrasound guided block. Side rails up, monitors on throughout procedure. See vital signs in flow sheet. Tolerated Procedure well. 

## 2022-02-13 NOTE — Transfer of Care (Signed)
Immediate Anesthesia Transfer of Care Note  Patient: Holly Hendrix  Procedure(s) Performed: RIGHT SHOULDER ROTATOR CUFF REPAIR, EXTENSIVE DEBRIDEMENT (Right: Shoulder)  Patient Location: PACU  Anesthesia Type:General  Level of Consciousness: drowsy, patient cooperative and responds to stimulation  Airway & Oxygen Therapy: Patient Spontanous Breathing and Patient connected to face mask oxygen  Post-op Assessment: Report given to RN and Post -op Vital signs reviewed and stable  Post vital signs: Reviewed and stable  Last Vitals:  Vitals Value Taken Time  BP 150/96 02/13/22 1340  Temp    Pulse 76 02/13/22 1340  Resp 19 02/13/22 1340  SpO2 98 % 02/13/22 1340  Vitals shown include unvalidated device data.  Last Pain:  Vitals:   02/13/22 1002  TempSrc: Oral  PainSc: 0-No pain      Patients Stated Pain Goal: 3 (60/63/01 6010)  Complications: No notable events documented.

## 2022-02-14 ENCOUNTER — Encounter (HOSPITAL_BASED_OUTPATIENT_CLINIC_OR_DEPARTMENT_OTHER): Payer: Self-pay | Admitting: Orthopaedic Surgery

## 2022-02-20 ENCOUNTER — Ambulatory Visit (INDEPENDENT_AMBULATORY_CARE_PROVIDER_SITE_OTHER): Payer: BC Managed Care – PPO | Admitting: Orthopaedic Surgery

## 2022-02-20 DIAGNOSIS — S46011A Strain of muscle(s) and tendon(s) of the rotator cuff of right shoulder, initial encounter: Secondary | ICD-10-CM

## 2022-02-20 NOTE — Progress Notes (Signed)
Post-Op Visit Note   Patient: Holly Hendrix           Date of Birth: [birth year]           MRN: [id number] Visit Date: 02/20/2022 PCP: Irene Pap, PA-C   Assessment & Plan:  Chief Complaint:  Chief Complaint  Patient presents with   Right Shoulder - Routine Post Op   Visit Diagnoses:  1. Traumatic complete tear of right rotator cuff, initial encounter     Plan: [name] is 1 week status post right shoulder arthroscopy with debridement.  She states that she is doing really well and takes occasional pain medication.  Feels a pulling sensation at times.  Her son is present today.  Examination of right shoulder shows expected postoperative swelling and bruising.  Incisions are healed without any drainage or infection.  She has appropriate range of motion for 1 week postop.  Neurovascular intact distally.  Sutures removed Steri-Strips applied.  I went over the arthroscopy pictures with her which showed an irreparable rotator cuff tear.  All questions were answered.  At this point we will start physical therapy to rehab the shoulder.  She understands that in the future if she has problems with her shoulder she might be a candidate for a reverse.  We will recheck her in 4 weeks.  Follow-Up Instructions: Return in about 4 weeks (around 03/20/2022).   Orders:  Orders Placed This Encounter  Procedures   Ambulatory referral to Physical Therapy   No orders of the defined types were placed in this encounter.   Imaging: No results found.  PMFS History: Patient Active Problem List   Diagnosis Date Noted   Complete tear of right rotator cuff 02/13/2022   Controlled type 2 diabetes mellitus without complication, without long-term current use of insulin ([place]) 07/26/2021   Hypokalemia 07/26/2021   Lymphocytosis 04/07/2019   Intramural uterine fibroid 02/14/2019   Chronic foot pain, left 03/16/2018   Enlarged thyroid 08/27/2017   Estrogen deficiency 08/27/2017   Obesity (BMI  30-39.9) 06/13/2016   Hyperlipidemia associated with type 2 diabetes mellitus ([place]) 07/29/2013   DENTAL CARIES 10/05/2009   INCONTINENCE, URGE 08/02/2009   HYPERTENSION, BENIGN ESSENTIAL 06/06/2008   ALLERGIC RHINITIS 06/06/2008   Past Medical History:  Diagnosis Date   Diabetes mellitus type 2, controlled, without complications ([place])    Diabetes type 2, controlled ([place]) 06/13/2016   Diet controlled    Elevated LDL cholesterol level    Headache    History of low potassium    Hyperlipidemia    Hypertension    PMB (postmenopausal bleeding)    Rotator cuff disorder, right     Family History  Problem Relation Age of Onset   Arthritis Sister    Diabetes Sister    Hypertension Sister    Cancer Father        unknown    Arthritis Brother    Ulcers Brother    Diabetes Sister    Hypertension Sister    Arthritis Sister    Colon cancer Neg Hx     Past Surgical History:  Procedure Laterality Date   DILATATION & CURETTAGE/HYSTEROSCOPY WITH MYOSURE N/A 09/27/2019   Procedure: [place];  Surgeon: Megan Salon, MD;  Location: [facility];  Service: Gynecology;  Laterality: N/A;  to follow first case of AM   INDUCED ABORTION     SHOULDER ARTHROSCOPY WITH ROTATOR CUFF REPAIR AND SUBACROMIAL DECOMPRESSION Right 02/13/2022   Procedure: RIGHT  SHOULDER ROTATOR CUFF REPAIR, EXTENSIVE DEBRIDEMENT;  Surgeon: Leandrew Koyanagi, MD;  Location: [facility];  Service: Orthopedics;  Laterality: Right;   TUBAL LIGATION     Social History   Occupational History   Not on file  Tobacco Use   Smoking status: Never   Smokeless tobacco: Never  Vaping Use   Vaping Use: Never used  Substance and Sexual Activity   Alcohol use: No   Drug use: No   Sexual activity: Not Currently    Birth control/protection: Post-menopausal, Surgical    Comment: BTL

## 2022-02-26 ENCOUNTER — Encounter: Payer: Self-pay | Admitting: Internal Medicine

## 2022-02-26 ENCOUNTER — Ambulatory Visit: Payer: BC Managed Care – PPO | Admitting: Rehabilitative and Restorative Service Providers"

## 2022-02-26 ENCOUNTER — Encounter: Payer: Self-pay | Admitting: Rehabilitative and Restorative Service Providers"

## 2022-02-26 ENCOUNTER — Other Ambulatory Visit: Payer: Self-pay

## 2022-02-26 DIAGNOSIS — R600 Localized edema: Secondary | ICD-10-CM

## 2022-02-26 DIAGNOSIS — G8929 Other chronic pain: Secondary | ICD-10-CM | POA: Diagnosis not present

## 2022-02-26 DIAGNOSIS — M25511 Pain in right shoulder: Secondary | ICD-10-CM | POA: Diagnosis not present

## 2022-02-26 DIAGNOSIS — M6281 Muscle weakness (generalized): Secondary | ICD-10-CM | POA: Diagnosis not present

## 2022-02-26 NOTE — Therapy (Signed)
OUTPATIENT PHYSICAL THERAPY EVALUATION   Patient Name: Holly Hendrix MRN: [id number] DOB:1960-11-15, 61 y.o., female Today's Date: 02/26/2022  END OF SESSION:    PT End of Session - 02/26/22 0918     Visit Number 1    Number of Visits 20    Date for PT Re-Evaluation 05/07/22    Authorization Type BCBS $72 copay    Progress Note Due on Visit 10    PT Start Time 0925    PT Stop Time 1005    PT Time Calculation (min) 40 min    Activity Tolerance Patient limited by pain    Behavior During Therapy [facility] for tasks assessed/performed             Past Medical History:  Diagnosis Date   Diabetes mellitus type 2, controlled, without complications ([place])    Diabetes type 2, controlled ([place]) 06/13/2016   Diet controlled    Elevated LDL cholesterol level    Headache    History of low potassium    Hyperlipidemia    Hypertension    PMB (postmenopausal bleeding)    Rotator cuff disorder, right    Past Surgical History:  Procedure Laterality Date   DILATATION & CURETTAGE/HYSTEROSCOPY WITH MYOSURE N/A 09/27/2019   Procedure: [place];  Surgeon: Megan Salon, MD;  Location: [place];  Service: Gynecology;  Laterality: N/A;  to follow first case of AM   INDUCED ABORTION     SHOULDER ARTHROSCOPY WITH ROTATOR CUFF REPAIR AND SUBACROMIAL DECOMPRESSION Right 02/13/2022   Procedure: RIGHT SHOULDER ROTATOR CUFF REPAIR, EXTENSIVE DEBRIDEMENT;  Surgeon: Leandrew Koyanagi, MD;  Location: [place];  Service: Orthopedics;  Laterality: Right;   TUBAL LIGATION     Patient Active Problem List   Diagnosis Date Noted   Complete tear of right rotator cuff 02/13/2022   Controlled type 2 diabetes mellitus without complication, without long-term current use of insulin ([place]) 07/26/2021   Hypokalemia 07/26/2021   Lymphocytosis 04/07/2019   Intramural uterine fibroid 02/14/2019   Chronic foot pain, left 03/16/2018   Enlarged  thyroid 08/27/2017   Estrogen deficiency 08/27/2017   Obesity (BMI 30-39.9) 06/13/2016   Hyperlipidemia associated with type 2 diabetes mellitus ([place]) 07/29/2013   DENTAL CARIES 10/05/2009   INCONTINENCE, URGE 08/02/2009   HYPERTENSION, BENIGN ESSENTIAL 06/06/2008   ALLERGIC RHINITIS 06/06/2008    PCP: Francis Gaines B PA -C  REFERRING PROVIDER: Leandrew Koyanagi, MD  REFERRING DIAG: [phone] (ICD-10-CM) - Traumatic complete tear of right rotator cuff, initial encounter  THERAPY DIAG:  Chronic right shoulder pain  Muscle weakness (generalized)  Localized edema  Rationale for Evaluation and Treatment Rehabilitation  ONSET DATE: surgery date 02/13/2022  SUBJECTIVE:  SUBJECTIVE STATEMENT: S/p right shoulder scope, debridement, irreparable rotator cuff tear on 02/13/2022.  Pt indicated it is hard to reach up with Rt arm.  Pt indicated feeling pulling in arm when trying to use it.  Pt indicated "not too bad" at rest.  Pt indicated "months of trouble" prior to surgery.   Pt currently wearing sling when she goes out.   Has some trouble sleeping due to pain.   Pt indicated she drove today with sling on Rt arm.   PERTINENT HISTORY: DM, hyperlipidemia, HTN  PAIN:  NPRS scale: at current: 4/10  at worst 6/10 Pain location: Rt shoulder  Pain description: pulling  Aggravating factors: lifting, reaching, using arm Relieving factors: OTC medicine  PRECAUTIONS: None  (no repair of rotator cuff)  WEIGHT BEARING RESTRICTIONS No  FALLS:  Has patient fallen in last 6 months? 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   PLOF:  independent , Rt handed.  Goes to church, housework.     PATIENT GOALS    Reduce pain,   OBJECTIVE:   PATIENT SURVEYS:  02/26/2022 FOTO intake:  41   predicted:  61  COGNITION: 02/26/2022  Overall cognitive status: WFL     SENSATION: 02/26/2022 WFL  POSTURE: 02/26/2022 Rounded shoulders, sling on Rt arm.  Localized edema noted Rt shoulder  UPPER EXTREMITY ROM:   ROM Right 02/26/2022 Left 02/26/2022  Shoulder flexion Unable to perform AROM in sitting or supine due to pain/weakness  PROM 70 in supine c pain WFL  Shoulder extension    Shoulder abduction PROM 100 in supine c pain WFL  Shoulder adduction    Shoulder internal rotation PROM 30 deg in 45 deg abduction in supine c pain WFL  Shoulder external rotation PROM 50 deg in 45 deg abduction in supine c pain WFL  Elbow flexion    Elbow extension    Wrist flexion    Wrist extension    Wrist ulnar deviation    Wrist radial deviation    Wrist pronation    Wrist supination    (Blank rows = not tested)  UPPER EXTREMITY MMT:  MMT Right 02/26/2022 Left 02/26/2022  Shoulder flexion 1/5 c pain 5/5  Shoulder extension    Shoulder abduction 1/5 c pain 5/5  Shoulder adduction    Shoulder internal rotation 1/5 c pain 5/5  Shoulder external rotation 1/5 c pain 5/5  Middle trapezius    Lower trapezius    Elbow flexion 4/5 5/5  Elbow extension 4/5 5/5  Wrist flexion    Wrist extension    Wrist ulnar deviation    Wrist radial deviation    Wrist pronation    Wrist supination    Grip strength (lbs)    (Blank rows = not tested)  SPECIAL TESTS: 02/26/2022  (+) Shrug Rt in elevation against gravity.  (+) Drop arm Rt  JOINT MOBILITY TESTING:  02/26/2022 Normal mobility within available range Rt GH joint.  Guarding noted c pain  PALPATION:  02/26/2022 Tenderness generally surrounding Rt shoulder in all areas.     TODAY'S TREATMENT: . 02/26/2022 Therex:    HEP instruction/performance c cues for techniques, handout provided.  Trial set performed of each for  comprehension and symptom assessment.  See below for exercise list  Manual: Rt shoulder g2 inferior joint mobs mid range, early range in flexion, scaption in supine.  Prom to  tolerance.    PATIENT EDUCATION: 02/26/2022 Education details: HEP, POC Person educated: Patient Education method: Consulting civil engineer, Demonstration, Verbal cues, and Handouts Education comprehension: verbalized understanding, returned demonstration, and verbal cues required   HOME EXERCISE PROGRAM: Access Code: [email or web address] URL: [email or web address] Date: 02/26/2022 Prepared by: Scot Jun  Exercises - Seated Scapular Retraction  - 3-5 x daily - 7 x weekly - 1 sets - 10 reps - 3-5 hold - Standing Horizontal Shoulder Pendulum Supported with Arm Bent  - 2-3 x daily - 7 x weekly - 1-2 sets - 10 reps - Standing Flexion Extension Shoulder Pendulum Supported with Arm Bent  - 2-3 x daily - 7 x weekly - 1-2 sets - 10 reps - Standing Circular Shoulder Pendulum Supported with Arm Bent  - 2-3 x daily - 7 x weekly - 1-2 sets - 10 reps - Circular Shoulder Pendulum with Table Support  - 2-3 x daily - 7 x weekly - 1-2 sets - 10 reps  ASSESSMENT:  CLINICAL IMPRESSION: Patient is a 61 y.o. who comes to clinic with complaints of Rt shoulder pain s/p Rt shoulder arthroscopy debridement and irreparable rotator cuff per MD referral with mobility, strength and movement coordination deficits that impair their ability to perform usual daily and recreational functional activities without increase difficulty/symptoms at this time.  Patient to benefit from skilled PT services to address impairments and limitations to improve to previous level of function without restriction secondary to condition.    OBJECTIVE IMPAIRMENTS decreased coordination, decreased mobility, decreased ROM, decreased strength, hypomobility, increased fascial restrictions, impaired perceived functional ability, impaired flexibility, impaired UE functional  use, improper body mechanics, postural dysfunction, and pain.   ACTIVITY LIMITATIONS carrying, lifting, sleeping, bathing, toileting, dressing, and reach over head  PARTICIPATION LIMITATIONS: meal prep, cleaning, laundry, interpersonal relationship, driving, and community activity  PERSONAL FACTORS  DM, hyperlipidemia, HTN, "irreparable RTC per MD referral"  are also affecting patient's functional outcome.   REHAB POTENTIAL: Fair to good  CLINICAL DECISION MAKING: Stable/uncomplicated  EVALUATION COMPLEXITY: Low   GOALS: Goals reviewed with patient? Yes  Short term PT Goals (target date for Short term goals are 3 weeks 03/19/2022) Patient will demonstrate independent use of home exercise program to maintain progress from in clinic treatments. Goal status: New   Long term PT goals (target dates for all long term goals are 10 weeks  05/07/2022 )   1. Patient will demonstrate/report pain at worst less than or equal to 2/10 to facilitate minimal limitation in daily activity secondary to pain symptoms. Goal status: New   2. Patient will demonstrate independent use of home exercise program to facilitate ability to maintain/progress functional gains from skilled physical therapy services. Goal status: New   3. Patient will demonstrate FOTO outcome > or = 61 % to indicate reduced disability due to condition. Goal status: New   4.  Patient will demonstrate Rt UE MMT 4/5 throughout to facilitate lifting, reaching, carrying at [facility] in daily activity.   Goal status: New   5.  Patient will demonstrate Rt GH joint AROM WFL s symptoms to facilitate usual overhead reaching, self care, dressing at PLOF.     Goal status: New   6.  Patient will demonstrate/report ability to return to work at [employer].   Goal status: New     PLAN: PT FREQUENCY: 1-2x/week  PT DURATION: 10 weeks  PLANNED INTERVENTIONS: Therapeutic exercises, Therapeutic activity, Neuro Muscular re-education, Balance  training, Gait training, Patient/Family education, Joint mobilization, Stair training, DME instructions, Dry  Needling, Electrical stimulation, Traction, Cryotherapy, Moist heat, Taping, Ultrasound, Ionotophoresis '4mg'$ /ml Dexamethasone, and Manual therapy.  All included unless contraindicated   PLAN FOR NEXT SESSION: Review HEP knowledge/results.   Progressive ROM/AROM , limit shrug activity   Scot Jun, PT, DPT, OCS, ATC 02/26/22  10:09 AM

## 2022-03-01 ENCOUNTER — Ambulatory Visit: Payer: BC Managed Care – PPO | Admitting: Rehabilitative and Restorative Service Providers"

## 2022-03-01 ENCOUNTER — Encounter: Payer: Self-pay | Admitting: Rehabilitative and Restorative Service Providers"

## 2022-03-01 DIAGNOSIS — M25511 Pain in right shoulder: Secondary | ICD-10-CM | POA: Diagnosis not present

## 2022-03-01 DIAGNOSIS — M6281 Muscle weakness (generalized): Secondary | ICD-10-CM | POA: Diagnosis not present

## 2022-03-01 DIAGNOSIS — R600 Localized edema: Secondary | ICD-10-CM

## 2022-03-01 DIAGNOSIS — G8929 Other chronic pain: Secondary | ICD-10-CM

## 2022-03-01 NOTE — Therapy (Signed)
OUTPATIENT PHYSICAL THERAPY TREATMENT NOTE   Patient Name: Holly Hendrix MRN: [id number] DOB:25-Oct-1960, 61 y.o., female Today's Date: 03/01/2022  END OF SESSION:   PT End of Session - 03/01/22 1615     Visit Number 2    Number of Visits 20    Date for PT Re-Evaluation 05/07/22    Authorization Type [employer] $72 copay    Progress Note Due on Visit 10    PT Start Time 4010    PT Stop Time 1425    PT Time Calculation (min) 40 min    Activity Tolerance Patient tolerated treatment well;No increased pain    Behavior During Therapy WFL for tasks assessed/performed             Past Medical History:  Diagnosis Date   Diabetes mellitus type 2, controlled, without complications ([place])    Diabetes type 2, controlled ([place]) 06/13/2016   Diet controlled    Elevated LDL cholesterol level    Headache    History of low potassium    Hyperlipidemia    Hypertension    PMB (postmenopausal bleeding)    Rotator cuff disorder, right    Past Surgical History:  Procedure Laterality Date   DILATATION & CURETTAGE/HYSTEROSCOPY WITH MYOSURE N/A 09/27/2019   Procedure: [place];  Surgeon: Megan Salon, MD;  Location: [place];  Service: Gynecology;  Laterality: N/A;  to follow first case of AM   INDUCED ABORTION     SHOULDER ARTHROSCOPY WITH ROTATOR CUFF REPAIR AND SUBACROMIAL DECOMPRESSION Right 02/13/2022   Procedure: RIGHT SHOULDER ROTATOR CUFF REPAIR, EXTENSIVE DEBRIDEMENT;  Surgeon: Leandrew Koyanagi, MD;  Location: [place];  Service: Orthopedics;  Laterality: Right;   TUBAL LIGATION     Patient Active Problem List   Diagnosis Date Noted   Complete tear of right rotator cuff 02/13/2022   Controlled type 2 diabetes mellitus without complication, without long-term current use of insulin ([place]) 07/26/2021   Hypokalemia 07/26/2021   Lymphocytosis 04/07/2019   Intramural uterine fibroid 02/14/2019   Chronic foot pain, left  03/16/2018   Enlarged thyroid 08/27/2017   Estrogen deficiency 08/27/2017   Obesity (BMI 30-39.9) 06/13/2016   Hyperlipidemia associated with type 2 diabetes mellitus ([place]) 07/29/2013   DENTAL CARIES 10/05/2009   INCONTINENCE, URGE 08/02/2009   HYPERTENSION, BENIGN ESSENTIAL 06/06/2008   ALLERGIC RHINITIS 06/06/2008     THERAPY DIAG:  Chronic right shoulder pain  Muscle weakness (generalized)  Localized edema  PCP: Irene Pap PA -C   REFERRING PROVIDER: Leandrew Koyanagi, MD   REFERRING DIAG: [phone] (ICD-10-CM) - Traumatic complete tear of right rotator cuff, initial encounter   THERAPY DIAG:  Chronic right shoulder pain   Muscle weakness (generalized)   Localized edema   Rationale for Evaluation and Treatment Rehabilitation   ONSET DATE: surgery date 02/13/2022   SUBJECTIVE:  SUBJECTIVE STATEMENT: [place] reports good early HEP compliance.  She is sleeping "OK" and is doing a good job with her pain management.  S/p right shoulder scope, debridement, irreparable rotator cuff tear on 02/13/2022.  Pt indicated it is hard to reach up with Rt arm.  Pt indicated feeling pulling in arm when trying to use it.  Pt indicated "not too bad" at rest.  Pt indicated "months of trouble" prior to surgery.   Pt currently wearing sling when she goes out.   Has some trouble sleeping due to pain.   Pt indicated she drove today with sling on Rt arm.    PERTINENT HISTORY: DM, hyperlipidemia, HTN   PAIN:  NPRS scale: at current: 4/10 at worst 6/10 Pain location: Rt shoulder  Pain description: pulling  Aggravating factors: lifting, reaching, using arm Relieving factors: OTC medicine   PRECAUTIONS: None  (no repair of rotator cuff)   WEIGHT BEARING RESTRICTIONS No   FALLS:  Has patient fallen in last 6  months? 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    PLOF:  independent , Rt handed.  Goes to church, housework.     PATIENT GOALS    Reduce pain,    OBJECTIVE:    PATIENT SURVEYS:  02/26/2022 FOTO intake:  41   predicted:  61   COGNITION: 02/26/2022            Overall cognitive status: WFL                                     SENSATION: 02/26/2022 WFL   POSTURE: 02/26/2022 Rounded shoulders, sling on Rt arm.  Localized edema noted Rt shoulder   UPPER EXTREMITY ROM:    ROM Right 02/26/2022 Left 02/26/2022 Right 03/01/2022  Shoulder flexion Unable to perform AROM in sitting or supine due to pain/weakness   PROM 70 in supine c pain WFL 130 assessed supine  Shoulder extension       Shoulder abduction PROM 100 in supine c pain WFL   Shoulder horizontal adduction     20 assessed supine  Shoulder internal rotation PROM 30 deg in 45 deg abduction in supine c pain WFL 65 assessed supine at 70  degrees abduction  Shoulder external rotation PROM 50 deg in 45 deg abduction in supine c pain WFL 80 assessed supine at 70 degrees abduction  Elbow flexion       Elbow extension       Wrist flexion       Wrist extension       Wrist ulnar deviation       Wrist radial deviation       Wrist pronation       Wrist supination       (Blank rows = not tested)   UPPER EXTREMITY MMT:   MMT Right 02/26/2022 Left 02/26/2022  Shoulder flexion 1/5 c pain 5/5  Shoulder extension      Shoulder abduction 1/5 c pain 5/5  Shoulder adduction      Shoulder internal rotation 1/5 c pain 5/5  Shoulder external rotation 1/5 c pain 5/5  Middle trapezius      Lower trapezius      Elbow flexion 4/5 5/5  Elbow extension 4/5 5/5  Wrist flexion  Wrist extension      Wrist ulnar deviation      Wrist radial deviation      Wrist pronation      Wrist supination       Grip strength (lbs)      (Blank rows = not tested)   SPECIAL TESTS: 02/26/2022             (+) Shrug Rt in elevation against gravity.  (+) Drop arm Rt   JOINT MOBILITY TESTING:  02/26/2022 Normal mobility within available range Rt GH joint.  Guarding noted c pain   PALPATION:  02/26/2022 Tenderness generally surrounding Rt shoulder in all areas.                TODAY'S TREATMENT: . 03/01/2022 [place] forward and back; clockwise (CW); counter clockwise (CCW); cross body 20X each AAROM and with L hand on table Supine arm raises 10X AAROM 3 seconds Shoulder blade pinches 10X 5 seconds Shoulder ER Isometrics (to comfort) 10X 5 seconds  Manual PROM for flexion, IR, ER, horizontal adduction   02/26/2022 Therex:    HEP instruction/performance c cues for techniques, handout provided.  Trial set performed of each for comprehension and symptom assessment.  See below for exercise list   Manual: Rt shoulder g2 inferior joint mobs mid range, early range in flexion, scaption in supine.  Prom to tolerance.      PATIENT EDUCATION: 02/26/2022 Education details: HEP, POC Person educated: Patient Education method: Consulting civil engineer, Demonstration, Verbal cues, and Handouts Education comprehension: verbalized understanding, returned demonstration, and verbal cues required     HOME EXERCISE PROGRAM: Access Code: [email or web address] URL: [email or web address] Date: 03/01/2022 Prepared by: Vista Mink  Exercises - Seated Scapular Retraction  - 3-5 x daily - 7 x weekly - 1 sets - 10 reps - 3-5 hold - Standing Horizontal Shoulder Pendulum Supported with Arm Bent  - 2-3 x daily - 7 x weekly - 1-2 sets - 10 reps - Standing Flexion Extension Shoulder Pendulum Supported with Arm Bent  - 2-3 x daily - 7 x weekly - 1-2 sets - 10 reps - Standing Circular Shoulder Pendulum Supported with Arm Bent  - 2-3 x daily - 7 x weekly - 1-2 sets - 10 reps - Circular Shoulder Pendulum with Table Support   - 2-3 x daily - 7 x weekly - 1-2 sets - 10 reps - Supine Scapular Protraction in Flexion with Dumbbells  - 2 x daily - 7 x weekly - 1 sets - 20 reps - 3 seconds hold - Standing Isometric Shoulder External Rotation with Doorway and Towel Roll  - 1 x daily - 7 x weekly - 2 sets - 10 reps - 5 hold  ASSESSMENT:   CLINICAL IMPRESSION: Aeralyn is moving much better today objectively vs evaluation.  She did a good job with her HEP recall and had no difficulty with 2 early phase scapular and rotator cuff activation activities.  Continue AROM, functional and appropriate strength work to meet long-term goals.     OBJECTIVE IMPAIRMENTS decreased coordination, decreased mobility, decreased ROM, decreased strength, hypomobility, increased fascial restrictions, impaired perceived functional ability, impaired flexibility, impaired UE functional use, improper body mechanics, postural dysfunction, and pain.    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  REHAB POTENTIAL: Fair to good   CLINICAL DECISION MAKING: Stable/uncomplicated   EVALUATION COMPLEXITY: Low     GOALS: Goals reviewed with patient? Yes   Short term PT Goals (target date for Short term goals are 3 weeks 03/19/2022) Patient will demonstrate independent use of home exercise program to maintain progress from in clinic treatments. Goal status: On Going 03/01/2022   Long term PT goals (target dates for all long term goals are 10 weeks  05/07/2022 )   1. Patient will demonstrate/report pain at worst less than or equal to 2/10 to facilitate minimal limitation in daily activity secondary to pain symptoms. Goal status: New   2. Patient will  demonstrate independent use of home exercise program to facilitate ability to maintain/progress functional gains from skilled physical therapy services. Goal status: New   3. Patient will demonstrate FOTO outcome > or = 61 % to indicate reduced disability due to condition. Goal status: New   4.  Patient will demonstrate Rt UE MMT 4/5 throughout to facilitate lifting, reaching, carrying at [facility] in daily activity.    Goal status: New   5.  Patient will demonstrate Rt GH joint AROM WFL s symptoms to facilitate usual overhead reaching, self care, dressing at PLOF.     Goal status: New   6.  Patient will demonstrate/report ability to return to work at [employer].   Goal status: New       PLAN: PT FREQUENCY: 1-2x/week   PT DURATION: 10 weeks   PLANNED INTERVENTIONS: Therapeutic exercises, Therapeutic activity, Neuro Muscular re-education, Balance training, Gait training, Patient/Family education, Joint mobilization, Stair training, DME instructions, Dry Needling, Electrical stimulation, Traction, Cryotherapy, Moist heat, Taping, Ultrasound, Ionotophoresis '4mg'$ /ml Dexamethasone, and Manual therapy.  All included unless contraindicated     PLAN FOR NEXT SESSION: Passive, active-assisted and AROM, appropriate functional strength activities while limiting shrug/shoulder hiking activity    Farley Ly, PT, MPT 03/01/2022, 4:21 PM

## 2022-03-06 ENCOUNTER — Encounter: Payer: BC Managed Care – PPO | Admitting: Physical Therapy

## 2022-03-08 ENCOUNTER — Encounter: Payer: Self-pay | Admitting: Rehabilitative and Restorative Service Providers"

## 2022-03-08 ENCOUNTER — Ambulatory Visit: Payer: BC Managed Care – PPO | Admitting: Rehabilitative and Restorative Service Providers"

## 2022-03-08 DIAGNOSIS — R600 Localized edema: Secondary | ICD-10-CM | POA: Diagnosis not present

## 2022-03-08 DIAGNOSIS — M25511 Pain in right shoulder: Secondary | ICD-10-CM

## 2022-03-08 DIAGNOSIS — G8929 Other chronic pain: Secondary | ICD-10-CM | POA: Diagnosis not present

## 2022-03-08 DIAGNOSIS — M6281 Muscle weakness (generalized): Secondary | ICD-10-CM

## 2022-03-08 NOTE — Therapy (Signed)
OUTPATIENT PHYSICAL THERAPY TREATMENT NOTE   Patient Name: Holly Hendrix MRN: [id number] DOB:[birth year], 61 y.o., female Today's Date: 03/08/2022  END OF SESSION:   PT End of Session - 03/08/22 0807     Visit Number 3    Number of Visits 20    Date for PT Re-Evaluation 05/07/22    Authorization Type BCBS $72 copay    Progress Note Due on Visit 10    PT Start Time 0803    PT Stop Time 7096    PT Time Calculation (min) 44 min    Activity Tolerance Patient tolerated treatment well;No increased pain    Behavior During Therapy WFL for tasks assessed/performed              Past Medical History:  Diagnosis Date   Diabetes mellitus type 2, controlled, without complications ([place])    Diabetes type 2, controlled ([place]) 06/13/2016   Diet controlled    Elevated LDL cholesterol level    Headache    History of low potassium    Hyperlipidemia    Hypertension    PMB (postmenopausal bleeding)    Rotator cuff disorder, right    Past Surgical History:  Procedure Laterality Date   DILATATION & CURETTAGE/HYSTEROSCOPY WITH MYOSURE N/A 09/27/2019   Procedure: [place];  Surgeon: Megan Salon, MD;  Location: [place];  Service: Gynecology;  Laterality: N/A;  to follow first case of AM   INDUCED ABORTION     SHOULDER ARTHROSCOPY WITH ROTATOR CUFF REPAIR AND SUBACROMIAL DECOMPRESSION Right 02/13/2022   Procedure: RIGHT SHOULDER ROTATOR CUFF REPAIR, EXTENSIVE DEBRIDEMENT;  Surgeon: Leandrew Koyanagi, MD;  Location: [place];  Service: Orthopedics;  Laterality: Right;   TUBAL LIGATION     Patient Active Problem List   Diagnosis Date Noted   Complete tear of right rotator cuff 02/13/2022   Controlled type 2 diabetes mellitus without complication, without long-term current use of insulin ([place]) 07/26/2021   Hypokalemia 07/26/2021   Lymphocytosis 04/07/2019   Intramural uterine fibroid 02/14/2019   Chronic foot pain,  left 03/16/2018   Enlarged thyroid 08/27/2017   Estrogen deficiency 08/27/2017   Obesity (BMI 30-39.9) 06/13/2016   Hyperlipidemia associated with type 2 diabetes mellitus ([place]) 07/29/2013   DENTAL CARIES 10/05/2009   INCONTINENCE, URGE 08/02/2009   HYPERTENSION, BENIGN ESSENTIAL 06/06/2008   ALLERGIC RHINITIS 06/06/2008     THERAPY DIAG:  Chronic right shoulder pain  Muscle weakness (generalized)  Localized edema  PCP: Irene Pap PA -C   REFERRING PROVIDER: Leandrew Koyanagi, MD   REFERRING DIAG: [phone] (ICD-10-CM) - Traumatic complete tear of right rotator cuff, initial encounter   THERAPY DIAG:  Chronic right shoulder pain   Muscle weakness (generalized)   Localized edema   Rationale for Evaluation and Treatment Rehabilitation   ONSET DATE: surgery date 02/13/2022   SUBJECTIVE:  SUBJECTIVE STATEMENT: Holly Hendrix reports continued HEP compliance.  She is sleeping "pretty well" and continues to do a good job with her pain management.  S/p right shoulder scope, debridement, irreparable rotator cuff tear on 02/13/2022.  Pt indicated it is hard to reach up with Rt arm.  Pt indicated feeling pulling in arm when trying to use it.  Pt indicated "not too bad" at rest.  Pt indicated "months of trouble" prior to surgery.   Pt currently wearing sling when she goes out.   Has some trouble sleeping due to pain.   Pt indicated she drove today with sling on Rt arm.    PERTINENT HISTORY: DM, hyperlipidemia, HTN   PAIN:  NPRS scale: at current: 4/10 at worst 6/10 Pain location: Rt shoulder  Pain description: pulling  Aggravating factors: lifting, reaching, using arm Relieving factors: OTC medicine   PRECAUTIONS: None  (no repair of rotator cuff)   WEIGHT BEARING RESTRICTIONS No   FALLS:  Has patient  fallen in last 6 months? 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    PLOF:  independent , Rt handed.  Goes to church, housework.     PATIENT GOALS    Reduce pain,    OBJECTIVE:    PATIENT SURVEYS:  02/26/2022 FOTO intake:  41   predicted:  61   COGNITION: 02/26/2022            Overall cognitive status: WFL                                     SENSATION: 02/26/2022 WFL   POSTURE: 02/26/2022 Rounded shoulders, sling on Rt arm.  Localized edema noted Rt shoulder   UPPER EXTREMITY ROM:    ROM Right 02/26/2022 Left 02/26/2022 Right 03/01/2022 Right 03/08/2022  Shoulder flexion Unable to perform AROM in sitting or supine due to pain/weakness   PROM 70 in supine c pain WFL 130 assessed supine 140 assessed supine  Shoulder extension        Shoulder abduction PROM 100 in supine c pain WFL    Shoulder horizontal adduction     20 assessed supine 20 assessed supine  Shoulder internal rotation PROM 30 deg in 45 deg abduction in supine c pain WFL 65 assessed supine at 70  degrees abduction 70 assessed supine at 70 degrees abduction  Shoulder external rotation PROM 50 deg in 45 deg abduction in supine c pain WFL 80 assessed supine at 70 degrees abduction 85 assessed supine at 70 degrees abduction  Elbow flexion        Elbow extension        Wrist flexion        Wrist extension        Wrist ulnar deviation        Wrist radial deviation        Wrist pronation        Wrist supination        (Blank rows = not tested)   UPPER EXTREMITY MMT:   MMT Right 02/26/2022 Left 02/26/2022  Shoulder flexion 1/5 c pain 5/5  Shoulder extension      Shoulder abduction 1/5 c pain 5/5  Shoulder adduction      Shoulder internal rotation 1/5 c pain 5/5  Shoulder external rotation 1/5  c pain 5/5  Middle trapezius      Lower trapezius      Elbow flexion 4/5  5/5  Elbow extension 4/5 5/5  Wrist flexion      Wrist extension      Wrist ulnar deviation      Wrist radial deviation      Wrist pronation      Wrist supination      Grip strength (lbs)      (Blank rows = not tested)   SPECIAL TESTS: 02/26/2022             (+) Shrug Rt in elevation against gravity.  (+) Drop arm Rt   JOINT MOBILITY TESTING:  02/26/2022 Normal mobility within available range Rt GH joint.  Guarding noted c pain   PALPATION:  02/26/2022 Tenderness generally surrounding Rt shoulder in all areas.                TODAY'S TREATMENT: . 03/08/2022 Codmans forward and back; clockwise (CW); counter clockwise (CCW); cross body 20X each AAROM and with Lt hand on table  Supine arm raises 2 sets of 20X AAROM 3 seconds (Lt hand stabilizes Rt elbow) with 1# Shoulder blade pinches 10X 5 seconds Shoulder ER Isometrics (to comfort) 10X 5 seconds Side-lie ER 2 sets of 10 slow eccentrics (lie Lt, Rt works) with 1# Pulley flexion and scaption with protraction 1st (palm facing in) 10X 10 seconds each direction   03/01/2022 Codmans forward and back; clockwise (CW); counter clockwise (CCW); cross body 20X each AAROM and with L hand on table Supine arm raises 10X AAROM 3 seconds Shoulder blade pinches 10X 5 seconds Shoulder ER Isometrics (to comfort) 10X 5 seconds  Manual PROM for flexion, IR, ER, horizontal adduction   02/26/2022 Therex:    HEP instruction/performance c cues for techniques, handout provided.  Trial set performed of each for comprehension and symptom assessment.  See below for exercise list   Manual: Rt shoulder g2 inferior joint mobs mid range, early range in flexion, scaption in supine.  Prom to tolerance.      PATIENT EDUCATION: 02/26/2022 Education details: HEP, POC Person educated: Patient Education method: Consulting civil engineer, Demonstration, Verbal cues, and Handouts Education comprehension: verbalized understanding, returned demonstration, and verbal cues  required     HOME EXERCISE PROGRAM: Access Code: [email or web address] URL: [email or web address] Date: 03/01/2022 Prepared by: Vista Mink  Exercises - Seated Scapular Retraction  - 3-5 x daily - 7 x weekly - 1 sets - 10 reps - 3-5 hold - Standing Horizontal Shoulder Pendulum Supported with Arm Bent  - 2-3 x daily - 7 x weekly - 1-2 sets - 10 reps - Standing Flexion Extension Shoulder Pendulum Supported with Arm Bent  - 2-3 x daily - 7 x weekly - 1-2 sets - 10 reps - Standing Circular Shoulder Pendulum Supported with Arm Bent  - 2-3 x daily - 7 x weekly - 1-2 sets - 10 reps - Circular Shoulder Pendulum with Table Support  - 2-3 x daily - 7 x weekly - 1-2 sets - 10 reps - Supine Scapular Protraction in Flexion with Dumbbells  - 2 x daily - 7 x weekly - 1 sets - 20 reps - 3 seconds hold - Standing Isometric Shoulder External Rotation with Doorway and Towel Roll  - 1 x daily - 7 x weekly - 2 sets - 10 reps - 5 hold  ASSESSMENT:   CLINICAL IMPRESSION: Nayelis reports and demonstrates good knowledge and compliance with her HEP.  Given her un-repairable tear and comfort with early activities, we progressed scapular and RTC strengthening today.  AROM, scapular and rotator cuff (RC) strength are a high priority with her current PT to allow for better Rt upper extremity use with all ADLs involving reaching and overhead function.     OBJECTIVE IMPAIRMENTS decreased coordination, decreased mobility, decreased ROM, decreased strength, hypomobility, increased fascial restrictions, impaired perceived functional ability, impaired flexibility, impaired UE functional use, improper body mechanics, postural dysfunction, and pain.    ACTIVITY LIMITATIONS carrying, lifting, sleeping, bathing, toileting, dressing, and reach over head   PARTICIPATION LIMITATIONS: meal prep, cleaning, laundry, interpersonal relationship, driving, and community activity   PERSONAL FACTORS  DM, hyperlipidemia, HTN,  "irreparable RTC per MD referral"  are also affecting patient's functional outcome.    REHAB POTENTIAL: Fair to good   CLINICAL DECISION MAKING: Stable/uncomplicated   EVALUATION COMPLEXITY: Low     GOALS: Goals reviewed with patient? Yes   Short term PT Goals (target date for Short term goals are 3 weeks 03/19/2022) Patient will demonstrate independent use of home exercise program to maintain progress from in clinic treatments. Goal status: On Going 03/08/2022   Long term PT goals (target dates for all long term goals are 10 weeks  05/07/2022 )   1. Patient will demonstrate/report pain at worst less than or equal to 2/10 to facilitate minimal limitation in daily activity secondary to pain symptoms. Goal status: On Going 03/08/2022   2. Patient will demonstrate independent use of home exercise program to facilitate ability to maintain/progress functional gains from skilled physical therapy services. Goal status: On Going 03/08/2022   3. Patient will demonstrate FOTO outcome > or = 61 % to indicate reduced disability due to condition. Goal status: New   4.  Patient will demonstrate Rt UE MMT 4/5 throughout to facilitate lifting, reaching, carrying at [facility] in daily activity.    Goal status: New   5.  Patient will demonstrate Rt GH joint AROM WFL s symptoms to facilitate usual overhead reaching, self care, dressing at PLOF.     Goal status: On Going 03/08/2022   6.  Patient will demonstrate/report ability to return to work at [employer].   Goal status: New       PLAN: PT FREQUENCY: 1-2x/week   PT DURATION: 10 weeks   PLANNED INTERVENTIONS: Therapeutic exercises, Therapeutic activity, Neuro Muscular re-education, Balance training, Gait training, Patient/Family education, Joint mobilization, Stair training, DME instructions, Dry Needling, Electrical stimulation, Traction, Cryotherapy, Moist heat, Taping, Ultrasound, Ionotophoresis '4mg'$ /ml Dexamethasone, and Manual therapy.  All  included unless contraindicated     PLAN FOR NEXT SESSION: Active-assisted and AROM, appropriate scapular, rotator cuff and functional strength activities while limiting shrug/shoulder hiking activity    Farley Ly, PT, MPT 03/08/2022, 11:40 AM

## 2022-03-11 ENCOUNTER — Ambulatory Visit: Payer: BC Managed Care – PPO | Admitting: Rehabilitative and Restorative Service Providers"

## 2022-03-11 ENCOUNTER — Encounter: Payer: Self-pay | Admitting: Rehabilitative and Restorative Service Providers"

## 2022-03-11 DIAGNOSIS — M6281 Muscle weakness (generalized): Secondary | ICD-10-CM

## 2022-03-11 DIAGNOSIS — G8929 Other chronic pain: Secondary | ICD-10-CM

## 2022-03-11 DIAGNOSIS — R600 Localized edema: Secondary | ICD-10-CM | POA: Diagnosis not present

## 2022-03-11 DIAGNOSIS — M25511 Pain in right shoulder: Secondary | ICD-10-CM

## 2022-03-11 NOTE — Therapy (Signed)
OUTPATIENT PHYSICAL THERAPY TREATMENT NOTE   Patient Name: Holly Hendrix MRN: [id number] DOB:02-Aug-1960, 61 y.o., female Today's Date: 03/11/2022  PCP: Francis Gaines B PA -C   REFERRING PROVIDER: Leandrew Koyanagi, MD  END OF SESSION:   PT End of Session - 03/11/22 1127     Visit Number 4    Number of Visits 20    Date for PT Re-Evaluation 05/07/22    Authorization Type BCBS $72 copay    Progress Note Due on Visit 10    PT Start Time 1137    PT Stop Time 1216    PT Time Calculation (min) 39 min    Activity Tolerance Patient tolerated treatment well    Behavior During Therapy WFL for tasks assessed/performed               Past Medical History:  Diagnosis Date   Diabetes mellitus type 2, controlled, without complications ([employer])    Diabetes type 2, controlled ([place]) 06/13/2016   Diet controlled    Elevated LDL cholesterol level    Headache    History of low potassium    Hyperlipidemia    Hypertension    PMB (postmenopausal bleeding)    Rotator cuff disorder, right    Past Surgical History:  Procedure Laterality Date   DILATATION & CURETTAGE/HYSTEROSCOPY WITH MYOSURE N/A 09/27/2019   Procedure: [place];  Surgeon: Megan Salon, MD;  Location: [place];  Service: Gynecology;  Laterality: N/A;  to follow first case of AM   INDUCED ABORTION     SHOULDER ARTHROSCOPY WITH ROTATOR CUFF REPAIR AND SUBACROMIAL DECOMPRESSION Right 02/13/2022   Procedure: RIGHT SHOULDER ROTATOR CUFF REPAIR, EXTENSIVE DEBRIDEMENT;  Surgeon: Leandrew Koyanagi, MD;  Location: [place];  Service: Orthopedics;  Laterality: Right;   TUBAL LIGATION     Patient Active Problem List   Diagnosis Date Noted   Complete tear of right rotator cuff 02/13/2022   Controlled type 2 diabetes mellitus without complication, without long-term current use of insulin ([place]) 07/26/2021   Hypokalemia 07/26/2021   Lymphocytosis 04/07/2019    Intramural uterine fibroid 02/14/2019   Chronic foot pain, left 03/16/2018   Enlarged thyroid 08/27/2017   Estrogen deficiency 08/27/2017   Obesity (BMI 30-39.9) 06/13/2016   Hyperlipidemia associated with type 2 diabetes mellitus ([place]) 07/29/2013   DENTAL CARIES 10/05/2009   INCONTINENCE, URGE 08/02/2009   HYPERTENSION, BENIGN ESSENTIAL 06/06/2008   ALLERGIC RHINITIS 06/06/2008     THERAPY DIAG:  Chronic right shoulder pain  Muscle weakness (generalized)  Localized edema    REFERRING DIAG: S46.011A (ICD-10-CM) - Traumatic complete tear of right rotator cuff, initial encounter   THERAPY DIAG:  Chronic right shoulder pain   Muscle weakness (generalized)   Localized edema   Rationale for Evaluation and Treatment Rehabilitation   ONSET DATE: surgery date 02/13/2022   SUBJECTIVE:  SUBJECTIVE STATEMENT: Pt indicated wearing sling when she leaves the house mostly.  Pt indicated having some pain while trying new exercise at home.    PERTINENT HISTORY: DM, hyperlipidemia, HTN   PAIN:  NPRS scale: no pain a rest upon arrival.  Pain location: Rt shoulder  Pain description: pulling  Aggravating factors: lifting, reaching, using arm Relieving factors: OTC medicine   PRECAUTIONS: None  (no repair of rotator cuff)   WEIGHT BEARING RESTRICTIONS No   FALLS:  Has patient fallen in last 6 months? 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    PLOF:  independent , Rt handed.  Goes to church, housework.     PATIENT GOALS    Reduce pain,    OBJECTIVE:    PATIENT SURVEYS:  02/26/2022 FOTO intake:  41   predicted:  61   COGNITION: 02/26/2022             Overall cognitive status: WFL                                     SENSATION: 02/26/2022 WFL   POSTURE: 02/26/2022 Rounded shoulders, sling on Rt arm.  Localized edema noted Rt shoulder   UPPER EXTREMITY ROM:    ROM Right 02/26/2022 Left 02/26/2022 Right 03/01/2022 Right 03/08/2022  Shoulder flexion Unable to perform AROM in sitting or supine due to pain/weakness   PROM 70 in supine c pain WFL 130 assessed supine 140 assessed supine  Shoulder extension        Shoulder abduction PROM 100 in supine c pain WFL    Shoulder horizontal adduction     20 assessed supine 20 assessed supine  Shoulder internal rotation PROM 30 deg in 45 deg abduction in supine c pain WFL 65 assessed supine at 70  degrees abduction 70 assessed supine at 70 degrees abduction  Shoulder external rotation PROM 50 deg in 45 deg abduction in supine c pain WFL 80 assessed supine at 70 degrees abduction 85 assessed supine at 70 degrees abduction  Elbow flexion        Elbow extension        Wrist flexion        Wrist extension        Wrist ulnar deviation        Wrist radial deviation        Wrist pronation        Wrist supination        (Blank rows = not tested)   UPPER EXTREMITY MMT:   MMT Right 02/26/2022 Left 02/26/2022  Shoulder flexion 1/5 c pain 5/5  Shoulder extension      Shoulder abduction 1/5 c pain 5/5  Shoulder adduction      Shoulder internal rotation 1/5 c pain 5/5  Shoulder external rotation 1/5 c pain 5/5  Middle trapezius      Lower trapezius      Elbow flexion 4/5 5/5  Elbow extension 4/5 5/5  Wrist flexion      Wrist extension      Wrist ulnar deviation      Wrist radial deviation      Wrist pronation      Wrist supination      Grip strength (lbs)      (Blank rows =  not tested)   SPECIAL TESTS: 02/26/2022             (+) Shrug Rt in elevation against gravity.  (+) Drop arm Rt   JOINT MOBILITY TESTING:  02/26/2022 Normal mobility within available range Rt GH joint.   Guarding noted c pain   PALPATION:  02/26/2022 Tenderness generally surrounding Rt shoulder in all areas.                TODAY'S TREATMENT: . 03/11/2022 Therex: Pulleys flexion, scaption to tolerance 5 sec holds 3 mins each way Supine 90 deg flexion hold to fatigue against gravity approx. 15 seconds Supine AAROM wand 1 lb c Lt hand around midline of body - 2 x 10 in available range (elbow extended) Supine protraction in 90 deg flexion AAROM c wand ( added wand due to inability to perform steady unsupported) - adjusted for home) Sidelying Rt shoulder ER c towel at side , cues for avoid compensations 2 x 10  Standing Rt shoulder isometric 5 sec on/off x 12 for flexion, abduction   03/08/2022 Codmans forward and back; clockwise (CW); counter clockwise (CCW); cross body 20X each AAROM and with Lt hand on table  Supine arm raises 2 sets of 20X AAROM 3 seconds (Lt hand stabilizes Rt elbow) with 1# Shoulder blade pinches 10X 5 seconds Shoulder ER Isometrics (to comfort) 10X 5 seconds Side-lie ER 2 sets of 10 slow eccentrics (lie Lt, Rt works) with 1# Pulley flexion and scaption with protraction 1st (palm facing in) 10X 10 seconds each direction   03/01/2022 Codmans forward and back; clockwise (CW); counter clockwise (CCW); cross body 20X each AAROM and with L hand on table Supine arm raises 10X AAROM 3 seconds Shoulder blade pinches 10X 5 seconds Shoulder ER Isometrics (to comfort) 10X 5 seconds  Manual PROM for flexion, IR, ER, horizontal adduction     PATIENT EDUCATION: 03/11/2022 Education details: HEP updates Person educated: Patient Education method: Explanation, Demonstration, Verbal cues, and Handouts Education comprehension: verbalized understanding, returned demonstration, and verbal cues required     HOME EXERCISE PROGRAM: Access Code: [email or web address] URL: [email or web address] Date: 03/11/2022 Prepared by: Scot Jun  Exercises - Seated Scapular  Retraction  - 5 x daily - 7 x weekly - 1 sets - 5 reps - 5 seconds hold - Sidelying Shoulder External Rotation Dumbbell  - 2 x daily - 7 x weekly - 2 sets - 10 reps - 3 second hold - Seated Shoulder Flexion AAROM with Pulley Behind  - 2 x daily - 7 x weekly - 1 sets - 10 reps - 5-10 seconds hold - Supine Shoulder Protraction with Dowel  - 2 x daily - 7 x weekly - 1-2 sets - 10 reps - 3 hold - Supine Shoulder Flexion Extension AAROM with Dowel  - 2-3 x daily - 7 x weekly - 1-2 sets - 10-15 reps - 3 hold - Standing Isometric Shoulder Flexion with Doorway - Arm Bent  - 1-2 x daily - 7 x weekly - 1 sets - 10 reps - 5-10 hold - Standing Isometric Shoulder Abduction with Doorway - Arm Bent (Mirrored)  - 1-2 x daily - 7 x weekly - 1 sets - 10 reps - 5-10 hold  ASSESSMENT:   CLINICAL IMPRESSION: Continued difficulty c active movement of Rt arm at this time, even in gravity reduced active range attempts.  Able to settle on a few additional active recruitment intervention for home today to allow progression in improving ability to utilize  Rt arm actively during day.   AAROM focus primarily in range due to weakness in Rt shoulder to ensure good mechanics in movements. Pendulum activity encouraged as desired at home but progress from in printout HEP as noted above.       OBJECTIVE IMPAIRMENTS decreased coordination, decreased mobility, decreased ROM, decreased strength, hypomobility, increased fascial restrictions, impaired perceived functional ability, impaired flexibility, impaired UE functional use, improper body mechanics, postural dysfunction, and pain.    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     REHAB POTENTIAL: Fair to good   CLINICAL DECISION MAKING: Stable/uncomplicated   EVALUATION COMPLEXITY: Low     GOALS: Goals reviewed with patient? Yes   Short term PT Goals (target date for Short term goals are 3 weeks 03/19/2022) Patient will demonstrate independent use of home exercise program to maintain progress from in clinic treatments. Goal status: On Going 03/08/2022   Long term PT goals (target dates for all long term goals are 10 weeks  05/07/2022 )   1. Patient will demonstrate/report pain at worst less than or equal to 2/10 to facilitate minimal limitation in daily activity secondary to pain symptoms. Goal status: On Going 03/08/2022   2. Patient will demonstrate independent use of home exercise program to facilitate ability to maintain/progress functional gains from skilled physical therapy services. Goal status: On Going 03/08/2022   3. Patient will demonstrate FOTO outcome > or = 61 % to indicate reduced disability due to condition. Goal status: on going - assessed 03/11/2022   4.  Patient will demonstrate Rt UE MMT 4/5 throughout to facilitate lifting, reaching, carrying at [facility] in daily activity.    Goal status: on going - assessed 03/11/2022   5.  Patient will demonstrate Rt GH joint AROM WFL s symptoms to facilitate usual overhead reaching, self care, dressing at PLOF.     Goal status: On Going 03/08/2022   6.  Patient will demonstrate/report ability to return to work at [employer].   Goal status: on going - assessed 03/11/2022       PLAN: PT FREQUENCY: 1-2x/week   PT DURATION: 10 weeks   PLANNED INTERVENTIONS: Therapeutic exercises, Therapeutic activity, Neuro Muscular re-education, Balance training, Gait training, Patient/Family education, Joint mobilization, Stair training, DME instructions, Dry Needling, Electrical stimulation, Traction, Cryotherapy, Moist heat, Taping, Ultrasound, Ionotophoresis '4mg'$ /ml Dexamethasone, and Manual therapy.  All included  unless contraindicated     PLAN FOR NEXT SESSION:  Pt to benefit from [facility] in gravity reduced positioning, early isometrics to recruit muscle performance   Scot Jun, PT, DPT, OCS, ATC 03/11/22  12:18 PM

## 2022-03-14 ENCOUNTER — Encounter: Payer: BC Managed Care – PPO | Admitting: Rehabilitative and Restorative Service Providers"

## 2022-03-18 ENCOUNTER — Encounter: Payer: BC Managed Care – PPO | Admitting: Rehabilitative and Restorative Service Providers"

## 2022-03-20 ENCOUNTER — Encounter: Payer: BC Managed Care – PPO | Admitting: Rehabilitative and Restorative Service Providers"

## 2022-03-22 ENCOUNTER — Encounter: Payer: Self-pay | Admitting: Rehabilitative and Restorative Service Providers"

## 2022-03-22 ENCOUNTER — Ambulatory Visit: Payer: BC Managed Care – PPO | Admitting: Rehabilitative and Restorative Service Providers"

## 2022-03-22 DIAGNOSIS — G8929 Other chronic pain: Secondary | ICD-10-CM | POA: Diagnosis not present

## 2022-03-22 DIAGNOSIS — M25511 Pain in right shoulder: Secondary | ICD-10-CM

## 2022-03-22 DIAGNOSIS — R600 Localized edema: Secondary | ICD-10-CM

## 2022-03-22 DIAGNOSIS — M6281 Muscle weakness (generalized): Secondary | ICD-10-CM

## 2022-03-22 NOTE — Therapy (Signed)
OUTPATIENT PHYSICAL THERAPY TREATMENT NOTE   Patient Name: Holly Hendrix MRN: [id number] DOB:27-Oct-1960, 61 y.o., female Today's Date: 03/22/2022  PCP: Francis Gaines B PA -C   REFERRING PROVIDER: Leandrew Koyanagi, MD  END OF SESSION:   PT End of Session - 03/22/22 1135     Visit Number 5    Number of Visits 20    Date for PT Re-Evaluation 05/07/22    Authorization Type [employer] [id number] copay    Progress Note Due on Visit 10    PT Start Time 1131    PT Stop Time 1210    PT Time Calculation (min) 39 min    Activity Tolerance Patient tolerated treatment well    Behavior During Therapy WFL for tasks assessed/performed                Past Medical History:  Diagnosis Date   Diabetes mellitus type 2, controlled, without complications ([place])    Diabetes type 2, controlled ([place]) 06/13/2016   Diet controlled    Elevated LDL cholesterol level    Headache    History of low potassium    Hyperlipidemia    Hypertension    PMB (postmenopausal bleeding)    Rotator cuff disorder, right    Past Surgical History:  Procedure Laterality Date   DILATATION & CURETTAGE/HYSTEROSCOPY WITH MYOSURE N/A 09/27/2019   Procedure: [place];  Surgeon: Megan Salon, MD;  Location: [facility];  Service: Gynecology;  Laterality: N/A;  to follow first case of AM   INDUCED ABORTION     SHOULDER ARTHROSCOPY WITH ROTATOR CUFF REPAIR AND SUBACROMIAL DECOMPRESSION Right 02/13/2022   Procedure: RIGHT SHOULDER ROTATOR CUFF REPAIR, EXTENSIVE DEBRIDEMENT;  Surgeon: Leandrew Koyanagi, MD;  Location: [facility];  Service: Orthopedics;  Laterality: Right;   TUBAL LIGATION     Patient Active Problem List   Diagnosis Date Noted   Complete tear of right rotator cuff 02/13/2022   Controlled type 2 diabetes mellitus without complication, without long-term current use of insulin ([place]) 07/26/2021   Hypokalemia 07/26/2021   Lymphocytosis 04/07/2019    Intramural uterine fibroid 02/14/2019   Chronic foot pain, left 03/16/2018   Enlarged thyroid 08/27/2017   Estrogen deficiency 08/27/2017   Obesity (BMI 30-39.9) 06/13/2016   Hyperlipidemia associated with type 2 diabetes mellitus ([place]) 07/29/2013   DENTAL CARIES 10/05/2009   INCONTINENCE, URGE 08/02/2009   HYPERTENSION, BENIGN ESSENTIAL 06/06/2008   ALLERGIC RHINITIS 06/06/2008     THERAPY DIAG:  Chronic right shoulder pain  Muscle weakness (generalized)  Localized edema    REFERRING DIAG: S46.011A (ICD-10-CM) - Traumatic complete tear of right rotator cuff, initial encounter   THERAPY DIAG:  Chronic right shoulder pain   Muscle weakness (generalized)   Localized edema   Rationale for Evaluation and Treatment Rehabilitation   ONSET DATE: surgery date 02/13/2022   SUBJECTIVE:  SUBJECTIVE STATEMENT: Pt indicated feeling no pain upon arrival today.  Indicated some occasional pains during day.  Reported some difficulty/pain at times in HEP.    PERTINENT HISTORY: DM, hyperlipidemia, HTN   PAIN:  NPRS scale: no pain a rest upon arrival.  Pain location: Rt shoulder  Pain description: pulling  Aggravating factors: lifting, reaching, using arm, some exercise Relieving factors: OTC medicine   PRECAUTIONS: None  (no repair of rotator cuff)   WEIGHT BEARING RESTRICTIONS No   FALLS:  Has patient fallen in last 6 months? 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    PLOF:  independent , Rt handed.  Goes to church, housework.     PATIENT GOALS    Reduce pain,    OBJECTIVE:    PATIENT SURVEYS:  02/26/2022 FOTO intake:  41   predicted:  61    COGNITION: 02/26/2022            Overall cognitive status: WFL                                     SENSATION: 02/26/2022 WFL   POSTURE: 02/26/2022 Rounded shoulders, sling on Rt arm.  Localized edema noted Rt shoulder   UPPER EXTREMITY ROM:    ROM Right 02/26/2022 Left 02/26/2022 Right 03/01/2022 Right 03/08/2022  Shoulder flexion Unable to perform AROM in sitting or supine due to pain/weakness   PROM 70 in supine c pain WFL 130 assessed supine 140 assessed supine  Shoulder extension        Shoulder abduction PROM 100 in supine c pain WFL    Shoulder horizontal adduction     20 assessed supine 20 assessed supine  Shoulder internal rotation PROM 30 deg in 45 deg abduction in supine c pain WFL 65 assessed supine at 70  degrees abduction 70 assessed supine at 70 degrees abduction  Shoulder external rotation PROM 50 deg in 45 deg abduction in supine c pain WFL 80 assessed supine at 70 degrees abduction 85 assessed supine at 70 degrees abduction  Elbow flexion        Elbow extension        Wrist flexion        Wrist extension        Wrist ulnar deviation        Wrist radial deviation        Wrist pronation        Wrist supination        (Blank rows = not tested)   UPPER EXTREMITY MMT:   MMT Right 02/26/2022 Left 02/26/2022 Right 03/22/2022  Shoulder flexion 1/5 c pain 5/5 2/5  Shoulder extension       Shoulder abduction 1/5 c pain 5/5   Shoulder adduction       Shoulder internal rotation 1/5 c pain 5/5   Shoulder external rotation 1/5 c pain 5/5 3/5  Middle trapezius       Lower trapezius       Elbow flexion 4/5 5/5   Elbow extension 4/5 5/5   Wrist flexion       Wrist extension       Wrist ulnar deviation       Wrist radial deviation       Wrist pronation  Wrist supination       Grip strength (lbs)       (Blank rows = not tested)   SPECIAL TESTS: 02/26/2022             (+) Shrug Rt in elevation against gravity.  (+) Drop arm Rt   JOINT MOBILITY  TESTING:  02/26/2022 Normal mobility within available range Rt GH joint.  Guarding noted c pain   PALPATION:  02/26/2022 Tenderness generally surrounding Rt shoulder in all areas.                TODAY'S TREATMENT: . 03/22/2022 Therex: Pulleys flexion, scaption to tolerance 5 sec holds 2 mins each way c outstretched arm to encourage eccentric lowering control Rt arm Supine AAROM wand 1 lb c Lt hand around midline of body - x 10, x 20 in available range (elbow extended) Supine protraction in 90 deg flexion AAROM c wand ( wand due to inability to perform steady unsupported) 5 sec hold x 10  Supine 90 deg flexion hold to fatigue against gravity 90 seconds Supine 90 deg flexion circles small clockwise, counter clockwise x 10 each way Sidelying Rt shoulder ER c towel at side , cues for avoid compensations 3 x 10  Seated scapular retraction x 10 2-3 sec hold  UBE fwd/back 3 mins each way Lvl 2.0 with 1 min rest break between directions.    03/11/2022 Therex: Pulleys flexion, scaption to tolerance 5 sec holds 3 mins each way Supine 90 deg flexion hold to fatigue against gravity approx. 15 seconds Supine AAROM wand 1 lb c Lt hand around midline of body - 2 x 10 in available range (elbow extended) Supine protraction in 90 deg flexion AAROM c wand ( added wand due to inability to perform steady unsupported) - adjusted for home) Sidelying Rt shoulder ER c towel at side , cues for avoid compensations 2 x 10  Standing Rt shoulder isometric 5 sec on/off x 12 for flexion, abduction   03/08/2022 [place] forward and back; clockwise (CW); counter clockwise (CCW); cross body 20X each AAROM and with Lt hand on table  Supine arm raises 2 sets of 20X AAROM 3 seconds (Lt hand stabilizes Rt elbow) with 1# Shoulder blade pinches 10X 5 seconds Shoulder ER Isometrics (to comfort) 10X 5 seconds Side-lie ER 2 sets of 10 slow eccentrics (lie Lt, Rt works) with 1# Pulley flexion and scaption with protraction 1st  (palm facing in) 10X 10 seconds each direction   03/01/2022 [place] forward and back; clockwise (CW); counter clockwise (CCW); cross body 20X each AAROM and with L hand on table Supine arm raises 10X AAROM 3 seconds Shoulder blade pinches 10X 5 seconds Shoulder ER Isometrics (to comfort) 10X 5 seconds  Manual PROM for flexion, IR, ER, horizontal adduction     PATIENT EDUCATION: 03/11/2022 Education details: HEP updates Person educated: Patient Education method: Explanation, Demonstration, Verbal cues, and Handouts Education comprehension: verbalized understanding, returned demonstration, and verbal cues required     HOME EXERCISE PROGRAM: Access Code: [email or web address] URL: [email or web address] Date: 03/11/2022 Prepared by: Scot Jun  Exercises - Seated Scapular Retraction  - 5 x daily - 7 x weekly - 1 sets - 5 reps - 5 seconds hold - Sidelying Shoulder External Rotation Dumbbell  - 2 x daily - 7 x weekly - 2 sets - 10 reps - 3 second hold - Seated Shoulder Flexion AAROM with Pulley Behind  - 2 x daily - 7 x weekly - 1 sets - 10  reps - 5-10 seconds hold - Supine Shoulder Protraction with Dowel  - 2 x daily - 7 x weekly - 1-2 sets - 10 reps - 3 hold - Supine Shoulder Flexion Extension AAROM with Dowel  - 2-3 x daily - 7 x weekly - 1-2 sets - 10-15 reps - 3 hold - Standing Isometric Shoulder Flexion with Doorway - Arm Bent  - 1-2 x daily - 7 x weekly - 1 sets - 10 reps - 5-10 hold - Standing Isometric Shoulder Abduction with Doorway - Arm Bent (Mirrored)  - 1-2 x daily - 7 x weekly - 1 sets - 10 reps - 5-10 hold  ASSESSMENT:   CLINICAL IMPRESSION: Gravity reduced AROM/AAROM showing slow but steady improvement.  Muscle strengthening most important at this time within tolerance.  Passive range continued to show fairly close to normal.      OBJECTIVE IMPAIRMENTS decreased coordination, decreased mobility, decreased ROM, decreased strength, hypomobility, increased  fascial restrictions, impaired perceived functional ability, impaired flexibility, impaired UE functional use, improper body mechanics, postural dysfunction, and pain.    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    REHAB POTENTIAL: Fair to good   CLINICAL DECISION MAKING: Stable/uncomplicated   EVALUATION COMPLEXITY: Low     GOALS: Goals reviewed with patient? Yes   Short term PT Goals (target date for Short term goals are 3 weeks 03/19/2022) Patient will demonstrate independent use of home exercise program to maintain progress from in clinic treatments. Goal status: Met    Long term PT goals (target dates for all long term goals are 10 weeks  05/07/2022 )   1. Patient will demonstrate/report pain at worst less than or equal to 2/10 to facilitate minimal limitation in daily activity secondary to pain symptoms. Goal status: On Going 03/08/2022   2. Patient will demonstrate independent use of home exercise program to facilitate ability to maintain/progress functional gains from skilled physical therapy services. Goal status: On Going 03/08/2022   3. Patient will demonstrate FOTO outcome > or = 61 % to indicate reduced disability due to condition. Goal status: on going - assessed 03/11/2022   4.  Patient will demonstrate Rt UE MMT 4/5 throughout to facilitate lifting, reaching, carrying at [facility] in daily activity.    Goal status: on going - assessed 03/11/2022   5.  Patient will demonstrate Rt GH joint AROM WFL s symptoms to facilitate usual overhead reaching, self care, dressing at PLOF.     Goal status: On Going 03/08/2022   6.  Patient will demonstrate/report ability to  return to work at [employer].   Goal status: on going - assessed 03/11/2022       PLAN: PT FREQUENCY: 1-2x/week   PT DURATION: 10 weeks   PLANNED INTERVENTIONS: Therapeutic exercises, Therapeutic activity, Neuro Muscular re-education, Balance training, Gait training, Patient/Family education, Joint mobilization, Stair training, DME instructions, Dry Needling, Electrical stimulation, Traction, Cryotherapy, Moist heat, Taping, Ultrasound, Ionotophoresis 89m/ml Dexamethasone, and Manual therapy.  All included unless contraindicated     PLAN FOR NEXT SESSION:  Gravity reduced AROM strengthening continued, isometrics in all directions.  Avoid shrug   MScot Jun PT, DPT, OCS, ATC 03/22/22  12:06 PM

## 2022-03-25 ENCOUNTER — Encounter: Payer: BC Managed Care – PPO | Admitting: Rehabilitative and Restorative Service Providers"

## 2022-03-27 ENCOUNTER — Telehealth: Payer: Self-pay | Admitting: Orthopaedic Surgery

## 2022-03-27 ENCOUNTER — Encounter: Payer: Self-pay | Admitting: Rehabilitative and Restorative Service Providers"

## 2022-03-27 ENCOUNTER — Other Ambulatory Visit: Payer: Self-pay | Admitting: Physician Assistant

## 2022-03-27 ENCOUNTER — Ambulatory Visit: Payer: BC Managed Care – PPO | Admitting: Rehabilitative and Restorative Service Providers"

## 2022-03-27 ENCOUNTER — Encounter: Payer: BC Managed Care – PPO | Admitting: Rehabilitative and Restorative Service Providers"

## 2022-03-27 DIAGNOSIS — R600 Localized edema: Secondary | ICD-10-CM | POA: Diagnosis not present

## 2022-03-27 DIAGNOSIS — M6281 Muscle weakness (generalized): Secondary | ICD-10-CM

## 2022-03-27 DIAGNOSIS — M25511 Pain in right shoulder: Secondary | ICD-10-CM

## 2022-03-27 DIAGNOSIS — G8929 Other chronic pain: Secondary | ICD-10-CM | POA: Diagnosis not present

## 2022-03-27 MED ORDER — HYDROCODONE-ACETAMINOPHEN 5-325 MG PO TABS: 1.0000 | ORAL_TABLET | Freq: Two times a day (BID) | ORAL | 0 refills | Status: DC | PRN

## 2022-03-27 NOTE — Telephone Encounter (Signed)
Notified patient.

## 2022-03-27 NOTE — Telephone Encounter (Signed)
Patient was here. She would like a refill on oxycodone.

## 2022-03-27 NOTE — Telephone Encounter (Signed)
Weaning to norco at this time and I just sent in

## 2022-03-27 NOTE — Therapy (Signed)
OUTPATIENT PHYSICAL THERAPY TREATMENT NOTE   Patient Name: Holly Hendrix MRN: [id number] DOB:Nov 28, 1960, 61 y.o., female Today's Date: 03/27/2022  PCP: Irene Pap PA -C   REFERRING PROVIDER: Leandrew Koyanagi, MD  END OF SESSION:   PT End of Session - 03/27/22 1257     Visit Number 6    Number of Visits 20    Date for PT Re-Evaluation 05/07/22    Authorization Type BC[employer] $72 copay    Progress Note Due on Visit 10    PT Start Time 4287    PT Stop Time 1340    PT Time Calculation (min) 43 min    Activity Tolerance Patient tolerated treatment well;No increased pain    Behavior During Therapy WFL for tasks assessed/performed                 Past Medical History:  Diagnosis Date   Diabetes mellitus type 2, controlled, without complications ([place])    Diabetes type 2, controlled ([place]) 06/13/2016   Diet controlled    Elevated LDL cholesterol level    Headache    History of low potassium    Hyperlipidemia    Hypertension    PMB (postmenopausal bleeding)    Rotator cuff disorder, right    Past Surgical History:  Procedure Laterality Date   DILATATION & CURETTAGE/HYSTEROSCOPY WITH MYOSURE N/A 09/27/2019   Procedure: [place];  Surgeon: Megan Salon, MD;  Location: [place];  Service: Gynecology;  Laterality: N/A;  to follow first case of AM   INDUCED ABORTION     SHOULDER ARTHROSCOPY WITH ROTATOR CUFF REPAIR AND SUBACROMIAL DECOMPRESSION Right 02/13/2022   Procedure: RIGHT SHOULDER ROTATOR CUFF REPAIR, EXTENSIVE DEBRIDEMENT;  Surgeon: Leandrew Koyanagi, MD;  Location: [place];  Service: Orthopedics;  Laterality: Right;   TUBAL LIGATION     Patient Active Problem List   Diagnosis Date Noted   Complete tear of right rotator cuff 02/13/2022   Controlled type 2 diabetes mellitus without complication, without long-term current use of insulin ([place]) 07/26/2021   Hypokalemia 07/26/2021    Lymphocytosis 04/07/2019   Intramural uterine fibroid 02/14/2019   Chronic foot pain, left 03/16/2018   Enlarged thyroid 08/27/2017   Estrogen deficiency 08/27/2017   Obesity (BMI 30-39.9) 06/13/2016   Hyperlipidemia associated with type 2 diabetes mellitus ([place]) 07/29/2013   DENTAL CARIES 10/05/2009   INCONTINENCE, URGE 08/02/2009   HYPERTENSION, BENIGN ESSENTIAL 06/06/2008   ALLERGIC RHINITIS 06/06/2008     THERAPY DIAG:  Chronic right shoulder pain  Muscle weakness (generalized)  Localized edema    REFERRING DIAG: S46.011A (ICD-10-CM) - Traumatic complete tear of right rotator cuff, initial encounter   THERAPY DIAG:  Chronic right shoulder pain   Muscle weakness (generalized)   Localized edema   Rationale for Evaluation and Treatment Rehabilitation   ONSET DATE: surgery date 02/13/2022   SUBJECTIVE:  SUBJECTIVE STATEMENT: Holly Hendrix last took a prescription pain pill earlier this week when she was out of her sling for > an hour at a funeral.  Sleep is 6+ hours per night.   PERTINENT HISTORY: DM, hyperlipidemia, HTN   PAIN:  NPRS scale: 0-5/10 this week Pain location: Rt shoulder  Pain description: Ache Aggravating factors: Lifting, reaching, using arm Relieving factors: Pain meds, exercises   PRECAUTIONS: None  (no repair of rotator cuff)   WEIGHT BEARING RESTRICTIONS No   FALLS:  Has patient fallen in last 6 months? 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    PLOF:  independent, Rt handed.  Goes to church, housework.     PATIENT GOALS    Reduce pain,    OBJECTIVE:    PATIENT SURVEYS:  02/26/2022 FOTO intake:  41   predicted:  61    COGNITION: 02/26/2022            Overall cognitive status: WFL                                     SENSATION: 02/26/2022 WFL   POSTURE: 02/26/2022 Rounded shoulders, sling on Rt arm.  Localized edema noted Rt shoulder   UPPER EXTREMITY ROM:    ROM Right 02/26/2022 Left 02/26/2022 Right 03/01/2022 Right 03/08/2022 Right 03/27/2022  Shoulder flexion Unable to perform AROM in sitting or supine due to pain/weakness   PROM 70 in supine c pain WFL 130 assessed supine 140 assessed supine 140 assessed supine  Shoulder extension         Shoulder abduction PROM 100 in supine c pain WFL     Shoulder horizontal adduction     20 assessed supine 20 assessed supine 20 assessed supine  Shoulder internal rotation PROM 30 deg in 45 deg abduction in supine c pain WFL 65 assessed supine at 70  degrees abduction 70 assessed supine at 70 degrees abduction 65 assessed supine at 70  degrees abduction  Shoulder external rotation PROM 50 deg in 45 deg abduction in supine c pain WFL 80 assessed supine at 70 degrees abduction 85 assessed supine at 70 degrees abduction 80 assessed supine at 70 degrees abduction  Elbow flexion         Elbow extension         Wrist flexion         Wrist extension         Wrist ulnar deviation         Wrist radial deviation         Wrist pronation         Wrist supination         (Blank rows = not tested)   UPPER EXTREMITY MMT:   MMT Right 02/26/2022 Left 02/26/2022 Right 03/22/2022  Shoulder flexion 1/5 c pain 5/5 2/5  Shoulder extension       Shoulder abduction 1/5 c pain 5/5   Shoulder adduction       Shoulder internal rotation 1/5 c pain 5/5   Shoulder external rotation 1/5 c pain 5/5 3/5  Middle trapezius       Lower trapezius       Elbow flexion 4/5 5/5   Elbow extension 4/5 5/5   Wrist flexion  Wrist extension       Wrist ulnar deviation       Wrist radial deviation       Wrist pronation       Wrist supination       Grip strength (lbs)        (Blank rows = not tested)   SPECIAL TESTS: 02/26/2022             (+) Shrug Rt in elevation against gravity.  (+) Drop arm Rt   JOINT MOBILITY TESTING:  02/26/2022 Normal mobility within available range Rt GH joint.  Guarding noted c pain   PALPATION:  02/26/2022 Tenderness generally surrounding Rt shoulder in all areas.                TODAY'S TREATMENT: . 03/27/2022 Codmans forward and back; clockwise (CW); counter clockwise (CCW); cross body 20X each AAROM and with L hand on table Supine arm raises 2 sets of 20 reps AAROM 3 seconds Shoulder blade pinches 2 sets of 10 reps 5 seconds Side lie ER 2 sets of 10 reps 5 seconds Pulley flexion and scaption with decompress (scapular protraction with palm in) 5X each 5 seconds   03/22/2022 Therex: Pulleys flexion, scaption to tolerance 5 sec holds 2 mins each way c outstretched arm to encourage eccentric lowering control Rt arm Supine AAROM wand 1 lb c Lt hand around midline of body - x 10, x 20 in available range (elbow extended) Supine protraction in 90 deg flexion AAROM c wand ( wand due to inability to perform steady unsupported) 5 sec hold x 10  Supine 90 deg flexion hold to fatigue against gravity 90 seconds Supine 90 deg flexion circles small clockwise, counter clockwise x 10 each way Sidelying Rt shoulder ER c towel at side , cues for avoid compensations 3 x 10  Seated scapular retraction x 10 2-3 sec hold  UBE fwd/back 3 mins each way Lvl 2.0 with 1 min rest break between directions.    03/11/2022 Therex: Pulleys flexion, scaption to tolerance 5 sec holds 3 mins each way Supine 90 deg flexion hold to fatigue against gravity approx. 15 seconds Supine AAROM wand 1 lb c Lt hand around midline of body - 2 x 10 in available range (elbow extended) Supine protraction in 90 deg flexion AAROM c wand ( added wand due to inability to perform steady unsupported) - adjusted for home) Sidelying Rt shoulder ER c towel at side , cues for  avoid compensations 2 x 10  Standing Rt shoulder isometric 5 sec on/off x 12 for flexion, abduction     PATIENT EDUCATION: 03/11/2022 Education details: HEP updates Person educated: Patient Education method: Explanation, Demonstration, Verbal cues, and Handouts Education comprehension: verbalized understanding, returned demonstration, and verbal cues required     HOME EXERCISE PROGRAM: Access Code: [email or web address] URL: [email or web address] Date: 03/27/2022 Prepared by: Vista Mink  Exercises - Seated Scapular Retraction  - 5 x daily - 7 x weekly - 1 sets - 5 reps - 5 seconds hold - Sidelying Shoulder External Rotation Dumbbell  - 2-3 x daily - 7 x weekly - 2 sets - 10 reps - 3 second hold - Seated Shoulder Flexion AAROM with Pulley Behind  - 2 x daily - 7 x weekly - 1 sets - 10 reps - 5-10 seconds hold - Supine Shoulder Protraction with Dowel  - 2 x daily - 7 x weekly - 1-2 sets - 10 reps - 3 hold - Supine Shoulder Flexion Extension  AAROM with Dowel  - 2-3 x daily - 7 x weekly - 1-2 sets - 10-15 reps - 3 hold - Standing Isometric Shoulder Flexion with Doorway - Arm Bent  - 1-2 x daily - 7 x weekly - 1 sets - 10 reps - 5-10 hold - Standing Isometric Shoulder Abduction with Doorway - Arm Bent (Mirrored)  - 1-2 x daily - 7 x weekly - 1 sets - 10 reps - 5-10 hold - Supine Scapular Protraction in Flexion with Dumbbells  - 2-3 x daily - 7 x weekly - 2 sets - 20 reps - 3 seconds hold  ASSESSMENT:   CLINICAL IMPRESSION: Agree that AROM is very good for this point post-surgery and strength is the focus of continued work.  Because no repair was done and we are 6 weeks post-arthroscopy, no additional restrictions on strength progressions.  Continue scapular with progression to rotator cuff strengthening next visit.     OBJECTIVE IMPAIRMENTS decreased coordination, decreased mobility, decreased ROM, decreased strength, hypomobility, increased fascial restrictions, impaired perceived  functional ability, impaired flexibility, impaired UE functional use, improper body mechanics, postural dysfunction, and pain.    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    REHAB POTENTIAL: Fair to good   CLINICAL DECISION MAKING: Stable/uncomplicated   EVALUATION COMPLEXITY: Low     GOALS: Goals reviewed with patient? Yes   Short term PT Goals (target date for Short term goals are 3 weeks 03/19/2022) Patient will demonstrate independent use of home exercise program to maintain progress from in clinic treatments. Goal status: Met    Long term PT goals (target dates for all long term goals are 10 weeks  05/07/2022 )   1. Patient will demonstrate/report pain at worst less than or equal to 2/10 to facilitate minimal limitation in daily activity secondary to pain symptoms. Goal status: On Going 03/27/2022   2. Patient will demonstrate independent use of home exercise program to facilitate ability to maintain/progress functional gains from skilled physical therapy services. Goal status: On Going 03/27/2022   3. Patient will demonstrate FOTO outcome > or = 61 % to indicate reduced disability due to condition. Goal status: On going - assessed 03/11/2022   4.  Patient will demonstrate Rt UE MMT 4/5 throughout to facilitate lifting, reaching, carrying at [facility] in daily activity.    Goal status: On going - assessed 03/11/2022   5.  Patient will demonstrate Rt GH joint AROM WFL s symptoms to facilitate usual overhead reaching, self care, dressing at PLOF.     Goal status: On Going 03/27/2022   6.  Patient will demonstrate/report ability to return to work at [employer].   Goal status: On going  - assessed 03/11/2022       PLAN: PT FREQUENCY: 1-2x/week   PT DURATION: 10 weeks   PLANNED INTERVENTIONS: Therapeutic exercises, Therapeutic activity, Neuro Muscular re-education, Balance training, Gait training, Patient/Family education, Joint mobilization, Stair training, DME instructions, Dry Needling, Electrical stimulation, Traction, Cryotherapy, Moist heat, Taping, Ultrasound, Ionotophoresis 76m/ml Dexamethasone, and Manual therapy.  All included unless contraindicated     PLAN FOR NEXT SESSION:  Strength emphasis (no repair).  Try IR/ER light theraband and UBE.   RFarley LyPT, MPT 03/27/22  5:30 PM

## 2022-03-29 ENCOUNTER — Ambulatory Visit: Payer: BC Managed Care – PPO | Admitting: Rehabilitative and Restorative Service Providers"

## 2022-03-29 ENCOUNTER — Other Ambulatory Visit: Payer: Self-pay | Admitting: Physician Assistant

## 2022-03-29 ENCOUNTER — Encounter: Payer: Self-pay | Admitting: Rehabilitative and Restorative Service Providers"

## 2022-03-29 DIAGNOSIS — M25511 Pain in right shoulder: Secondary | ICD-10-CM

## 2022-03-29 DIAGNOSIS — R600 Localized edema: Secondary | ICD-10-CM

## 2022-03-29 DIAGNOSIS — E1169 Type 2 diabetes mellitus with other specified complication: Secondary | ICD-10-CM

## 2022-03-29 DIAGNOSIS — G8929 Other chronic pain: Secondary | ICD-10-CM | POA: Diagnosis not present

## 2022-03-29 DIAGNOSIS — M6281 Muscle weakness (generalized): Secondary | ICD-10-CM | POA: Diagnosis not present

## 2022-03-29 NOTE — Therapy (Signed)
OUTPATIENT PHYSICAL THERAPY TREATMENT NOTE   Patient Name: Holly Hendrix MRN: [id number] DOB:[birth year], 61 y.o., female Today's Date: 03/29/2022  PCP: Holly Hendrix   REFERRING PROVIDER: Leandrew Koyanagi, MD  END OF SESSION:   PT End of Session - 03/29/22 1325     Visit Number 7    Number of Visits 20    Date for PT Re-Evaluation 05/07/22    Authorization Type BCBS $72 copay    Progress Note Due on Visit 10    PT Start Time 2355    PT Stop Time 1228    PT Time Calculation (min) 43 min    Activity Tolerance Patient tolerated treatment well;No increased pain    Behavior During Therapy WFL for tasks assessed/performed                  Past Medical History:  Diagnosis Date   Diabetes mellitus type 2, controlled, without complications ([place])    Diabetes type 2, controlled ([place]) 06/13/2016   Diet controlled    Elevated LDL cholesterol level    Headache    History of low potassium    Hyperlipidemia    Hypertension    PMB (postmenopausal bleeding)    Rotator cuff disorder, right    Past Surgical History:  Procedure Laterality Date   DILATATION & CURETTAGE/HYSTEROSCOPY WITH MYOSURE N/A 09/27/2019   Procedure: [place];  Surgeon: Holly Salon, MD;  Location: [place];  Service: Gynecology;  Laterality: N/A;  to follow first case of AM   INDUCED ABORTION     SHOULDER ARTHROSCOPY WITH ROTATOR CUFF REPAIR AND SUBACROMIAL DECOMPRESSION Right 02/13/2022   Procedure: RIGHT SHOULDER ROTATOR CUFF REPAIR, EXTENSIVE DEBRIDEMENT;  Surgeon: Holly Koyanagi, MD;  Location: [place];  Service: Orthopedics;  Laterality: Right;   TUBAL LIGATION     Patient Active Problem List   Diagnosis Date Noted   Complete tear of right rotator cuff 02/13/2022   Controlled type 2 diabetes mellitus without complication, without long-term current use of insulin ([place]) 07/26/2021   Hypokalemia 07/26/2021    Lymphocytosis 04/07/2019   Intramural uterine fibroid 02/14/2019   Chronic foot pain, left 03/16/2018   Enlarged thyroid 08/27/2017   Estrogen deficiency 08/27/2017   Obesity (BMI 30-39.9) 06/13/2016   Hyperlipidemia associated with type 2 diabetes mellitus ([place]) 07/29/2013   DENTAL CARIES 10/05/2009   INCONTINENCE, URGE 08/02/2009   HYPERTENSION, BENIGN ESSENTIAL 06/06/2008   ALLERGIC RHINITIS 06/06/2008     THERAPY DIAG:  Chronic right shoulder pain  Muscle weakness (generalized)  Localized edema    REFERRING DIAG: S46.011A (ICD-10-CM) - Traumatic complete tear of right rotator cuff, initial encounter   THERAPY DIAG:  Chronic right shoulder pain   Muscle weakness (generalized)   Localized edema   Rationale for Evaluation and Treatment Rehabilitation   ONSET DATE: surgery date 02/13/2022   SUBJECTIVE:  SUBJECTIVE STATEMENT: Holly Hendrix is sleeping better.  She does need an occasional hydrocodone.  She still has difficulty with reaching and overhead function.   PERTINENT HISTORY: DM, hyperlipidemia, HTN   PAIN:  NPRS scale: 2-5/10 this week Pain location: Rt shoulder  Pain description: Ache Aggravating factors: Lifting, reaching, using arm Relieving factors: Pain meds, exercises   PRECAUTIONS: None  (no repair of rotator cuff)   WEIGHT BEARING RESTRICTIONS No   FALLS:  Has patient fallen in last 6 months? 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    PLOF:  independent, Rt handed.  Goes to church, housework.     PATIENT GOALS    Reduce pain,    OBJECTIVE:    PATIENT SURVEYS:  02/26/2022 FOTO intake:  41   predicted:  61    COGNITION: 02/26/2022            Overall cognitive status: WFL                                     SENSATION: 02/26/2022 WFL   POSTURE: 02/26/2022 Rounded shoulders, sling on Rt arm.  Localized edema noted Rt shoulder   UPPER EXTREMITY ROM:    ROM Right 02/26/2022 Left 02/26/2022 Right 03/01/2022 Right 03/08/2022 Right 03/27/2022  Shoulder flexion Unable to perform AROM in sitting or supine due to pain/weakness   PROM 70 in supine c pain WFL 130 assessed supine 140 assessed supine 140 assessed supine  Shoulder extension         Shoulder abduction PROM 100 in supine c pain WFL     Shoulder horizontal adduction     20 assessed supine 20 assessed supine 20 assessed supine  Shoulder internal rotation PROM 30 deg in 45 deg abduction in supine c pain WFL 65 assessed supine at 70  degrees abduction 70 assessed supine at 70 degrees abduction 65 assessed supine at 70  degrees abduction  Shoulder external rotation PROM 50 deg in 45 deg abduction in supine c pain WFL 80 assessed supine at 70 degrees abduction 85 assessed supine at 70 degrees abduction 80 assessed supine at 70 degrees abduction  Elbow flexion         Elbow extension         Wrist flexion         Wrist extension         Wrist ulnar deviation         Wrist radial deviation         Wrist pronation         Wrist supination         (Blank rows = not tested)   UPPER EXTREMITY MMT:   MMT Right 02/26/2022 Left 02/26/2022 Right 03/22/2022  Shoulder flexion 1/5 c pain 5/5 2/5  Shoulder extension       Shoulder abduction 1/5 c pain 5/5   Shoulder adduction       Shoulder internal rotation 1/5 c pain 5/5   Shoulder external rotation 1/5 c pain 5/5 3/5  Middle trapezius       Lower trapezius       Elbow flexion 4/5 5/5   Elbow extension 4/5 5/5   Wrist flexion       Wrist extension       Wrist  ulnar deviation       Wrist radial deviation       Wrist pronation       Wrist supination       Grip strength (lbs)        (Blank rows = not tested)   SPECIAL TESTS: 02/26/2022             (+) Shrug Rt in elevation against gravity.  (+) Drop arm Rt   JOINT MOBILITY TESTING:  02/26/2022 Normal mobility within available range Rt GH joint.  Guarding noted c pain   PALPATION:  02/26/2022 Tenderness generally surrounding Rt shoulder in all areas.                TODAY'S TREATMENT: . 03/29/2022 Supine arm raises 2 sets of 20 reps AAROM 3 seconds with 1#, 1 set protract only, 1 set 1/2 range Shoulder blade pinches 2 sets of 10 reps 5 seconds Side lie ER 2 sets of 10 reps 5 seconds with 1# Pulley flexion and scaption with decompress (scapular protraction with palm in) 10X each 5 seconds Theraband rows with green theraband 20X 3 seconds Theraband ER Red 2 sets of 10 slow eccentrics Theraband IR Red 2 sets of 10 slow eccentrics  Ice Rt shoulder 8 minutes while updating and discussing HEP   03/27/2022 Codmans forward and back; clockwise (CW); counter clockwise (CCW); cross body 20X each AAROM and with L hand on table Supine arm raises 2 sets of 20 reps AAROM 3 seconds Shoulder blade pinches 2 sets of 10 reps 5 seconds Side lie ER 2 sets of 10 reps 5 seconds Pulley flexion and scaption with decompress (scapular protraction with palm in) 5X each 5 seconds   03/22/2022 Therex: Pulleys flexion, scaption to tolerance 5 sec holds 2 mins each way c outstretched arm to encourage eccentric lowering control Rt arm Supine AAROM wand 1 lb c Lt hand around midline of body - x 10, x 20 in available range (elbow extended) Supine protraction in 90 deg flexion AAROM c wand ( wand due to inability to perform steady unsupported) 5 sec hold x 10  Supine 90 deg flexion hold to fatigue against gravity 90 seconds Supine 90 deg flexion circles small clockwise, counter clockwise x 10 each way Sidelying Rt shoulder ER c towel at side , cues for avoid compensations 3 x 10  Seated scapular retraction x 10 2-3 sec hold  UBE fwd/back  3 mins each way Lvl 2.0 with 1 min rest break between directions.      PATIENT EDUCATION: 03/11/2022 Education details: HEP updates Person educated: Patient Education method: Explanation, Demonstration, Verbal cues, and Handouts Education comprehension: verbalized understanding, returned demonstration, and verbal cues required     HOME EXERCISE PROGRAM: Access Code: [email or web address] URL: [email or web address] Date: 03/27/2022 Prepared by: Vista Mink  Exercises - Seated Scapular Retraction  - 5 x daily - 7 x weekly - 1 sets - 5 reps - 5 seconds hold - Sidelying Shoulder External Rotation Dumbbell  - 2-3 x daily - 7 x weekly - 2 sets - 10 reps - 3 second hold - Seated Shoulder Flexion AAROM with Pulley Behind  - 2 x daily - 7 x weekly - 1 sets - 10 reps - 5-10 seconds hold - Supine Shoulder Protraction with Dowel  - 2 x daily - 7 x weekly - 1-2 sets - 10 reps - 3 hold - Supine Shoulder Flexion Extension AAROM with Dowel  - 2-3 x daily - 7 x weekly -  1-2 sets - 10-15 reps - 3 hold - Standing Isometric Shoulder Flexion with Doorway - Arm Bent  - 1-2 x daily - 7 x weekly - 1 sets - 10 reps - 5-10 hold - Standing Isometric Shoulder Abduction with Doorway - Arm Bent (Mirrored)  - 1-2 x daily - 7 x weekly - 1 sets - 10 reps - 5-10 hold - Supine Scapular Protraction in Flexion with Dumbbells  - 2-3 x daily - 7 x weekly - 2 sets - 20 reps - 3 seconds hold  ASSESSMENT:   CLINICAL IMPRESSION: [place] has very good AROM and poor strength.  We progressed some strengthening activities in the clinic today with progressions also to her HEP.  With additional time and work, I still anticipate she will meet all long-term goals.     OBJECTIVE IMPAIRMENTS decreased coordination, decreased mobility, decreased ROM, decreased strength, hypomobility, increased fascial restrictions, impaired perceived functional ability, impaired flexibility, impaired UE functional use, improper body mechanics,  postural dysfunction, and pain.    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    REHAB POTENTIAL: Fair to good   CLINICAL DECISION MAKING: Stable/uncomplicated   EVALUATION COMPLEXITY: Low     GOALS: Goals reviewed with patient? Yes   Short term PT Goals (target date for Short term goals are 3 weeks 03/19/2022) Patient will demonstrate independent use of home exercise program to maintain progress from in clinic treatments. Goal status: Met    Long term PT goals (target dates for all long term goals are 10 weeks  05/07/2022 )   1. Patient will demonstrate/report pain at worst less than or equal to 2/10 to facilitate minimal limitation in daily activity secondary to pain symptoms. Goal status: On Going 03/29/2022   2. Patient will demonstrate independent use of home exercise program to facilitate ability to maintain/progress functional gains from skilled physical therapy services. Goal status: On Going 03/29/2022   3. Patient will demonstrate FOTO outcome > or = 61 % to indicate reduced disability due to condition. Goal status: On going - assessed 03/11/2022   4.  Patient will demonstrate Rt UE MMT 4/5 throughout to facilitate lifting, reaching, carrying at [facility] in daily activity.    Goal status: On going - assessed 03/11/2022   5.  Patient will demonstrate Rt GH joint AROM WFL s symptoms to facilitate usual overhead reaching, self care, dressing at PLOF.     Goal status: On Going 03/29/2022   6.  Patient will demonstrate/report ability to return to work at [employer].   Goal status: On going - assessed 03/29/2022       PLAN: PT FREQUENCY: 1-2x/week   PT DURATION: 10 weeks    PLANNED INTERVENTIONS: Therapeutic exercises, Therapeutic activity, Neuro Muscular re-education, Balance training, Gait training, Patient/Family education, Joint mobilization, Stair training, DME instructions, Dry Needling, Electrical stimulation, Traction, Cryotherapy, Moist heat, Taping, Ultrasound, Ionotophoresis 4m/ml Dexamethasone, and Manual therapy.  All included unless contraindicated     PLAN FOR NEXT SESSION:  Strength emphasis (no repair).  Progress theraband, resistive strength as appropriate and UBE.   RFarley LyPT, MPT 03/29/22  1:30 PM

## 2022-04-02 ENCOUNTER — Encounter: Payer: Self-pay | Admitting: Internal Medicine

## 2022-04-04 ENCOUNTER — Ambulatory Visit: Payer: BC Managed Care – PPO | Admitting: Rehabilitative and Restorative Service Providers"

## 2022-04-04 ENCOUNTER — Encounter: Payer: Self-pay | Admitting: Rehabilitative and Restorative Service Providers"

## 2022-04-04 DIAGNOSIS — R600 Localized edema: Secondary | ICD-10-CM | POA: Diagnosis not present

## 2022-04-04 DIAGNOSIS — M25511 Pain in right shoulder: Secondary | ICD-10-CM | POA: Diagnosis not present

## 2022-04-04 DIAGNOSIS — M6281 Muscle weakness (generalized): Secondary | ICD-10-CM | POA: Diagnosis not present

## 2022-04-04 DIAGNOSIS — G8929 Other chronic pain: Secondary | ICD-10-CM

## 2022-04-04 NOTE — Therapy (Addendum)
OUTPATIENT PHYSICAL THERAPY TREATMENT NOTE [place]   Patient Name: Holly Hendrix MRN: [id number] DOB:12-Apr-1961, 61 y.o., female Today's Date: 04/04/2022  PCP: Irene Pap PA -C   REFERRING PROVIDER: Leandrew Koyanagi, MD  END OF SESSION:   PT End of Session - 04/04/22 1345     Visit Number 8    Number of Visits 20    Date for PT Re-Evaluation 05/07/22    Authorization Type [employer] $72 copay    Progress Note Due on Visit 10    PT Start Time 4847    PT Stop Time 1438    PT Time Calculation (min) 53 min    Activity Tolerance Patient tolerated treatment well;No increased pain    Behavior During Therapy [facility] for tasks assessed/performed             Progress Note Reporting Period 02/26/2022 to 04/04/2022  See note below for Objective Data and Assessment of Progress/Goals.     Past Medical History:  Diagnosis Date   Diabetes mellitus type 2, controlled, without complications ([place])    Diabetes type 2, controlled ([place]) 06/13/2016   Diet controlled    Elevated LDL cholesterol level    Headache    History of low potassium    Hyperlipidemia    Hypertension    PMB (postmenopausal bleeding)    Rotator cuff disorder, right    Past Surgical History:  Procedure Laterality Date   DILATATION & CURETTAGE/HYSTEROSCOPY WITH MYOSURE N/A 09/27/2019   Procedure: [place];  Surgeon: Megan Salon, MD;  Location: [place];  Service: Gynecology;  Laterality: N/A;  to follow first case of AM   INDUCED ABORTION     SHOULDER ARTHROSCOPY WITH ROTATOR CUFF REPAIR AND SUBACROMIAL DECOMPRESSION Right 02/13/2022   Procedure: RIGHT SHOULDER ROTATOR CUFF REPAIR, EXTENSIVE DEBRIDEMENT;  Surgeon: Leandrew Koyanagi, MD;  Location: [place];  Service: Orthopedics;  Laterality: Right;   TUBAL LIGATION     Patient Active Problem List   Diagnosis Date Noted   Complete tear of right rotator cuff 02/13/2022   Controlled type 2  diabetes mellitus without complication, without long-term current use of insulin ([place]) 07/26/2021   Hypokalemia 07/26/2021   Lymphocytosis 04/07/2019   Intramural uterine fibroid 02/14/2019   Chronic foot pain, left 03/16/2018   Enlarged thyroid 08/27/2017   Estrogen deficiency 08/27/2017   Obesity (BMI 30-39.9) 06/13/2016   Hyperlipidemia associated with type 2 diabetes mellitus ([place]) 07/29/2013   DENTAL CARIES 10/05/2009   INCONTINENCE, URGE 08/02/2009   HYPERTENSION, BENIGN ESSENTIAL 06/06/2008   ALLERGIC RHINITIS 06/06/2008     THERAPY DIAG:  Chronic right shoulder pain  Muscle weakness (generalized)  Localized edema    REFERRING DIAG: S46.011A (ICD-10-CM) - Traumatic complete tear of right rotator cuff, initial encounter   THERAPY DIAG:  Chronic right shoulder pain   Muscle weakness (generalized)   Localized edema   Rationale for Evaluation and Treatment Rehabilitation   ONSET DATE: surgery date 02/13/2022   SUBJECTIVE:  SUBJECTIVE STATEMENT: Elodie is sleeping fine as long as she is in the right position.  3-4 hydrocodone needed this week.  She still has difficulty with reaching and overhead function.   PERTINENT HISTORY: DM, hyperlipidemia, HTN   PAIN:  NPRS scale: 2-5/10 this week Pain location: Rt shoulder  Pain description: Ache Aggravating factors: Lifting, reaching, using arm Relieving factors: Pain meds, exercises   PRECAUTIONS: None  (no repair of rotator cuff)   WEIGHT BEARING RESTRICTIONS No   FALLS:  Has patient fallen in last 6 months? 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    PLOF:  independent, Rt handed.  Goes to church,  housework.     PATIENT GOALS    Reduce pain,    OBJECTIVE:    PATIENT SURVEYS:  04/04/2022  FOTO 48 (was 41, Goal 61)  02/26/2022 FOTO intake:  41   predicted:  61   COGNITION: 02/26/2022            Overall cognitive status: WFL                                     SENSATION: 02/26/2022 WFL   POSTURE: 02/26/2022 Rounded shoulders, sling on Rt arm.  Localized edema noted Rt shoulder   UPPER EXTREMITY ROM:    ROM Right 02/26/2022 Left 02/26/2022 Right 03/01/2022 Right 03/08/2022 Right 03/27/2022 Right 04/04/2022  Shoulder flexion Unable to perform AROM in sitting or supine due to pain/weakness   PROM 70 in supine c pain WFL 130 assessed supine 140 assessed supine 140 assessed supine 140 supine  Shoulder extension          Shoulder abduction PROM 100 in supine c pain WFL      Shoulder horizontal adduction     20 assessed supine 20 assessed supine 20 assessed supine 30 supine  Shoulder internal rotation PROM 30 deg in 45 deg abduction in supine c pain WFL 65 assessed supine at 70  degrees abduction 70 assessed supine at 70 degrees abduction 65 assessed supine at 70  degrees abduction 65 assessed supine at 70  degrees abduction  Shoulder external rotation PROM 50 deg in 45 deg abduction in supine c pain WFL 80 assessed supine at 70 degrees abduction 85 assessed supine at 70 degrees abduction 80 assessed supine at 70 degrees abduction 80 assessed supine at 70  degrees abduction  Elbow flexion          Elbow extension          Wrist flexion          Wrist extension          Wrist ulnar deviation          Wrist radial deviation          Wrist pronation          Wrist supination          (Blank rows = not tested)   UPPER EXTREMITY MMT:   MMT Right 02/26/2022 Left 02/26/2022 Right 03/22/2022 Left/Right in pounds  Shoulder flexion 1/5 c pain 5/5 2/5   Shoulder extension        Shoulder abduction 1/5 c pain 5/5    Shoulder adduction        Shoulder internal rotation 1/5 c  pain  5/5  24.7/21.8  Shoulder external rotation 1/5 c pain 5/5 3/5 13.1/7.8  Middle trapezius        Lower trapezius        Elbow flexion 4/5 5/5    Elbow extension 4/5 5/5    Wrist flexion        Wrist extension        Wrist ulnar deviation        Wrist radial deviation        Wrist pronation        Wrist supination        Grip strength (lbs)        (Blank rows = not tested)   SPECIAL TESTS: 02/26/2022             (+) Shrug Rt in elevation against gravity.  (+) Drop arm Rt   JOINT MOBILITY TESTING:  02/26/2022 Normal mobility within available range Rt GH joint.  Guarding noted c pain   PALPATION:  02/26/2022 Tenderness generally surrounding Rt shoulder in all areas.                TODAY'S TREATMENT: . 04/04/2022 UBE forward/back/rest :20 intervals for 5 minutes Level 4 to 5 Supine arm raises 2 sets of 20 reps AAROM 3 seconds with 1#, 1 set 1/2 range & 1 set full range (Protract emphasis) Shoulder blade pinches 10 reps 5 seconds Side lie ER 2 sets of 10 reps 5 seconds with 1# Pulley flexion and scaption with decompress (scapular protraction with palm in) 10X each 5 seconds Theraband rows with green theraband 20X 3 seconds Theraband ER Red 2 sets of 10 slow eccentrics Theraband IR Red 2 sets of 10 slow eccentrics   03/29/2022 Supine arm raises 2 sets of 20 reps AAROM 3 seconds with 1#, 1 set protract only, 1 set 1/2 range Shoulder blade pinches 2 sets of 10 reps 5 seconds Side lie ER 2 sets of 10 reps 5 seconds with 1# Pulley flexion and scaption with decompress (scapular protraction with palm in) 10X each 5 seconds Theraband rows with green theraband 20X 3 seconds Theraband ER Red 2 sets of 10 slow eccentrics Theraband IR Red 2 sets of 10 slow eccentrics  Ice Rt shoulder 8 minutes while updating and discussing HEP   03/27/2022 Codmans forward and back; clockwise (CW); counter clockwise (CCW); cross body 20X each AAROM and with L hand on table Supine arm raises 2  sets of 20 reps AAROM 3 seconds Shoulder blade pinches 2 sets of 10 reps 5 seconds Side lie ER 2 sets of 10 reps 5 seconds Pulley flexion and scaption with decompress (scapular protraction with palm in) 5X each 5 seconds    PATIENT EDUCATION: 03/11/2022 Education details: HEP updates Person educated: Patient Education method: Explanation, Demonstration, Verbal cues, and Handouts Education comprehension: verbalized understanding, returned demonstration, and verbal cues required     HOME EXERCISE PROGRAM: Access Code: [email or web address] URL: [email or web address] Date: 04/04/2022 Prepared by: Vista Mink  Exercises - Seated Scapular Retraction  - 5 x daily - 7 x weekly - 1 sets - 5 reps - 5 seconds hold - Sidelying Shoulder External Rotation Dumbbell  - 2 x daily - 3 x weekly - 2 sets - 10 reps - 3 second hold - Seated Shoulder Flexion AAROM with Pulley Behind  - 2 x daily - 7 x weekly - 1 sets - 10 reps - 5-10 seconds hold - Supine Shoulder Protraction with Dowel  - 2 x daily - 1  x weekly - 1-2 sets - 10 reps - 3 hold - Supine Shoulder Flexion Extension AAROM with Dowel  - 2-3 x daily - 1 x weekly - 1-2 sets - 10-15 reps - 3 hold - Standing Isometric Shoulder Flexion with Doorway - Arm Bent  - 1-2 x daily - 1 x weekly - 1 sets - 10 reps - 5-10 hold - Standing Isometric Shoulder Abduction with Doorway - Arm Bent (Mirrored)  - 1-2 x daily - 1 x weekly - 1 sets - 10 reps - 5-10 hold - Supine Scapular Protraction in Flexion with Dumbbells  - 2 x daily - 7 x weekly - 2 sets - 20 reps - 3 seconds hold - Standing Row with Anchored Resistance Band with PLB  - 1 x daily - 3 x weekly - 1 sets - 20 reps - 3 seconds hold - Shoulder External Rotation with Anchored Resistance  - 1 x daily - 3 x weekly - 2 sets - 10 reps - 3 hold - Shoulder Internal Rotation with Resistance  - 1 x daily - 3 x weekly - 2 sets - 10 reps - 3 hold   ASSESSMENT:   CLINICAL IMPRESSION: Zlaty has very good AROM  and poor strength.  We progressed some strengthening activities in the clinic today with progressions also to her HEP.  With additional time and work, I still anticipate she will meet all long-term goals.  Because she is getting more comfortable with her HEP (and because she is not yet able to raise her hand above shoulder level), she will continue mostly independently with a follow-up about 1X/month (as needed) over the next 2-3 months.     OBJECTIVE IMPAIRMENTS decreased coordination, decreased mobility, decreased ROM, decreased strength, hypomobility, increased fascial restrictions, impaired perceived functional ability, impaired flexibility, impaired UE functional use, improper body mechanics, postural dysfunction, and pain.    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    REHAB POTENTIAL: Fair to good   CLINICAL DECISION MAKING: Stable/uncomplicated   EVALUATION COMPLEXITY: Low     GOALS: Goals reviewed with patient? Yes   Short term PT Goals (target date for Short term goals are 3 weeks 03/19/2022) Patient will demonstrate independent use of home exercise program to maintain progress from in clinic treatments. Goal status: Met    Long term PT goals (target dates for all long term goals are 10 weeks  05/07/2022 )   1. Patient will demonstrate/report pain at worst less than or equal to 2/10 to facilitate minimal limitation in daily activity secondary to pain symptoms. Goal status: On Going 04/04/2022   2. Patient will demonstrate independent use of home exercise program to facilitate ability to maintain/progress functional gains from skilled physical therapy services. Goal status:  On Going 04/04/2022   3. Patient will demonstrate FOTO outcome > or = 61 % to indicate reduced disability due to condition. Goal status: On going - assessed 04/04/2022   4.  Patient will demonstrate Rt UE MMT 4/5 throughout to facilitate lifting, reaching, carrying at [facility] in daily activity.    Goal status: On going - assessed 04/04/2022   5.  Patient will demonstrate Rt GH joint AROM WFL s symptoms to facilitate usual overhead reaching, self care, dressing at PLOF.  Goal status: On Going 04/04/2022   6.  Patient will demonstrate/report ability to return to work at [employer].   Goal status: On going - assessed 04/04/2022       PLAN: PT FREQUENCY: 1-2x/month   PT DURATION: 3 months   PLANNED INTERVENTIONS: Therapeutic exercises, Therapeutic activity, Neuro Muscular re-education, Balance training, Gait training, Patient/Family education, Joint mobilization, Stair training, DME instructions, Dry Needling, Electrical stimulation, Traction, Cryotherapy, Moist heat, Taping, Ultrasound, Ionotophoresis 52m/ml Dexamethasone, and Manual therapy.  All included unless contraindicated     PLAN FOR NEXT SESSION:  Strength emphasis (no repair).  Progress theraband, resistive strength as appropriate and UBE.  Prepare KIssafor completely independent rehabilitation when appropriate.   RFarley LyPT, MPT 04/04/22  4:12 PM    PHYSICAL THERAPY DISCHARGE SUMMARY  Visits from Start of Care: 8  Current functional level related to goals / functional outcomes: See note   Remaining deficits: See note   Education / Equipment: HEP  Patient goals were partially met. Patient is being discharged due to not returning since the last visit.  MScot Jun PT, DPT, OCS, ATC 05/09/22  3:16 PM

## 2022-04-09 ENCOUNTER — Telehealth: Payer: Self-pay | Admitting: Orthopaedic Surgery

## 2022-04-09 NOTE — Telephone Encounter (Signed)
Patient called. She would like a work note. Her call back number is [phone]

## 2022-04-09 NOTE — Telephone Encounter (Signed)
Spoke with patient. She is overdue for follow up. There was an opening tomorrow.  I have scheduled her to come in and she can get a new work note at that time.

## 2022-04-10 ENCOUNTER — Ambulatory Visit (INDEPENDENT_AMBULATORY_CARE_PROVIDER_SITE_OTHER): Payer: BC Managed Care – PPO | Admitting: Orthopaedic Surgery

## 2022-04-10 DIAGNOSIS — M25512 Pain in left shoulder: Secondary | ICD-10-CM

## 2022-04-10 DIAGNOSIS — G8929 Other chronic pain: Secondary | ICD-10-CM | POA: Diagnosis not present

## 2022-04-10 DIAGNOSIS — S46011A Strain of muscle(s) and tendon(s) of the rotator cuff of right shoulder, initial encounter: Secondary | ICD-10-CM | POA: Diagnosis not present

## 2022-04-10 HISTORY — DX: Other chronic pain: G89.29

## 2022-04-10 NOTE — Telephone Encounter (Signed)
Pt informed that she is out for 6 weeks

## 2022-04-10 NOTE — Telephone Encounter (Signed)
Pt called in stating that she forgot to ask Dr. Erlinda Hong when was her return back to work date... Pt stated that Dr. Erlinda Hong is suppose to fax over a letter to her employer... Pt stated that she didn't look at the letter to see what it say... Pt Requesting callback.Marland KitchenMarland Kitchen

## 2022-04-10 NOTE — Progress Notes (Signed)
Post-Op Visit Note   Patient: Holly Hendrix           Date of Birth: 1961-03-18           MRN: [id number] Visit Date: 04/10/2022 PCP: Irene Pap, PA-C   Assessment & Plan:  Chief Complaint:  Chief Complaint  Patient presents with   Right Shoulder - Pain   Visit Diagnoses:  1. Traumatic complete tear of right rotator cuff, initial encounter   2. Chronic left shoulder pain     Plan: Shaunta returns today approximately 5 weeks status post right shoulder scope.  She is currently doing physical therapy once a week.  She feels weakness in her hand and her neck.  Her left shoulder is also bothering her and has been bothering her since she had a fall while back.  We have been treating this symptomatically but this has not improved.  Examination of the right shoulder shows a fully healed surgical scars.  She does have 2 out of 5 strength with forward flexion, abduction, external rotation, internal rotation.  Her left shoulder shows pain and 4 out of 5 strength with empty can.  No pain with impingement.  In regards to the right shoulder she is still quite weak and needs more physical therapy.  She is not ready to go back to work at this time.  I have written her out for another 6 weeks.  Work note was faxed to her job today.  For the left shoulder given continued symptoms despite conservative management we will need an MRI to rule out structural abnormalities.  Follow-up after the MRI.  Follow-Up Instructions: No follow-ups on file.   Orders:  No orders of the defined types were placed in this encounter.  No orders of the defined types were placed in this encounter.   Imaging: No results found.  PMFS History: Patient Active Problem List   Diagnosis Date Noted   Chronic left shoulder pain 04/10/2022   Complete tear of right rotator cuff 02/13/2022   Controlled type 2 diabetes mellitus without complication, without long-term current use of insulin ([place]) 07/26/2021    Hypokalemia 07/26/2021   Lymphocytosis 04/07/2019   Intramural uterine fibroid 02/14/2019   Chronic foot pain, left 03/16/2018   Enlarged thyroid 08/27/2017   Estrogen deficiency 08/27/2017   Obesity (BMI 30-39.9) 06/13/2016   Hyperlipidemia associated with type 2 diabetes mellitus ([place]) 07/29/2013   DENTAL CARIES 10/05/2009   INCONTINENCE, URGE 08/02/2009   HYPERTENSION, BENIGN ESSENTIAL 06/06/2008   ALLERGIC RHINITIS 06/06/2008   Past Medical History:  Diagnosis Date   Diabetes mellitus type 2, controlled, without complications ([place])    Diabetes type 2, controlled ([place]) 06/13/2016   Diet controlled    Elevated LDL cholesterol level    Headache    History of low potassium    Hyperlipidemia    Hypertension    PMB (postmenopausal bleeding)    Rotator cuff disorder, right     Family History  Problem Relation Age of Onset   Arthritis Sister    Diabetes Sister    Hypertension Sister    Cancer Father        unknown    Arthritis Brother    Ulcers Brother    Diabetes Sister    Hypertension Sister    Arthritis Sister    Colon cancer Neg Hx     Past Surgical History:  Procedure Laterality Date   DILATATION & CURETTAGE/HYSTEROSCOPY WITH MYOSURE N/A 09/27/2019   Procedure: DILATATION &  CURETTAGE/HYSTEROSCOPY WITH MYOSURE;  Surgeon: Megan Salon, MD;  Location: [facility];  Service: Gynecology;  Laterality: N/A;  to follow first case of AM   INDUCED ABORTION     SHOULDER ARTHROSCOPY WITH ROTATOR CUFF REPAIR AND SUBACROMIAL DECOMPRESSION Right 02/13/2022   Procedure: RIGHT SHOULDER ROTATOR CUFF REPAIR, EXTENSIVE DEBRIDEMENT;  Surgeon: Leandrew Koyanagi, MD;  Location: [place];  Service: Orthopedics;  Laterality: Right;   TUBAL LIGATION     Social History   Occupational History   Not on file  Tobacco Use   Smoking status: Never   Smokeless tobacco: Never  Vaping Use   Vaping Use: Never used  Substance and Sexual Activity   Alcohol use: No    Drug use: No   Sexual activity: Not Currently    Birth control/protection: Post-menopausal, Surgical    Comment: BTL

## 2022-05-03 ENCOUNTER — Encounter: Payer: BC Managed Care – PPO | Admitting: Rehabilitative and Restorative Service Providers"

## 2022-05-04 ENCOUNTER — Other Ambulatory Visit: Payer: BC Managed Care – PPO

## 2022-05-18 ENCOUNTER — Other Ambulatory Visit: Payer: Self-pay | Admitting: Medical

## 2022-05-18 DIAGNOSIS — I10 Essential (primary) hypertension: Secondary | ICD-10-CM

## 2022-05-18 DIAGNOSIS — E1169 Type 2 diabetes mellitus with other specified complication: Secondary | ICD-10-CM

## 2022-05-18 DIAGNOSIS — E119 Type 2 diabetes mellitus without complications: Secondary | ICD-10-CM

## 2022-05-22 ENCOUNTER — Other Ambulatory Visit: Payer: Self-pay | Admitting: Medical

## 2022-05-22 DIAGNOSIS — E876 Hypokalemia: Secondary | ICD-10-CM

## 2022-05-24 ENCOUNTER — Telehealth: Payer: Self-pay | Admitting: Orthopaedic Surgery

## 2022-05-24 NOTE — Telephone Encounter (Signed)
Faxed to number above.

## 2022-05-24 NOTE — Telephone Encounter (Signed)
Patient called in stating her job informed her that they did not receive a letter that was supposed to be faxed over about how long she was going to be out of work both notes are in the Chart  Fax number [phone] please advise

## 2022-05-28 ENCOUNTER — Ambulatory Visit: Payer: BC Managed Care – PPO | Admitting: Orthopaedic Surgery

## 2022-05-30 ENCOUNTER — Telehealth: Payer: Self-pay | Admitting: Orthopaedic Surgery

## 2022-05-30 NOTE — Telephone Encounter (Signed)
[employer] forms received. To [employer].

## 2022-06-05 ENCOUNTER — Telehealth: Payer: Self-pay | Admitting: Orthopaedic Surgery

## 2022-06-05 NOTE — Telephone Encounter (Signed)
Insurance company wanting to get dates on surgery for patient.please advise Holly Hendrix) [place]-- [phone].

## 2022-06-06 ENCOUNTER — Ambulatory Visit: Payer: BC Managed Care – PPO | Admitting: Physician Assistant

## 2022-06-06 DIAGNOSIS — Z9889 Other specified postprocedural states: Secondary | ICD-10-CM | POA: Diagnosis not present

## 2022-06-06 NOTE — Telephone Encounter (Signed)
Fax received. Will process.

## 2022-06-06 NOTE — Progress Notes (Signed)
Post-Op Visit Note   Patient: Holly Hendrix           Date of Birth: 07/30/1960           MRN: [id number] Visit Date: 06/06/2022 PCP: Irene Pap, PA-C   Assessment & Plan:  Chief Complaint:  Chief Complaint  Patient presents with   Right Shoulder - Follow-up   Visit Diagnoses:  1. S/P arthroscopy of right shoulder     Plan: Patient is a pleasant 62 year old female who comes in today approximately 4 months status post right shoulder arthroscopic debridement of irreparable rotator cuff, date of surgery 02/13/2022.  She has been in physical therapy and has made great progress.  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  Examination of her right shoulder reveals full active range of motion.  She does have slight decreased strength with internal and external rotation.  At this point, she will continue working on strengthening exercises.  We have provided her with a work note to return to work this coming Monday.  She will follow-up with [place] as needed.  Of note, she canceled her left shoulder MRI as she has been feeling much better there.  Follow-Up Instructions: Return if symptoms worsen or fail to improve.   Orders:  No orders of the defined types were placed in this encounter.  No orders of the defined types were placed in this encounter.   Imaging: No new imaging  PMFS History: Patient Active Problem List   Diagnosis Date Noted   Chronic left shoulder pain 04/10/2022   Complete tear of right rotator cuff 02/13/2022   Controlled type 2 diabetes mellitus without complication, without long-term current use of insulin ([place]) 07/26/2021   Hypokalemia 07/26/2021   Lymphocytosis 04/07/2019   Intramural uterine fibroid 02/14/2019   Chronic foot pain, left 03/16/2018   Enlarged thyroid 08/27/2017   Estrogen deficiency 08/27/2017   Obesity (BMI 30-39.9) 06/13/2016   Hyperlipidemia associated with type 2 diabetes mellitus ([place])  07/29/2013   DENTAL CARIES 10/05/2009   INCONTINENCE, URGE 08/02/2009   HYPERTENSION, BENIGN ESSENTIAL 06/06/2008   ALLERGIC RHINITIS 06/06/2008   Past Medical History:  Diagnosis Date   Diabetes mellitus type 2, controlled, without complications ([place])    Diabetes type 2, controlled ([place]) 06/13/2016   Diet controlled    Elevated LDL cholesterol level    Headache    History of low potassium    Hyperlipidemia    Hypertension    PMB (postmenopausal bleeding)    Rotator cuff disorder, right     Family History  Problem Relation Age of Onset   Arthritis Sister    Diabetes Sister    Hypertension Sister    Cancer Father        unknown    Arthritis Brother    Ulcers Brother    Diabetes Sister    Hypertension Sister    Arthritis Sister    Colon cancer Neg Hx     Past Surgical History:  Procedure Laterality Date   DILATATION & CURETTAGE/HYSTEROSCOPY WITH MYOSURE N/A 09/27/2019   Procedure: [place];  Surgeon: Megan Salon, MD;  Location: [place];  Service: Gynecology;  Laterality: N/A;  to follow first case of AM   INDUCED ABORTION     SHOULDER ARTHROSCOPY WITH ROTATOR CUFF REPAIR AND SUBACROMIAL DECOMPRESSION Right 02/13/2022   Procedure: RIGHT SHOULDER ROTATOR CUFF REPAIR, EXTENSIVE DEBRIDEMENT;  Surgeon: Leandrew Koyanagi, MD;  Location: [place];  Service: Orthopedics;  Laterality: Right;   TUBAL LIGATION     Social History   Occupational History   Not on file  Tobacco Use   Smoking status: Never   Smokeless tobacco: Never  Vaping Use   Vaping Use: Never used  Substance and Sexual Activity   Alcohol use: No   Drug use: No   Sexual activity: Not Currently    Birth control/protection: Post-menopausal, Surgical    Comment: BTL

## 2022-06-28 ENCOUNTER — Other Ambulatory Visit: Payer: Self-pay | Admitting: Medical

## 2022-06-28 DIAGNOSIS — E1169 Type 2 diabetes mellitus with other specified complication: Secondary | ICD-10-CM

## 2022-08-16 ENCOUNTER — Other Ambulatory Visit: Payer: Self-pay | Admitting: Medical

## 2022-08-16 DIAGNOSIS — E1169 Type 2 diabetes mellitus with other specified complication: Secondary | ICD-10-CM

## 2022-08-16 DIAGNOSIS — E119 Type 2 diabetes mellitus without complications: Secondary | ICD-10-CM

## 2022-08-16 DIAGNOSIS — I10 Essential (primary) hypertension: Secondary | ICD-10-CM

## 2022-08-19 ENCOUNTER — Other Ambulatory Visit: Payer: Self-pay | Admitting: Medical

## 2022-08-19 DIAGNOSIS — E876 Hypokalemia: Secondary | ICD-10-CM

## 2022-08-19 DIAGNOSIS — I10 Essential (primary) hypertension: Secondary | ICD-10-CM

## 2022-08-20 NOTE — Telephone Encounter (Signed)
Left detailed message for pt to call back and we can refill her meds when she schedules an appointment

## 2022-08-21 ENCOUNTER — Telehealth: Payer: Self-pay | Admitting: Physician Assistant

## 2022-08-21 DIAGNOSIS — E876 Hypokalemia: Secondary | ICD-10-CM

## 2022-08-21 DIAGNOSIS — I10 Essential (primary) hypertension: Secondary | ICD-10-CM

## 2022-08-21 MED ORDER — POTASSIUM CHLORIDE CRYS ER 20 MEQ PO TBCR: 20.0000 meq | EXTENDED_RELEASE_TABLET | Freq: Every day | ORAL | 0 refills | Status: DC

## 2022-08-21 MED ORDER — LISINOPRIL-HYDROCHLOROTHIAZIDE 20-25 MG PO TABS: 1.0000 | ORAL_TABLET | Freq: Every day | ORAL | 0 refills | Status: DC

## 2022-08-21 NOTE — Telephone Encounter (Signed)
sent 

## 2022-08-21 NOTE — Telephone Encounter (Signed)
Pt called and made a medcheck appt for 09/09/2022 if you could please refill her medicine for her

## 2022-09-09 ENCOUNTER — Ambulatory Visit: Payer: BC Managed Care – PPO | Admitting: Nurse Practitioner

## 2022-09-09 ENCOUNTER — Encounter: Payer: Self-pay | Admitting: Nurse Practitioner

## 2022-09-09 VITALS — BP 126/78 | HR 97 | Wt 204.2 lb

## 2022-09-09 DIAGNOSIS — E876 Hypokalemia: Secondary | ICD-10-CM

## 2022-09-09 DIAGNOSIS — E1169 Type 2 diabetes mellitus with other specified complication: Secondary | ICD-10-CM | POA: Diagnosis not present

## 2022-09-09 DIAGNOSIS — I10 Essential (primary) hypertension: Secondary | ICD-10-CM

## 2022-09-09 DIAGNOSIS — E049 Nontoxic goiter, unspecified: Secondary | ICD-10-CM

## 2022-09-09 DIAGNOSIS — E669 Obesity, unspecified: Secondary | ICD-10-CM | POA: Diagnosis not present

## 2022-09-09 DIAGNOSIS — E119 Type 2 diabetes mellitus without complications: Secondary | ICD-10-CM | POA: Diagnosis not present

## 2022-09-09 DIAGNOSIS — E785 Hyperlipidemia, unspecified: Secondary | ICD-10-CM

## 2022-09-09 MED ORDER — RYBELSUS 3 MG PO TABS: 3.0000 mg | ORAL_TABLET | Freq: Every day | ORAL | 0 refills | Status: DC

## 2022-09-09 MED ORDER — LISINOPRIL-HYDROCHLOROTHIAZIDE 20-25 MG PO TABS: 1.0000 | ORAL_TABLET | Freq: Every day | ORAL | 0 refills | Status: DC

## 2022-09-09 MED ORDER — ATORVASTATIN CALCIUM 20 MG PO TABS: 20.0000 mg | ORAL_TABLET | Freq: Every day | ORAL | 0 refills | Status: DC

## 2022-09-09 MED ORDER — POTASSIUM CHLORIDE CRYS ER 20 MEQ PO TBCR: 20.0000 meq | EXTENDED_RELEASE_TABLET | Freq: Every day | ORAL | 0 refills | Status: DC

## 2022-09-09 NOTE — Progress Notes (Signed)
Holly Clamp, DNP, AGNP-c [facility]  [address] [place], [place] [address] [phone]  ESTABLISHED PATIENT- Chronic Health and/or Follow-Up Visit  Blood pressure 126/78, pulse 97, weight 204 lb 3.2 oz (92.6 kg).    Holly Hendrix is a 62 y.o. year old female presenting today for evaluation and management of chronic conditions.   Holly Hendrix presents today with chief complaints of needing a medication review and possible refills. She has a history positive for DM2, HTN, HLD, thyroid enlargement.   The patient reports no recent changes in her health status. She mentions that she has not checked her blood sugars at home and is unsure about her daily carbohydrate intake. She has a blood pressure cuff but does not monitor her blood pressures. Holly Hendrix states that she exercises by walking while at work but denies any structured activity. She is due for her annual eye exam. She tells me that she does not regularly check her feet for sores or wounds.   The patient denies any shortness of breath, chest pain, or dizziness. She denies vision changes.   Holly Hendrix expresses concern about her current Metformin prescription, stating that it sometimes causes stomach pain. She tells me that she would like to eventually come off of the metformin and is hopeful that she will be able to control her blood sugars to the point that she no longer needs medication.  The patient also inquires about the necessity of her Potassium prescription, as she finds the pills to be large and difficult to take.  All ROS negative with exception of what is listed above.   PHYSICAL EXAM Physical Exam Vitals and nursing note reviewed.  Constitutional:      Appearance: Normal appearance. She is obese.  HENT:     Head: Normocephalic.  Eyes:     Pupils: Pupils are equal, round, and reactive to light.  Neck:     Vascular: No carotid bruit.  Cardiovascular:     Rate and Rhythm: Normal rate and regular rhythm.      Pulses: Normal pulses.     Heart sounds: Normal heart sounds.  Pulmonary:     Effort: Pulmonary effort is normal.     Breath sounds: Normal breath sounds.  Abdominal:     General: Bowel sounds are normal.     Palpations: Abdomen is soft.  Musculoskeletal:        General: Normal range of motion.     Cervical back: Normal range of motion. No tenderness.     Right lower leg: No edema.     Left lower leg: No edema.  Lymphadenopathy:     Cervical: No cervical adenopathy.  Skin:    General: Skin is warm and dry.     Capillary Refill: Capillary refill takes less than 2 seconds.  Neurological:     General: No focal deficit present.     Mental Status: She is alert.     Sensory: No sensory deficit.     Motor: No weakness.     Gait: Gait normal.  Psychiatric:        Mood and Affect: Mood normal.     PLAN Problem List Items Addressed This Visit     HYPERTENSION, BENIGN ESSENTIAL    Chronic hypertension managed with lisinopril-HCTZ daily. BP today is controlled. She is not having any alarm symptoms.  Plan: - Labs pending today - Refills provided on medication - Consider monitoring blood pressure at home once a week to ensure that it is staying less  than 130/80 on regular basis. This is for protection for your overall health.       Relevant Medications   atorvastatin (LIPITOR) 20 MG tablet   lisinopril-hydrochlorothiazide (ZESTORETIC) 20-25 MG tablet   potassium chloride SA (KLOR-CON M) 20 MEQ tablet   Other Relevant Orders   CBC with Differential/Platelet (Completed)   Comprehensive metabolic panel (Completed)   Hemoglobin A1c (Completed)   Lipid panel (Completed)   TSH (Completed)   POCT UA - Microalbumin   Hyperlipidemia associated with type 2 diabetes mellitus    Chronic. No alarm symptoms Plan: - Labs pending - Continue with atorvastatin for protection of your blood vessels.  - Monitor your diet and keep your saturated fat intake low - Exercise 20 minutes a day       Relevant Medications   atorvastatin (LIPITOR) 20 MG tablet   lisinopril-hydrochlorothiazide (ZESTORETIC) 20-25 MG tablet   potassium chloride SA (KLOR-CON M) 20 MEQ tablet   Semaglutide (RYBELSUS) 3 MG TABS   Other Relevant Orders   CBC with Differential/Platelet (Completed)   Comprehensive metabolic panel (Completed)   Hemoglobin A1c (Completed)   Lipid panel (Completed)   TSH (Completed)   POCT UA - Microalbumin   Obesity (BMI 30-39.9)    Chronic. BMI 35.05 today. Patient does not appear to get activity outside of regular walking for work. I strongly recommend increased activity with purposeful walking to get heart rate up and kept up at least 20 minutes a day to protect heart health.  Plan: - Work towards walking 20 minutes every day to get heart rate up.       Relevant Medications   atorvastatin (LIPITOR) 20 MG tablet   lisinopril-hydrochlorothiazide (ZESTORETIC) 20-25 MG tablet   potassium chloride SA (KLOR-CON M) 20 MEQ tablet   Semaglutide (RYBELSUS) 3 MG TABS   Other Relevant Orders   CBC with Differential/Platelet (Completed)   Comprehensive metabolic panel (Completed)   Hemoglobin A1c (Completed)   Lipid panel (Completed)   TSH (Completed)   POCT UA - Microalbumin   Enlarged thyroid    No concerns on exam. Labs pending.       Relevant Medications   atorvastatin (LIPITOR) 20 MG tablet   lisinopril-hydrochlorothiazide (ZESTORETIC) 20-25 MG tablet   potassium chloride SA (KLOR-CON M) 20 MEQ tablet   Other Relevant Orders   CBC with Differential/Platelet (Completed)   Comprehensive metabolic panel (Completed)   Hemoglobin A1c (Completed)   Lipid panel (Completed)   TSH (Completed)   POCT UA - Microalbumin   Controlled type 2 diabetes mellitus without complication, without long-term current use of insulin - Primary    Most recent A1c more than a year ago showed good control. We had an extensive conversation today on the progression of diabetes and the  pathology of the condition. We also discussed that very strict diet and exercise may allow her to eliminate medication from her regimen, but given the progression of the disease, this may not be something that would be long term. I strongly recommend monitoring diet and carbohydrate intake to a goal of 150-180 grams divided throughout the day and increasing exercise to at least 20 minutes of purposeful activity daily. I also recommend at least once daily monitoring of blood sugars to ensure that fasting glucose is less than 120. I also recommend monitoring skin and feet on a daily basis to check for wounds. We discussed changing medications today given the side effects of metformin. She is in agreement to trial Rybelsus. A sample has  been provided today and we will see how she does with this and plan to make permanent changes if she is satisfied with the medication.  Plan: - Stop metformin - Start Rybelsus sample. Let me know in 3 weeks if this is working well for you and I will send in the prescription. The dosage you are starting on is a starter dose, with the regular dose being 7mg  a day.  - Monitor blood sugars at least once a day - Please have a dilated eye exam to ensure that your diabetes has not affected your eye site. This should be done once a year.  - Please check your feet daily to make sure you do not have any wounds, sores, ingrown toenails, etc. These can be a significant cause of infection.  - Monitor your diet and increase your exercise.       Relevant Medications   atorvastatin (LIPITOR) 20 MG tablet   lisinopril-hydrochlorothiazide (ZESTORETIC) 20-25 MG tablet   potassium chloride SA (KLOR-CON M) 20 MEQ tablet   Semaglutide (RYBELSUS) 3 MG TABS   Other Relevant Orders   CBC with Differential/Platelet (Completed)   Comprehensive metabolic panel (Completed)   Hemoglobin A1c (Completed)   Lipid panel (Completed)   TSH (Completed)   POCT UA - Microalbumin   Hypokalemia     Patient on potassium replacement. She has concerns with the size of the pill. We will monitor labs today to ensure that this is still needed. We can consider making a change to liquid formulation for easier use.       Relevant Medications   atorvastatin (LIPITOR) 20 MG tablet   lisinopril-hydrochlorothiazide (ZESTORETIC) 20-25 MG tablet   potassium chloride SA (KLOR-CON M) 20 MEQ tablet   Other Relevant Orders   CBC with Differential/Platelet (Completed)   Comprehensive metabolic panel (Completed)   Hemoglobin A1c (Completed)   Lipid panel (Completed)   TSH (Completed)   POCT UA - Microalbumin    Return in about 6 months (around 03/11/2023) for med management.  Time: 49 minutes, >50% spent counseling, care coordination, chart review, and documentation.   Holly Clamp, DNP, AGNP-c 09/09/2022  3:38 PM

## 2022-09-09 NOTE — Patient Instructions (Addendum)
Let me know if you have any concerns or questions.   We will check in to see if the rybelsus is working for you or if you would like to stay on the metformin    Diabetes I recommend that you check your blood sugar at least once every morning before eating and write down this number to bring with you to your follow-up visits so we can go over them.  If your blood sugar is higher than 150 fasting for three readings, please let me know so we can adjust your medications.  It is always a good idea to also check your blood pressure in the mornings, as well, as diabetes and high blood pressure can go hand in hand.  Your goal blood pressure is less than 130/80.  If your blood pressure is higher than your goal for three readings in a row, please let me know so we can adjust your medications.  I recommend daily physical activity of at least 20 minutes to help improve your heart health and help your body use the excess sugar. I recommend no more than 1200-1500 calories per day with no more than 150 grams of carbohydrates

## 2022-09-10 LAB — CBC WITH DIFFERENTIAL/PLATELET
Basophils Absolute: 0.1 10*3/uL (ref 0.0–0.2)
Basos: 1 %
EOS (ABSOLUTE): 0.2 10*3/uL (ref 0.0–0.4)
Eos: 2 %
Hematocrit: 44.6 % (ref 34.0–46.6)
Hemoglobin: 14.1 g/dL (ref 11.1–15.9)
Immature Grans (Abs): 0 10*3/uL (ref 0.0–0.1)
Immature Granulocytes: 0 %
Lymphocytes Absolute: 3.6 10*3/uL — ABNORMAL HIGH (ref 0.7–3.1)
Lymphs: 41 %
MCH: 25.1 pg — ABNORMAL LOW (ref 26.6–33.0)
MCHC: 31.6 g/dL (ref 31.5–35.7)
MCV: 79 fL (ref 79–97)
Monocytes Absolute: 0.5 10*3/uL (ref 0.1–0.9)
Monocytes: 6 %
Neutrophils Absolute: 4.5 10*3/uL (ref 1.4–7.0)
Neutrophils: 50 %
Platelets: 256 10*3/uL (ref 150–450)
RBC: 5.62 x10E6/uL — ABNORMAL HIGH (ref 3.77–5.28)
RDW: 14.4 % (ref 11.7–15.4)
WBC: 8.9 10*3/uL (ref 3.4–10.8)

## 2022-09-10 LAB — POCT UA - MICROALBUMIN
Albumin/Creatinine Ratio, Urine, POC: 16.9
Creatinine, POC: 125.5 mg/dL
Microalbumin Ur, POC: 21.2 mg/L

## 2022-09-10 LAB — COMPREHENSIVE METABOLIC PANEL
ALT: 11 IU/L (ref 0–32)
AST: 13 IU/L (ref 0–40)
Albumin/Globulin Ratio: 1.3 (ref 1.2–2.2)
Albumin: 4 g/dL (ref 3.9–4.9)
Alkaline Phosphatase: 88 IU/L (ref 44–121)
BUN/Creatinine Ratio: 18 (ref 12–28)
BUN: 14 mg/dL (ref 8–27)
Bilirubin Total: 0.7 mg/dL (ref 0.0–1.2)
CO2: 25 mmol/L (ref 20–29)
Calcium: 9.6 mg/dL (ref 8.7–10.3)
Chloride: 101 mmol/L (ref 96–106)
Creatinine, Ser: 0.78 mg/dL (ref 0.57–1.00)
Globulin, Total: 3.1 g/dL (ref 1.5–4.5)
Glucose: 91 mg/dL (ref 70–99)
Potassium: 3.6 mmol/L (ref 3.5–5.2)
Sodium: 141 mmol/L (ref 134–144)
Total Protein: 7.1 g/dL (ref 6.0–8.5)
eGFR: 85 mL/min/{1.73_m2} (ref >59)

## 2022-09-10 LAB — LIPID PANEL
Chol/HDL Ratio: 2.5 ratio (ref 0.0–4.4)
Cholesterol, Total: 117 mg/dL (ref 100–199)
HDL: 47 mg/dL (ref >39)
LDL Chol Calc (NIH): 57 mg/dL (ref 0–99)
Triglycerides: 62 mg/dL (ref 0–149)
VLDL Cholesterol Cal: 13 mg/dL (ref 5–40)

## 2022-09-10 LAB — HEMOGLOBIN A1C
Est. average glucose Bld gHb Est-mCnc: 146 mg/dL
Hgb A1c MFr Bld: 6.7 % — ABNORMAL HIGH (ref 4.8–5.6)

## 2022-09-10 LAB — TSH: TSH: 0.901 u[IU]/mL (ref 0.450–4.500)

## 2022-09-10 NOTE — Assessment & Plan Note (Signed)
Patient on potassium replacement. She has concerns with the size of the pill. We will monitor labs today to ensure that this is still needed. We can consider making a change to liquid formulation for easier use.

## 2022-09-10 NOTE — Assessment & Plan Note (Signed)
Chronic. BMI 35.05 today. Patient does not appear to get activity outside of regular walking for work. I strongly recommend increased activity with purposeful walking to get heart rate up and kept up at least 20 minutes a day to protect heart health.  Plan: - Work towards walking 20 minutes every day to get heart rate up.

## 2022-09-10 NOTE — Addendum Note (Signed)
Addended by: Luciana Axe, Kessler Solly D on: 09/10/2022 12:36 PM   Modules accepted: Orders

## 2022-09-10 NOTE — Assessment & Plan Note (Signed)
Most recent A1c more than a year ago showed good control. We had an extensive conversation today on the progression of diabetes and the pathology of the condition. We also discussed that very strict diet and exercise may allow her to eliminate medication from her regimen, but given the progression of the disease, this may not be something that would be long term. I strongly recommend monitoring diet and carbohydrate intake to a goal of 150-180 grams divided throughout the day and increasing exercise to at least 20 minutes of purposeful activity daily. I also recommend at least once daily monitoring of blood sugars to ensure that fasting glucose is less than 120. I also recommend monitoring skin and feet on a daily basis to check for wounds. We discussed changing medications today given the side effects of metformin. She is in agreement to trial Rybelsus. A sample has been provided today and we will see how she does with this and plan to make permanent changes if she is satisfied with the medication.  Plan: - Stop metformin - Start Rybelsus sample. Let me know in 3 weeks if this is working well for you and I will send in the prescription. The dosage you are starting on is a starter dose, with the regular dose being  a day.  - Monitor blood sugars at least once a day - Please have a dilated eye exam to ensure that your diabetes has not affected your eye site. This should be done once a year.  - Please check your feet daily to make sure you do not have any wounds, sores, ingrown toenails, etc. These can be a significant cause of infection.  - Monitor your diet and increase your exercise.

## 2022-09-10 NOTE — Assessment & Plan Note (Signed)
Chronic hypertension managed with lisinopril-HCTZ daily. BP today is controlled. She is not having any alarm symptoms.  Plan: - Labs pending today - Refills provided on medication - Consider monitoring blood pressure at home once a week to ensure that it is staying less than 130/80 on regular basis. This is for protection for your overall health.

## 2022-09-10 NOTE — Assessment & Plan Note (Signed)
No concerns on exam. Labs pending.

## 2022-09-10 NOTE — Assessment & Plan Note (Signed)
Chronic. No alarm symptoms Plan: - Labs pending - Continue with atorvastatin for protection of your blood vessels.  - Monitor your diet and keep your saturated fat intake low - Exercise 20 minutes a day

## 2022-09-18 ENCOUNTER — Other Ambulatory Visit: Payer: Self-pay | Admitting: Nurse Practitioner

## 2022-10-22 ENCOUNTER — Other Ambulatory Visit: Payer: Self-pay | Admitting: Nurse Practitioner

## 2022-10-22 DIAGNOSIS — E1169 Type 2 diabetes mellitus with other specified complication: Secondary | ICD-10-CM

## 2022-10-22 DIAGNOSIS — E876 Hypokalemia: Secondary | ICD-10-CM

## 2022-10-22 DIAGNOSIS — E049 Nontoxic goiter, unspecified: Secondary | ICD-10-CM

## 2022-10-22 DIAGNOSIS — I10 Essential (primary) hypertension: Secondary | ICD-10-CM

## 2022-10-22 DIAGNOSIS — E669 Obesity, unspecified: Secondary | ICD-10-CM

## 2022-10-22 DIAGNOSIS — E119 Type 2 diabetes mellitus without complications: Secondary | ICD-10-CM

## 2022-11-12 ENCOUNTER — Encounter: Payer: Self-pay | Admitting: Medical

## 2022-11-12 ENCOUNTER — Ambulatory Visit: Payer: BC Managed Care – PPO | Admitting: Medical

## 2022-11-12 VITALS — BP 132/84 | HR 94 | Ht 64.0 in | Wt 208.0 lb

## 2022-11-12 DIAGNOSIS — M659 Synovitis and tenosynovitis, unspecified: Secondary | ICD-10-CM | POA: Diagnosis not present

## 2022-11-12 DIAGNOSIS — M25669 Stiffness of unspecified knee, not elsewhere classified: Secondary | ICD-10-CM

## 2022-11-12 DIAGNOSIS — M79604 Pain in right leg: Secondary | ICD-10-CM

## 2022-11-12 DIAGNOSIS — M79605 Pain in left leg: Secondary | ICD-10-CM

## 2022-11-12 DIAGNOSIS — E1169 Type 2 diabetes mellitus with other specified complication: Secondary | ICD-10-CM

## 2022-11-12 DIAGNOSIS — E119 Type 2 diabetes mellitus without complications: Secondary | ICD-10-CM

## 2022-11-12 DIAGNOSIS — E785 Hyperlipidemia, unspecified: Secondary | ICD-10-CM

## 2022-11-12 MED ORDER — BLOOD GLUCOSE MONITORING SUPPL DEVI: 1.0000 | Freq: Three times a day (TID) | 0 refills | Status: AC

## 2022-11-12 MED ORDER — LANCET DEVICE MISC: 1.0000 | Freq: Two times a day (BID) | 0 refills | Status: AC

## 2022-11-12 MED ORDER — LANCETS MISC. MISC: 1.0000 | Freq: Two times a day (BID) | 5 refills | Status: AC

## 2022-11-12 MED ORDER — BLOOD GLUCOSE TEST VI STRP: 1.0000 | ORAL_STRIP | Freq: Two times a day (BID) | 5 refills | Status: AC

## 2022-11-12 NOTE — Addendum Note (Signed)
Addended by: Debbrah Alar F on: 11/12/2022 04:12 PM   Modules accepted: Orders

## 2022-11-12 NOTE — Progress Notes (Signed)
Subjective:  Holly Hendrix is a 62 y.o. female who presents for Chief Complaint  Patient presents with   Wrist Pain    Left wrist pain x 2 weeks. When she lifts heavy things her wrist hurts, she is right handed. Leg stiffness for a while. Saw Vickie for this.       Here with her son.  here for left wrist pain.  No fall, no injury.   Pain for about 2 weeks. ROM is ok, but certain motion hurts.  No numbness or tingling.  Works in [employer], but out for the summer.  Tried a splint OTC.   Using some tylenol some.  Right handed.  She notes legs feel stiffness in general on and off, sometimes tight feeling.  No injury, no fall, no numbness, tingling or weakness.  No reported back pain.    No other aggravating or relieving factors.    No other c/o.  Past Medical History:  Diagnosis Date   Diabetes mellitus type 2, controlled, without complications (HCC)    Diabetes type 2, controlled (HCC) 06/13/2016   Diet controlled    Elevated LDL cholesterol level    Headache    History of low potassium    Hyperlipidemia    Hypertension    PMB (postmenopausal bleeding)    Rotator cuff disorder, right    Current Outpatient Medications on File Prior to Visit  Medication Sig Dispense Refill   atorvastatin (LIPITOR) 20 MG tablet Take 1 tablet (20 mg total) by mouth daily. 90 tablet 0   lisinopril-hydrochlorothiazide (ZESTORETIC) 20-25 MG tablet Take 1 tablet by mouth daily. 30 tablet 0   metFORMIN (GLUCOPHAGE) 500 MG tablet Take 500 mg by mouth daily with breakfast.     potassium chloride SA (KLOR-CON M20) 20 MEQ tablet Take 1 tablet by mouth once daily 30 tablet 5   Acetaminophen (TYLENOL PO) Take 500 mg by mouth. Takes 2 tabs (Patient not taking: Reported on 11/12/2022)     Liniments (ICE MENTHOL EX) Apply topically. (Patient not taking: Reported on 11/12/2022)     Semaglutide (RYBELSUS) 3 MG TABS Take 1 tablet (3 mg total) by mouth daily. (Patient not taking: Reported on 11/12/2022) 30  tablet 0   No current facility-administered medications on file prior to visit.     The following portions of the patient's history were reviewed and updated as appropriate: allergies, current medications, past family history, past medical history, past social history, past surgical history and problem list.  ROS Otherwise as in subjective above    Objective: BP 132/84   Pulse 94   Ht 5\' 4"  (1.626 m)   Wt 208 lb (94.3 kg)   LMP  (LMP Unknown)   SpO2 97%   BMI 35.70 kg/m   Wt Readings from Last 3 Encounters:  11/12/22 208 lb (94.3 kg)  09/09/22 204 lb 3.2 oz (92.6 kg)  02/13/22 204 lb 12.9 oz (92.9 kg)    General appearance: alert, no distress, well developed, well nourished Tender over left lateral wrist, mild pain with range of motion, otherwise hand wrist and arm nontender with normal range of motion no deformity Mild to moderate varicose veins noted in both lower extremities.  Otherwise legs nontender with normal range of motion and relatively good flexibility Arms and legs neurovascularly intact    Assessment: Encounter Diagnoses  Name Primary?   Tenosynovitis of wrist Yes   Stiffness of joint of lower leg    Pain in both lower extremities  Controlled type 2 diabetes mellitus without complication, without long-term current use of insulin (HCC)    Hyperlipidemia associated with type 2 diabetes mellitus (HCC)      Plan: Tenosynovitis of left wrist I recommend relative rest over the next 2 weeks Consider using an arm sling or a thumb spica splint to splint and support the hand and wrist for the next 7 to 10 days You can use over-the-counter Aleve once daily for the next 7 to 10 days You can use Tylenol as well for pain as needed up to twice daily Consider cold therapy with ice water or bag of frozen peas 20 minutes 3 times a day for the next few days for the wrist Over the next week or 2 the symptoms should gradually improve If not improved within 2 weeks  then call or recheck     Leg pain, leg stiffness Your leg exam is fairly unremarkable today other than varicose veins I recommend stretching daily Drink plenty water throughout the day such as 80 to 100 ounces of water daily Consider wearing compression hose daily in regards to varicose veins Walk for exercise on a regular basis If you do not seem to have improvement over the next week or 2 consider stopping your cholesterol pill for 1 week to see if that has any impact on the leg symptoms.  Sometimes statin drugs such as Crestor or Lipitor can cause aches in the muscles often in the legs Recheck if not improving.  If you do end up stopping your cholesterol medicine make sure you follow-up a week after stopping the medication   Alyssia was seen today for wrist pain.  Diagnoses and all orders for this visit:  Tenosynovitis of wrist  Stiffness of joint of lower leg  Pain in both lower extremities  Controlled type 2 diabetes mellitus without complication, without long-term current use of insulin (HCC)  Hyperlipidemia associated with type 2 diabetes mellitus (HCC)    Follow up: 2 weeks

## 2022-11-12 NOTE — Patient Instructions (Addendum)
Encounter Diagnoses  Name Primary?   Tenosynovitis of wrist Yes   Stiffness of joint of lower leg    Pain in both lower extremities    Controlled type 2 diabetes mellitus without complication, without long-term current use of insulin (HCC)    Hyperlipidemia associated with type 2 diabetes mellitus (HCC)     Tenosynovitis of left wrist I recommend relative rest over the next 2 weeks Consider using an arm sling or a thumb spica splint to splint and support the hand and wrist for the next 7 to 10 days You can use over-the-counter Aleve once daily for the next 7 to 10 days You can use Tylenol as well for pain as needed up to twice daily Consider cold therapy with ice water or bag of frozen peas 20 minutes 3 times a day for the next few days for the wrist Over the next week or 2 the symptoms should gradually improve If not improved within 2 weeks then call or recheck     Leg pain, leg stiffness Your leg exam is fairly unremarkable today other than varicose veins I recommend stretching daily Drink plenty water throughout the day such as 80 to 100 ounces of water daily Consider wearing compression hose daily in regards to varicose veins Walk for exercise on a regular basis If you do not seem to have improvement over the next week or 2 consider stopping your cholesterol pill for 1 week to see if that has any impact on the leg symptoms.  Sometimes statin drugs such as Crestor or Lipitor can cause aches in the muscles often in the legs Recheck if not improving.  If you do end up stopping your cholesterol medicine make sure you follow-up a week after stopping the medication

## 2022-11-15 ENCOUNTER — Other Ambulatory Visit: Payer: Self-pay | Admitting: Medical

## 2022-12-13 ENCOUNTER — Encounter: Payer: Self-pay | Admitting: Nurse Practitioner

## 2022-12-13 ENCOUNTER — Ambulatory Visit: Payer: BC Managed Care – PPO | Admitting: Nurse Practitioner

## 2022-12-13 VITALS — BP 124/82 | HR 82 | Wt 207.6 lb

## 2022-12-13 DIAGNOSIS — E119 Type 2 diabetes mellitus without complications: Secondary | ICD-10-CM | POA: Diagnosis not present

## 2022-12-13 DIAGNOSIS — M778 Other enthesopathies, not elsewhere classified: Secondary | ICD-10-CM | POA: Diagnosis not present

## 2022-12-13 MED ORDER — MELOXICAM 15 MG PO TABS: 15.0000 mg | ORAL_TABLET | Freq: Every day | ORAL | 3 refills | Status: DC | PRN

## 2022-12-13 NOTE — Progress Notes (Signed)
  Tollie Eth, DNP, AGNP-c [facility] [address] [place], [place] [address] [phone]   ACUTE VISIT- ESTABLISHED PATIENT  Blood pressure 124/82, pulse 82, weight 207 lb 9.6 oz (94.2 kg).  Subjective:  HPI Holly Hendrix is a 62 y.o. female presents to day for evaluation of:  Left wrist pain and swelling Kashauna reports ongoing swelling and pain with movement of the left wrist that has been present for about a month. She was previously seen by my partner for this issue and diagnosed with tenosynivitis of the wrist. She reports that she has not been bracing the wrist consistently and has been using the wrist and arm, but denies any repeated movements.   PMH, Medications, and Allergies reviewed and updated in chart as appropriate.   ROS negative except for what is listed in HPI. Objective:  Physical Exam Musculoskeletal:        General: Swelling and tenderness present.     Comments: Swelling and tenderness noted to the left wrist and forearm from the thumb proximally to the mid forearm. No ecchymosis noted. There is some weakness with grip and internal rotation of the hand.   Skin:    General: Skin is warm and dry.         Assessment & Plan:   Problem List Items Addressed This Visit     Left wrist tendonitis - Primary    Suspect ongoing tendonitis of the left wrist causing pain, swelling, and limitations to ROM. Grip is weakened. Unclear of etiology.  Plan: - Recommend splinting of the wrist consistently, with exception for washing, for at least 1 week.  - Begin exercises in 1 week  - Use ice on the area at least twice a day - Avoid repetitive movements or excessive use of the left arm  - Prescription for meloxicam provided for daily use for at least 1 week to help with pain and inflammation - If no improvement in the next 3 weeks, recommend ortho evaluation and formal PT      Relevant Medications   meloxicam (MOBIC) 15 MG tablet   Controlled type  2 diabetes mellitus without complication, without long-term current use of insulin (HCC)      Time: 21  minutes, >50% spent counseling, care coordination, chart review, and documentation.    Tollie Eth, DNP, AGNP-c   History, Medications, Surgery, SDOH, and Family History reviewed and updated as appropriate.

## 2022-12-13 NOTE — Patient Instructions (Addendum)
Wrist Tendonitis   A brace something like this can help to keep your wrist immobile while it rests and the swelling goes down and the tendon is allowed to heal. This can take a few weeks to fully resolve, but after a week of wearing the wrap or a brace you can take it off and start the exercises.   Hold your (atorvastatin) lipitor for 2 weeks, then you can restart this. Sometimes these medications can cause the pain to remain in a joint.    If you see no improvement in a week, I want you to call and let Madelaine Bhat know so we can come up with a new plan.

## 2022-12-16 DIAGNOSIS — M778 Other enthesopathies, not elsewhere classified: Secondary | ICD-10-CM

## 2022-12-16 HISTORY — DX: Other enthesopathies, not elsewhere classified: M77.8

## 2022-12-16 NOTE — Assessment & Plan Note (Signed)
Suspect ongoing tendonitis of the left wrist causing pain, swelling, and limitations to ROM. Grip is weakened. Unclear of etiology.  Plan: - Recommend splinting of the wrist consistently, with exception for washing, for at least 1 week.  - Begin exercises in 1 week  - Use ice on the area at least twice a day - Avoid repetitive movements or excessive use of the left arm  - Prescription for meloxicam provided for daily use for at least 1 week to help with pain and inflammation - If no improvement in the next 3 weeks, recommend ortho evaluation and formal PT

## 2022-12-19 ENCOUNTER — Other Ambulatory Visit: Payer: Self-pay | Admitting: Nurse Practitioner

## 2022-12-19 DIAGNOSIS — Z1231 Encounter for screening mammogram for malignant neoplasm of breast: Secondary | ICD-10-CM

## 2022-12-31 ENCOUNTER — Other Ambulatory Visit: Payer: Self-pay | Admitting: Nurse Practitioner

## 2022-12-31 DIAGNOSIS — I10 Essential (primary) hypertension: Secondary | ICD-10-CM

## 2022-12-31 DIAGNOSIS — E876 Hypokalemia: Secondary | ICD-10-CM

## 2022-12-31 DIAGNOSIS — E669 Obesity, unspecified: Secondary | ICD-10-CM

## 2022-12-31 DIAGNOSIS — E119 Type 2 diabetes mellitus without complications: Secondary | ICD-10-CM

## 2022-12-31 DIAGNOSIS — E049 Nontoxic goiter, unspecified: Secondary | ICD-10-CM

## 2022-12-31 DIAGNOSIS — E1169 Type 2 diabetes mellitus with other specified complication: Secondary | ICD-10-CM

## 2023-01-05 ENCOUNTER — Other Ambulatory Visit: Payer: Self-pay | Admitting: Nurse Practitioner

## 2023-01-05 DIAGNOSIS — E669 Obesity, unspecified: Secondary | ICD-10-CM

## 2023-01-05 DIAGNOSIS — E119 Type 2 diabetes mellitus without complications: Secondary | ICD-10-CM

## 2023-01-05 DIAGNOSIS — I10 Essential (primary) hypertension: Secondary | ICD-10-CM

## 2023-01-05 DIAGNOSIS — E049 Nontoxic goiter, unspecified: Secondary | ICD-10-CM

## 2023-01-05 DIAGNOSIS — E1169 Type 2 diabetes mellitus with other specified complication: Secondary | ICD-10-CM

## 2023-01-05 DIAGNOSIS — E876 Hypokalemia: Secondary | ICD-10-CM

## 2023-01-21 ENCOUNTER — Ambulatory Visit
Admission: RE | Admit: 2023-01-21 | Discharge: 2023-01-21 | Disposition: A | Discharge status: Home / Self Care | Payer: BC Managed Care – PPO | Source: Ambulatory Visit | Attending: Nurse Practitioner | Admitting: Nurse Practitioner

## 2023-01-21 DIAGNOSIS — Z1231 Encounter for screening mammogram for malignant neoplasm of breast: Secondary | ICD-10-CM

## 2023-02-09 ENCOUNTER — Other Ambulatory Visit: Payer: Self-pay | Admitting: Nurse Practitioner

## 2023-03-04 ENCOUNTER — Encounter: Payer: Self-pay | Admitting: Nurse Practitioner

## 2023-03-04 ENCOUNTER — Ambulatory Visit: Payer: BC Managed Care – PPO | Admitting: Nurse Practitioner

## 2023-03-04 VITALS — BP 128/82 | HR 95 | Wt 205.0 lb

## 2023-03-04 DIAGNOSIS — E1159 Type 2 diabetes mellitus with other circulatory complications: Secondary | ICD-10-CM | POA: Diagnosis not present

## 2023-03-04 DIAGNOSIS — E1169 Type 2 diabetes mellitus with other specified complication: Secondary | ICD-10-CM | POA: Diagnosis not present

## 2023-03-04 DIAGNOSIS — E876 Hypokalemia: Secondary | ICD-10-CM

## 2023-03-04 DIAGNOSIS — E118 Type 2 diabetes mellitus with unspecified complications: Secondary | ICD-10-CM

## 2023-03-04 DIAGNOSIS — E669 Obesity, unspecified: Secondary | ICD-10-CM

## 2023-03-04 DIAGNOSIS — E785 Hyperlipidemia, unspecified: Secondary | ICD-10-CM

## 2023-03-04 DIAGNOSIS — E119 Type 2 diabetes mellitus without complications: Secondary | ICD-10-CM | POA: Diagnosis not present

## 2023-03-04 DIAGNOSIS — E049 Nontoxic goiter, unspecified: Secondary | ICD-10-CM

## 2023-03-04 DIAGNOSIS — D72820 Lymphocytosis (symptomatic): Secondary | ICD-10-CM

## 2023-03-04 DIAGNOSIS — Z23 Encounter for immunization: Secondary | ICD-10-CM

## 2023-03-04 DIAGNOSIS — I10 Essential (primary) hypertension: Secondary | ICD-10-CM

## 2023-03-04 DIAGNOSIS — I152 Hypertension secondary to endocrine disorders: Secondary | ICD-10-CM

## 2023-03-04 DIAGNOSIS — B3731 Acute candidiasis of vulva and vagina: Secondary | ICD-10-CM

## 2023-03-04 MED ORDER — ATORVASTATIN CALCIUM 20 MG PO TABS
20.0000 mg | ORAL_TABLET | Freq: Every day | ORAL | 1 refills | Status: DC
Start: 2023-03-04 — End: 2023-09-02

## 2023-03-04 MED ORDER — FLUCONAZOLE 150 MG PO TABS: 150.0000 mg | ORAL_TABLET | Freq: Once | ORAL | 5 refills | Status: AC

## 2023-03-04 MED ORDER — RYBELSUS 7 MG PO TABS: 7.0000 mg | ORAL_TABLET | Freq: Every day | ORAL | 0 refills | Status: DC

## 2023-03-04 MED ORDER — LISINOPRIL-HYDROCHLOROTHIAZIDE 20-25 MG PO TABS
1.0000 | ORAL_TABLET | Freq: Every day | ORAL | 1 refills | Status: DC
Start: 2023-03-04 — End: 2023-09-02

## 2023-03-04 NOTE — Assessment & Plan Note (Signed)
Chronic. Lipids monitored today. No alarm symptoms present at this time. LDL Goal < 70. Controlled with  atorvastatin .  Plan: Continue current lipid-lowering therapy. orders written for new lab studies as appropriate; see orders Recommend Treatment of diabetes , Treatment of hypertension , Weight reduction , and Strict diet and exercise Diet and exercise recommendations provided.  Follow-up in 6months or sooner based on lab findings as appropriate.

## 2023-03-04 NOTE — Patient Instructions (Addendum)
Drink plenty of water to help flush the kidneys of the extra sugar.   Keep an eye on your blood sugars. Your goal is to have your morning blood sugar around 100.  Keep an eye on your blood pressure. Your goal is to have your blood pressure less than 130/80.  Finish your metformin then, if it is affordable, start on the Rybelsus.    Potassium Content of Foods  Potassium is a mineral found in many foods and drinks. It can affect how the heart works, affect blood pressure, and keep fluids and electrolytes balanced in the body. It is important not to have too much potassium (hyperkalemia) or too little potassium (hypokalemia) in the body, especially in the blood. Potassium is naturally found in many different types of whole foods, such as fruits, vegetables, meat, and dairy products. Processed foods tend to be lower in potassium. The amount of potassium you need each day depends on your age and any medical conditions you may have. General recommendations are: Females aged 58 and older: 2,600 mg per day. Males aged 57 and older: 3,400 mg per day. Talk with your health care provider or dietitian about how much potassium you need. What foods are high in potassium? Below are examples of foods that have greater than 200 mg of potassium per serving. Fruits Orange -- 1 medium (130 g) has 230 mg of potassium. Banana -- 1 medium (120 g) has 420 mg of potassium. Cantaloupe, chunks -- 1 cup (160 g) has 430 mg of potassium. Vegetables Potato, baked, without skin -- 1 medium (170 g) has 600 mg of potassium. Broccoli, chopped, cooked --  cup (77.5 g) has 230 mg of potassium. Tomato, chopped or sliced -- 1 cup (152 g) has 400 mg of potassium. Grains Cereal, bran with raisins -- 1 cup (59 g) has 360 mg of potassium. Granola with almonds --  cup (82 g) has 220 mg of potassium. Meats and other proteins Ground beef patty -- 4 ounces (113 g) has 240 mg of potassium. Kidney beans, boiled --  cup (130 g) has  350 mg of potassium. Almonds -- 1 ounce (approximately 22 nuts or 28 g) has 200 mg of potassium. Dairy Cow's milk, 1% -- 1 cup (237 mL) has 360 mg of potassium. Plain vanilla low-fat yogurt --  cup (184 g) has 220 mg of potassium. The items listed above may not be a complete list of foods high in potassium. Actual amounts of potassium may be different depending on ripeness, shelf life, and food preparation. Contact a dietitian for more information. What foods are low in potassium? Below are examples of foods that have less than 200 mg of potassium per serving. Fruits Blueberries -- 1 cup (145 g) has 110 mg of potassium. Apple -- 1 medium (140 g) has 145 mg of potassium. Grapes -- 1 cup (160 g) has 175 mg of potassium. Vegetables Cabbage, raw -- 1 cup (70 g) has 120 mg of potassium. Cauliflower, chopped, cooked -- 1 cup (180 g) has 90 mg of potassium. Romaine lettuce, chopped -- 1 cup (56 g) has 120 mg of potassium. Grains Bagel, plain -- one 4-inch (10 cm) has 100 mg of potassium. Whole wheat bread -- 1 slice (26 g) has 70 mg of potassium. White rice, cooked -- 1 cup (163 g) has 50 mg of potassium. Meats and other proteins Tuna, light, canned in water -- 3 ounces (85 g) has 150 mg of potassium. Egg, fried -- 1 large (50 g)  has 60 mg of potassium. Peanuts --1 ounce (35 nuts or 28 g) has 180 mg of potassium. Tofu --  cup (252 g) has 150 mg of potassium. Dairy Cheese (cheddar, colby, mozzarella, or provolone) -- 1 ounce (28 g) has 30 to 40 mg of potassium. The items listed above may not be a complete list of foods that are low in potassium. Actual amounts of potassium may be different depending on ripeness, shelf life, and food preparation. Contact a dietitian for more information. Summary Potassium is a mineral found in many foods and drinks. It affects how the heart works, affects blood pressure, and keeps fluids and electrolytes balanced in the body. The amount of potassium you need  each day depends on your age and any existing medical conditions you may have. Your health care provider or dietitian may recommend an amount of potassium that you should have each day. This information is not intended to replace advice given to you by your health care provider. Make sure you discuss any questions you have with your health care provider. Document Revised: 02/06/2021 Document Reviewed: 01/18/2021 Elsevier Patient Education  2024 [employer].

## 2023-03-04 NOTE — Assessment & Plan Note (Signed)
Chronic. BP stable. No alarm symptoms present at this time.  Medication: taking as prescribed. Goal blood pressure less than less than 130/80. Currently is not followed with cardiology.  Plan: current treatment plan is effective, no change in therapy, orders and follow up as documented in Epic[facility].  Refills have been provided today. Labs today to check electrolytes and kidney function.  Will make changes to plan of care as necessary based on lab results.  Plan to follow-up in 6months.

## 2023-03-04 NOTE — Assessment & Plan Note (Signed)
Monitor thyroid labs today. No alarm symptoms.

## 2023-03-04 NOTE — Assessment & Plan Note (Signed)
Patient reports better control with Rybelsus compared to Metformin, which she believes is causing yeast infections. Currently on Metformin due to cost considerations. -Attempt to obtain coverage for Rybelsus and provide samples if available. -Check blood glucose levels at home. -Continue Metformin until it is finished, then switch to Rybelsus if approved and affordable. -Sent in Diflucan for yeast infection, with refills as needed.

## 2023-03-04 NOTE — Progress Notes (Signed)
Shawna Clamp, DNP, AGNP-c [facility]  [address] [place], [place] [address] [phone]  ESTABLISHED PATIENT- Chronic Health and/or Follow-Up Visit  Blood pressure 128/82, pulse 95, weight 205 lb (93 kg).    Holly Hendrix is a 62 y.o. year old female presenting today for evaluation and management of chronic conditions.  Holly Hendrix presents for follow-up for her diabetes. She reports that she has stopped taking Rybelsus due to running out of the medication and has resumed taking Metformin. However, she believes that Metformin is causing her to experience yeast infections, characterized by a burning sensation. She expresses a preference for Rybelsus, which she felt was more effective, but is concerned about the cost.  The patient also reports intermittent wrist pain, which is exacerbated by certain movements and activities. She has been managing the pain with a brace, but does not wear it consistently.  Regarding her diabetes management, the patient has a home blood glucose monitoring system and reports that her blood glucose levels have been ranging from 90 to 128. She has not experienced any extremely high readings in the 200s.  The patient also mentions a recent eye exam, which showed only minor changes in vision. She denies any changes in vision or headaches. She does not regularly check her blood pressure at home, but notes that readings can vary.  Lastly, the patient reports occasional episodes of what she believes to be yeast infections, characterized by burning. She is not currently taking anything for these episodes. She also mentions that she is not drinking as much water as she should be.  Sees [facility] on [address]. Recently had her exam done. She did have some vision changes and required new glasses.   All ROS negative with exception of what is listed above.   PHYSICAL EXAM Physical Exam Vitals and nursing note reviewed.  Constitutional:       Appearance: Normal appearance.  HENT:     Head: Normocephalic.  Eyes:     Conjunctiva/sclera: Conjunctivae normal.     Pupils: Pupils are equal, round, and reactive to light.  Neck:     Vascular: No carotid bruit.  Cardiovascular:     Rate and Rhythm: Normal rate and regular rhythm.     Pulses: Normal pulses.     Heart sounds: Normal heart sounds.  Pulmonary:     Effort: Pulmonary effort is normal.     Breath sounds: Normal breath sounds.  Abdominal:     General: Bowel sounds are normal.     Palpations: Abdomen is soft.  Musculoskeletal:        General: Normal range of motion.     Cervical back: Normal range of motion.     Right lower leg: No edema.     Left lower leg: No edema.  Skin:    General: Skin is warm and dry.  Neurological:     General: No focal deficit present.     Mental Status: She is alert and oriented to person, place, and time.  Psychiatric:        Mood and Affect: Mood normal.      PLAN Problem List Items Addressed This Visit     Hypertension associated with diabetes (HCC)    Chronic. BP stable. No alarm symptoms present at this time.  Medication: taking as prescribed. Goal blood pressure less than less than 130/80. Currently is not followed with cardiology.  Plan: current treatment plan is effective, no change in therapy, orders and follow up as documented in  [facility]pic[facility].  Refills have been provided today. Labs today to check electrolytes and kidney function.  Will make changes to plan of care as necessary based on lab results.  Plan to follow-up in 6months.       Relevant Medications   Semaglutide (RYBELSUS) 7 MG TABS   lisinopril-hydrochlorothiazide (ZESTORETIC) 20-25 MG tablet   atorvastatin (LIPITOR) 20 MG tablet   Hyperlipidemia associated with type 2 diabetes mellitus (HCC)    Chronic. Lipids monitored today. No alarm symptoms present at this time. LDL Goal < 70. Controlled with  atorvastatin .  Plan: Continue current lipid-lowering  therapy. orders written for new lab studies as appropriate; see orders Recommend Treatment of diabetes , Treatment of hypertension , Weight reduction , and Strict diet and exercise Diet and exercise recommendations provided.  Follow-up in 6months or sooner based on lab findings as appropriate.         Relevant Medications   Semaglutide (RYBELSUS) 7 MG TABS   lisinopril-hydrochlorothiazide (ZESTORETIC) 20-25 MG tablet   atorvastatin (LIPITOR) 20 MG tablet   Enlarged thyroid    Monitor thyroid labs today. No alarm symptoms.       Relevant Medications   lisinopril-hydrochlorothiazide (ZESTORETIC) 20-25 MG tablet   atorvastatin (LIPITOR) 20 MG tablet   Controlled diabetes mellitus type 2 with complications (HCC) - Primary    Patient reports better control with Rybelsus compared to Metformin, which she believes is causing yeast infections. Currently on Metformin due to cost considerations. -Attempt to obtain coverage for Rybelsus and provide samples if available. -Check blood glucose levels at home. -Continue Metformin until it is finished, then switch to Rybelsus if approved and affordable. -Sent in Diflucan for yeast infection, with refills as needed.      Relevant Medications   Semaglutide (RYBELSUS) 7 MG TABS   lisinopril-hydrochlorothiazide (ZESTORETIC) 20-25 MG tablet   atorvastatin (LIPITOR) 20 MG tablet   Hypokalemia    Monitor today. Discussion of high potassium foods that can be utilized in place of potassium supplement. Will monitor closely .      Relevant Medications   lisinopril-hydrochlorothiazide (ZESTORETIC) 20-25 MG tablet   atorvastatin (LIPITOR) 20 MG tablet   Obesity (BMI 30-39.9)   Relevant Medications   Semaglutide (RYBELSUS) 7 MG TABS   lisinopril-hydrochlorothiazide (ZESTORETIC) 20-25 MG tablet   atorvastatin (LIPITOR) 20 MG tablet   Lymphocytosis   Other Visit Diagnoses     Need for influenza vaccination       Relevant Orders   Flu vaccine  trivalent PF, 6mos and older(Flulaval,Afluria,Fluarix,Fluzone) (Completed)   Vaginal yeast infection       Relevant Medications   fluconazole (DIFLUCAN) 150 MG tablet   HYPERTENSION, BENIGN ESSENTIAL       Relevant Medications   lisinopril-hydrochlorothiazide (ZESTORETIC) 20-25 MG tablet   atorvastatin (LIPITOR) 20 MG tablet       Return in about 6 months (around 09/02/2023) for Med Management 30.  Shawna Clamp, DNP, AGNP-c

## 2023-03-04 NOTE — Assessment & Plan Note (Signed)
Monitor today. Discussion of high potassium foods that can be utilized in place of potassium supplement. Will monitor closely .

## 2023-03-05 LAB — CBC WITH DIFFERENTIAL/PLATELET
Basophils Absolute: 0.1 10*3/uL (ref 0.0–0.2)
Basos: 1 %
EOS (ABSOLUTE): 0.3 10*3/uL (ref 0.0–0.4)
Eos: 4 %
Hematocrit: 48.4 % — ABNORMAL HIGH (ref 34.0–46.6)
Hemoglobin: 15.2 g/dL (ref 11.1–15.9)
Immature Grans (Abs): 0 10*3/uL (ref 0.0–0.1)
Immature Granulocytes: 0 %
Lymphocytes Absolute: 4.1 10*3/uL — ABNORMAL HIGH (ref 0.7–3.1)
Lymphs: 50 %
MCH: 25.5 pg — ABNORMAL LOW (ref 26.6–33.0)
MCHC: 31.4 g/dL — ABNORMAL LOW (ref 31.5–35.7)
MCV: 81 fL (ref 79–97)
Monocytes Absolute: 0.5 10*3/uL (ref 0.1–0.9)
Monocytes: 6 %
Neutrophils Absolute: 3.3 10*3/uL (ref 1.4–7.0)
Neutrophils: 39 %
Platelets: 288 10*3/uL (ref 150–450)
RBC: 5.95 x10E6/uL — ABNORMAL HIGH (ref 3.77–5.28)
RDW: 15.1 % (ref 11.7–15.4)
WBC: 8.4 10*3/uL (ref 3.4–10.8)

## 2023-03-05 LAB — CMP14+EGFR
ALT: 26 IU/L (ref 0–32)
AST: 22 IU/L (ref 0–40)
Albumin: 4.1 g/dL (ref 3.9–4.9)
Alkaline Phosphatase: 76 IU/L (ref 44–121)
BUN/Creatinine Ratio: 21 (ref 12–28)
BUN: 23 mg/dL (ref 8–27)
Bilirubin Total: 0.5 mg/dL (ref 0.0–1.2)
CO2: 26 mmol/L (ref 20–29)
Calcium: 10 mg/dL (ref 8.7–10.3)
Chloride: 100 mmol/L (ref 96–106)
Creatinine, Ser: 1.08 mg/dL — ABNORMAL HIGH (ref 0.57–1.00)
Globulin, Total: 3.4 g/dL (ref 1.5–4.5)
Glucose: 99 mg/dL (ref 70–99)
Potassium: 3.5 mmol/L (ref 3.5–5.2)
Sodium: 143 mmol/L (ref 134–144)
Total Protein: 7.5 g/dL (ref 6.0–8.5)
eGFR: 57 mL/min/{1.73_m2} — ABNORMAL LOW (ref >59)

## 2023-03-05 LAB — HEMOGLOBIN A1C
Est. average glucose Bld gHb Est-mCnc: 143 mg/dL
Hgb A1c MFr Bld: 6.6 % — ABNORMAL HIGH (ref 4.8–5.6)

## 2023-03-22 ENCOUNTER — Telehealth: Payer: Self-pay | Admitting: Nurse Practitioner

## 2023-03-22 NOTE — Telephone Encounter (Signed)
P.A. RYBELSUS  

## 2023-03-26 NOTE — Telephone Encounter (Signed)
Please call pt about Rybelsus, she wanted to know if coupon available , medicine going to be 14.99 and that is more than her other medicine was

## 2023-03-30 NOTE — Telephone Encounter (Signed)
P.A. approved til 03/23/26, printed & activated discount card should bring the cost down to $10, will call pharmacy

## 2023-04-08 MED ORDER — RYBELSUS 7 MG PO TABS: 7.0000 mg | ORAL_TABLET | Freq: Every day | ORAL | 0 refills | Status: DC

## 2023-04-08 NOTE — Telephone Encounter (Signed)
Called pharmacy & changed Rybelsus to 90 days due to discount card is $10 for 90 days.  They ran with insurance & discount card & price was $10, they are filling. Called pt & informed

## 2023-09-02 ENCOUNTER — Ambulatory Visit: Payer: BC Managed Care – PPO | Admitting: Nurse Practitioner

## 2023-09-02 ENCOUNTER — Encounter: Payer: Self-pay | Admitting: Nurse Practitioner

## 2023-09-02 VITALS — BP 132/80 | HR 87 | Wt 204.0 lb

## 2023-09-02 DIAGNOSIS — E119 Type 2 diabetes mellitus without complications: Secondary | ICD-10-CM | POA: Diagnosis not present

## 2023-09-02 DIAGNOSIS — Z23 Encounter for immunization: Secondary | ICD-10-CM

## 2023-09-02 DIAGNOSIS — E1169 Type 2 diabetes mellitus with other specified complication: Secondary | ICD-10-CM

## 2023-09-02 DIAGNOSIS — D751 Secondary polycythemia: Secondary | ICD-10-CM

## 2023-09-02 DIAGNOSIS — E870 Hyperosmolality and hypernatremia: Secondary | ICD-10-CM

## 2023-09-02 DIAGNOSIS — E669 Obesity, unspecified: Secondary | ICD-10-CM

## 2023-09-02 DIAGNOSIS — I10 Essential (primary) hypertension: Secondary | ICD-10-CM

## 2023-09-02 DIAGNOSIS — E8809 Other disorders of plasma-protein metabolism, not elsewhere classified: Secondary | ICD-10-CM

## 2023-09-02 DIAGNOSIS — E876 Hypokalemia: Secondary | ICD-10-CM | POA: Diagnosis not present

## 2023-09-02 DIAGNOSIS — E559 Vitamin D deficiency, unspecified: Secondary | ICD-10-CM

## 2023-09-02 DIAGNOSIS — E785 Hyperlipidemia, unspecified: Secondary | ICD-10-CM

## 2023-09-02 DIAGNOSIS — E049 Nontoxic goiter, unspecified: Secondary | ICD-10-CM

## 2023-09-02 DIAGNOSIS — E1159 Type 2 diabetes mellitus with other circulatory complications: Secondary | ICD-10-CM

## 2023-09-02 DIAGNOSIS — M79605 Pain in left leg: Secondary | ICD-10-CM

## 2023-09-02 DIAGNOSIS — I152 Hypertension secondary to endocrine disorders: Secondary | ICD-10-CM

## 2023-09-02 MED ORDER — METFORMIN HCL 500 MG PO TABS
500.0000 mg | ORAL_TABLET | Freq: Every day | ORAL | 3 refills | Status: DC
Start: 2023-09-02 — End: 2024-03-09

## 2023-09-02 MED ORDER — LISINOPRIL-HYDROCHLOROTHIAZIDE 20-25 MG PO TABS
1.0000 | ORAL_TABLET | Freq: Every day | ORAL | 3 refills | Status: DC
Start: 2023-09-02 — End: 2023-09-15

## 2023-09-02 MED ORDER — ATORVASTATIN CALCIUM 20 MG PO TABS: 20.0000 mg | ORAL_TABLET | Freq: Every day | ORAL | 3 refills | Status: AC

## 2023-09-02 MED ORDER — RYBELSUS 7 MG PO TABS
7.0000 mg | ORAL_TABLET | Freq: Every day | ORAL | 3 refills | Status: DC
Start: 2023-09-02 — End: 2024-03-04

## 2023-09-02 NOTE — Progress Notes (Signed)
 Dell Fennel, DNP, AGNP-c [facility]  [address] [place], [place] [address] [phone]  ESTABLISHED PATIENT- Chronic Health and/or Follow-Up Visit  Blood pressure 132/80, pulse 87, weight 204 lb (92.5 kg).    Holly Hendrix is a 63 y.o. year old female presenting today for evaluation and management of chronic conditions of DM.   History of Present Illness Holly Hendrix is a 63 year old female who presents with left leg pain and difficulty climbing stairs in addition to DM med check.   She experiences shooting pain in both legs, with the left leg being more affected. The pain is described as an ache that sometimes feels like something is in the back of the leg. It becomes more pronounced when climbing stairs, and she notes that she can bend her right leg better than her left. She sometimes feels as though her leg might 'catch' or 'give out'. She has tried Tylenol  and ibuprofen  for relief, which sometimes helps. No chest pain, shortness of breath, dizziness, numbness, tingling in the feet, or sores or wounds on the feet.  She is currently taking metformin  for diabetes management, but recently ran out of Rybelsus  and has been using metformin  instead. She checks her blood sugar occasionally, reporting levels around 150 mg/dL, which she considers to be within the target range.  She monitors her blood pressure at home and notes it can run high, especially when she is tired or has been active. She has been busy today, having already visited the dentist, which may have contributed to her elevated blood pressure.   All ROS negative with exception of what is listed above.   PHYSICAL EXAM Physical Exam Vitals and nursing note reviewed.  Constitutional:      Appearance: Normal appearance.  HENT:     Head: Normocephalic.  Eyes:     Conjunctiva/sclera: Conjunctivae normal.  Cardiovascular:     Rate and Rhythm: Normal rate and regular rhythm.     Pulses: Normal  pulses.     Heart sounds: Normal heart sounds.  Pulmonary:     Effort: Pulmonary effort is normal.     Breath sounds: Normal breath sounds.  Abdominal:     General: Bowel sounds are normal.     Palpations: Abdomen is soft.  Musculoskeletal:     Right lower leg: No edema.     Left lower leg: No edema.     Comments: No erythema, warmth, or abnormal edema noted. Crepitus present in both knees. Strength intact.   Skin:    Capillary Refill: Capillary refill takes less than 2 seconds.  Neurological:     General: No focal deficit present.     Mental Status: She is alert and oriented to person, place, and time.  Psychiatric:        Mood and Affect: Mood normal.        Behavior: Behavior normal.      PLAN Problem List Items Addressed This Visit     Hypertension associated with diabetes (HCC)   Blood pressure slightly elevated during visit, possibly due to recent activity and stress. She will track her readings with a home blood pressure monitor. - Monitor blood pressure at home for one week - Report if blood pressure consistently exceeds 130/80 mmHg      Relevant Medications   Semaglutide  (RYBELSUS ) 7 MG TABS   atorvastatin  (LIPITOR) 20 MG tablet   lisinopril -hydrochlorothiazide  (ZESTORETIC ) 20-25 MG tablet   metFORMIN  (GLUCOPHAGE ) 500 MG tablet   Hyperlipidemia associated with  type 2 diabetes mellitus (HCC)   Managed with atorvastatin . No alarm symptoms. Continue medication, diet, and exercise.       Relevant Medications   Semaglutide  (RYBELSUS ) 7 MG TABS   atorvastatin  (LIPITOR) 20 MG tablet   lisinopril -hydrochlorothiazide  (ZESTORETIC ) 20-25 MG tablet   metFORMIN  (GLUCOPHAGE ) 500 MG tablet   Other Relevant Orders   Hemoglobin A1c (Completed)   CMP14+EGFR (Completed)   CBC with Differential/Platelet (Completed)   Obesity (BMI 30-39.9)   Recommend diet and exercise management to aid in reduction of risks associated with DM, HTN, HLD. Labs pending. Rybelsus  to hopefully  offer protection of CV risks      Relevant Medications   Semaglutide  (RYBELSUS ) 7 MG TABS   atorvastatin  (LIPITOR) 20 MG tablet   lisinopril -hydrochlorothiazide  (ZESTORETIC ) 20-25 MG tablet   metFORMIN  (GLUCOPHAGE ) 500 MG tablet   Other Relevant Orders   Hemoglobin A1c (Completed)   CMP14+EGFR (Completed)   CBC with Differential/Platelet (Completed)   Enlarged thyroid   Labs pening.       Controlled type 2 diabetes mellitus without complication, without long-term current use of insulin (HCC)   Managed with metformin  and Rybelsus . Blood sugar levels well-controlled, typically around 150 mg/dL. Rybelsus  is affordable and effective. - Continue metformin  - Resume Rybelsus  - Monitor blood sugar levels regularly      Relevant Medications   Semaglutide  (RYBELSUS ) 7 MG TABS   atorvastatin  (LIPITOR) 20 MG tablet   lisinopril -hydrochlorothiazide  (ZESTORETIC ) 20-25 MG tablet   metFORMIN  (GLUCOPHAGE ) 500 MG tablet   Other Relevant Orders   Hemoglobin A1c (Completed)   CMP14+EGFR (Completed)   CBC with Differential/Platelet (Completed)   Microalbumin / creatinine urine ratio (Completed)   Hypokalemia   Repeat labs. Encouraged potassium supplementation as needed based on lab results.       Relevant Orders   CMP14+EGFR (Completed)   Comprehensive metabolic panel with GFR   CBC with Differential/Platelet   Vitamin D deficiency - Primary   Relevant Orders   Vitamin B12 (Completed)   Vitamin D, 25-hydroxy   Polycythemia   Relevant Orders   Comprehensive metabolic panel with GFR   CBC with Differential/Platelet   Hypernatremia   Relevant Orders   Comprehensive metabolic panel with GFR   CBC with Differential/Platelet   Hypoalbuminemia   Relevant Orders   Comprehensive metabolic panel with GFR   CBC with Differential/Platelet   Left leg pain   Intermittent shooting pain in the left leg, particularly when climbing stairs. Possible Baker's cyst or arthritis. No palpable cyst. Pain  likely due to arthritis, exacerbated by activity and relieved by rest and analgesics. - Recommend ice or heat for pain relief - Advise ibuprofen  for inflammation and pain management - Suggest knee brace for stability and pain reduction - Consider referral to orthopedist if symptoms worsen for potential steroid or gel injection      Other Visit Diagnoses       HYPERTENSION, BENIGN ESSENTIAL       Relevant Medications   atorvastatin  (LIPITOR) 20 MG tablet   lisinopril -hydrochlorothiazide  (ZESTORETIC ) 20-25 MG tablet   Other Relevant Orders   Hemoglobin A1c (Completed)   CMP14+EGFR (Completed)   CBC with Differential/Platelet (Completed)       Return in about 6 months (around 03/03/2024) for CPE.  Dell Fennel, DNP, AGNP-c

## 2023-09-02 NOTE — Patient Instructions (Addendum)
 If your blood sugar is running higher than 130/80 at home (on average) please let me know.   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 You can continue to use ibuprofen or tylenol to help. An arthritis brace can also be very helpful when you are up and on your feet a lot.    Osteoarthritis  Osteoarthritis is a type of arthritis. It refers to joint pain or joint disease. Osteoarthritis affects tissue that covers the ends of bones in joints (cartilage). Cartilage acts as a cushion between the bones and helps them move smoothly. Osteoarthritis occurs when cartilage in the joints gets worn down. Osteoarthritis is sometimes called "wear and tear" arthritis. Osteoarthritis is the most common form of arthritis. It often occurs in older people. It is a condition that gets worse over time. The joints most often affected by this condition are in the fingers, toes, hips, knees, and spine, including the neck and lower back. What are the causes? This condition is caused by the wearing down of cartilage that covers the ends of bones. What increases the risk? The following factors may make you more likely to develop this condition: Being age 65 or older. Obesity. Overuse of joints. Past injury of a joint. Past surgery on a joint. Family history of osteoarthritis. What are the signs or symptoms? The main symptoms of this condition are pain, swelling, and stiffness in the joint. Other symptoms may include: An enlarged joint. More pain and further damage caused by small pieces of bone or cartilage that break off and float inside of the joint. Small deposits of bone (osteophytes) that grow on the edges of the joint. A grating or scraping feeling inside the joint when you move it. Popping or creaking sounds when you move. Difficulty walking or exercising. An inability to grip items, twist your hand, or control the movements of your hands and fingers. How is this diagnosed? This condition may be diagnosed  based on: Your medical history. A physical exam. Your symptoms. X-rays of the affected joints. Blood tests to rule out other types of arthritis. How is this treated? There is no cure for this condition, but treatment can help control pain and improve joint function. Treatment may include a combination of therapies, such as: Pain relief techniques, such as: Applying heat and cold to the joint. Massage. A form of talk therapy called cognitive behavioral therapy (CBT). This therapy helps you set goals and follow up on the changes that you make. Medicines for pain and inflammation. The medicines can be taken by mouth or applied to the skin. They include: NSAIDs, such as ibuprofen. Prescription medicines. Strong anti-inflammatory medicines (corticosteroids). Certain nutritional supplements. A prescribed exercise program. You may work with a physical therapist. Assistive devices, such as a brace, wrap, splint, specialized glove, or cane. A weight control plan. Surgery, such as: An osteotomy. This is done to reposition the bones and relieve pain or to remove loose pieces of bone and cartilage. Joint replacement surgery. You may need this surgery if you have advanced osteoarthritis. Follow these instructions at home: Activity Rest your affected joints as told by your health care provider. Exercise as told by your provider. The provider may recommend specific types of exercise, such as: Strengthening exercises. These are done to strengthen the muscles that support joints affected by arthritis. Aerobic activities. These are exercises, such as brisk walking or water aerobics, that increase your heart rate. Range-of-motion activities. These help your joints move more easily. Balance and agility exercises. Managing  pain, stiffness, and swelling     If told, apply heat to the affected area as often as told by your provider. Use the heat source that your provider recommends, such as a moist heat  pack or a heating pad. If you have a removable assistive device, remove it as told by your provider. Place a towel between your skin and the heat source. If your provider tells you to keep the assistive device on while you apply heat, place a towel between the assistive device and the heat source. Leave the heat on for 20-30 minutes. If told, put ice on the affected area. If you have a removable assistive device, remove it as told by your provider. Put ice in a plastic bag. Place a towel between your skin and the bag. If your provider tells you to keep the assistive device on during icing, place a towel between the assistive device and the bag. Leave the ice on for 20 minutes, 2-3 times a day. If your skin turns bright red, remove the ice or heat right away to prevent skin damage. The risk of damage is higher if you cannot feel pain, heat, or cold. Move your fingers or toes often to reduce stiffness and swelling. Raise (elevate) the affected area above the level of your heart while you are sitting or lying down. General instructions Take over-the-counter and prescription medicines only as told by your provider. Maintain a healthy weight. Follow instructions from your provider for weight control. Do not use any products that contain nicotine or tobacco. These products include cigarettes, chewing tobacco, and vaping devices, such as e-cigarettes. If you need help quitting, ask your provider. Use assistive devices as told by your provider. Where to find more information [employer] of Arthritis and Musculoskeletal and Skin Diseases: niams.http://www.myers.net/ [employer] on Aging: BaseRingTones.pl American College of Rheumatology: rheumatology.org Contact a health care provider if: You have redness, swelling, or a feeling of warmth in a joint that gets worse. You have a fever along with joint or muscle aches. You develop a rash. You have trouble doing your normal activities. You have pain that  gets worse and is not relieved by pain medicine. This information is not intended to replace advice given to you by your health care provider. Make sure you discuss any questions you have with your health care provider. Document Revised: 01/03/2022 Document Reviewed: 01/03/2022 Elsevier Patient Education  2024 [employer].

## 2023-09-04 LAB — CMP14+EGFR
ALT: 15 IU/L (ref 0–32)
AST: 14 IU/L (ref 0–40)
Albumin: 3.8 g/dL — ABNORMAL LOW (ref 3.9–4.9)
Alkaline Phosphatase: 69 IU/L (ref 44–121)
BUN/Creatinine Ratio: 12 (ref 12–28)
BUN: 9 mg/dL (ref 8–27)
Bilirubin Total: 0.4 mg/dL (ref 0.0–1.2)
CO2: 27 mmol/L (ref 20–29)
Calcium: 9.5 mg/dL (ref 8.7–10.3)
Chloride: 104 mmol/L (ref 96–106)
Creatinine, Ser: 0.76 mg/dL (ref 0.57–1.00)
Globulin, Total: 2.7 g/dL (ref 1.5–4.5)
Glucose: 111 mg/dL — ABNORMAL HIGH (ref 70–99)
Potassium: 3.3 mmol/L — ABNORMAL LOW (ref 3.5–5.2)
Sodium: 145 mmol/L — ABNORMAL HIGH (ref 134–144)
Total Protein: 6.5 g/dL (ref 6.0–8.5)
eGFR: 87 mL/min/{1.73_m2} (ref >59)

## 2023-09-04 LAB — CBC WITH DIFFERENTIAL/PLATELET
Basophils Absolute: 0.1 10*3/uL (ref 0.0–0.2)
Basos: 1 %
EOS (ABSOLUTE): 0.3 10*3/uL (ref 0.0–0.4)
Eos: 4 %
Hematocrit: 42.8 % (ref 34.0–46.6)
Hemoglobin: 14 g/dL (ref 11.1–15.9)
Immature Grans (Abs): 0 10*3/uL (ref 0.0–0.1)
Immature Granulocytes: 0 %
Lymphocytes Absolute: 3.3 10*3/uL — ABNORMAL HIGH (ref 0.7–3.1)
Lymphs: 46 %
MCH: 26.2 pg — ABNORMAL LOW (ref 26.6–33.0)
MCHC: 32.7 g/dL (ref 31.5–35.7)
MCV: 80 fL (ref 79–97)
Monocytes Absolute: 0.5 10*3/uL (ref 0.1–0.9)
Monocytes: 7 %
Neutrophils Absolute: 2.9 10*3/uL (ref 1.4–7.0)
Neutrophils: 42 %
Platelets: 242 10*3/uL (ref 150–450)
RBC: 5.34 x10E6/uL — ABNORMAL HIGH (ref 3.77–5.28)
RDW: 13.9 % (ref 11.7–15.4)
WBC: 7 10*3/uL (ref 3.4–10.8)

## 2023-09-04 LAB — MICROALBUMIN / CREATININE URINE RATIO
Creatinine, Urine: 100.9 mg/dL
Microalb/Creat Ratio: 10 mg/g{creat} (ref 0–29)
Microalbumin, Urine: 10.3 ug/mL

## 2023-09-04 LAB — VITAMIN B12: Vitamin B-12: 356 pg/mL (ref 232–1245)

## 2023-09-04 LAB — HEMOGLOBIN A1C
Est. average glucose Bld gHb Est-mCnc: 134 mg/dL
Hgb A1c MFr Bld: 6.3 % — ABNORMAL HIGH (ref 4.8–5.6)

## 2023-09-10 ENCOUNTER — Encounter: Payer: Self-pay | Admitting: Nurse Practitioner

## 2023-09-15 ENCOUNTER — Other Ambulatory Visit: Payer: Self-pay

## 2023-09-15 DIAGNOSIS — M79605 Pain in left leg: Secondary | ICD-10-CM

## 2023-09-15 DIAGNOSIS — E119 Type 2 diabetes mellitus without complications: Secondary | ICD-10-CM

## 2023-09-15 DIAGNOSIS — D751 Secondary polycythemia: Secondary | ICD-10-CM | POA: Insufficient documentation

## 2023-09-15 DIAGNOSIS — E8809 Other disorders of plasma-protein metabolism, not elsewhere classified: Secondary | ICD-10-CM

## 2023-09-15 DIAGNOSIS — E559 Vitamin D deficiency, unspecified: Secondary | ICD-10-CM | POA: Insufficient documentation

## 2023-09-15 DIAGNOSIS — I10 Essential (primary) hypertension: Secondary | ICD-10-CM

## 2023-09-15 DIAGNOSIS — E870 Hyperosmolality and hypernatremia: Secondary | ICD-10-CM | POA: Insufficient documentation

## 2023-09-15 DIAGNOSIS — E049 Nontoxic goiter, unspecified: Secondary | ICD-10-CM

## 2023-09-15 DIAGNOSIS — E669 Obesity, unspecified: Secondary | ICD-10-CM

## 2023-09-15 DIAGNOSIS — E876 Hypokalemia: Secondary | ICD-10-CM

## 2023-09-15 DIAGNOSIS — E1169 Type 2 diabetes mellitus with other specified complication: Secondary | ICD-10-CM

## 2023-09-15 HISTORY — DX: Other disorders of plasma-protein metabolism, not elsewhere classified: E88.09

## 2023-09-15 HISTORY — DX: Hyperosmolality and hypernatremia: E87.0

## 2023-09-15 HISTORY — DX: Pain in left leg: M79.605

## 2023-09-15 NOTE — Assessment & Plan Note (Signed)
 Intermittent shooting pain in the left leg, particularly when climbing stairs. Possible Baker's cyst or arthritis. No palpable cyst. Pain likely due to arthritis, exacerbated by activity and relieved by rest and analgesics. - Recommend ice or heat for pain relief - Advise ibuprofen  for inflammation and pain management - Suggest knee brace for stability and pain reduction - Consider referral to orthopedist if symptoms worsen for potential steroid or gel injection

## 2023-09-15 NOTE — Telephone Encounter (Signed)
 Copied from CRM [id number]. Topic: Clinical - Medication Refill >> Sep 15, 2023  2:16 PM Oddis Bench wrote: Most Recent Primary Care Visit:  Provider: EARLY, SARA E  Department: [facility]  Visit Type: MED MGMT 30  Date: 09/02/2023  Medication: potassium chloride  SA (KLOR-CON  M20) 20 MEQ tablet and lisinopril -hydrochlorothiazide  (ZESTORETIC ) 20-25 MG tablet  Has the patient contacted their pharmacy? No (Agent: If no, request that the patient contact the pharmacy for the refill. If patient does not wish to contact the pharmacy document the reason why and proceed with request.) (Agent: If yes, when and what did the pharmacy advise?)  Is this the correct pharmacy for this prescription? Yes If no, delete pharmacy and type the correct one.  This is the patient's preferred pharmacy:  [id number] - [place] ([place] - [address] [place] ([place]) [place] [address] Phone: [phone] Fax: [phone]   Has the prescription been filled recently? Yes  Is the patient out of the medication? Yes  Has the patient been seen for an appointment in the last year OR does the patient have an upcoming appointment? Yes  Can we respond through MyChart? No  Agent: Please be advised that Rx refills may take up to 3 business days. We ask that you follow-up with your pharmacy.

## 2023-09-15 NOTE — Assessment & Plan Note (Signed)
 Managed with atorvastatin . No alarm symptoms. Continue medication, diet, and exercise.

## 2023-09-15 NOTE — Assessment & Plan Note (Signed)
 Managed with metformin  and Rybelsus . Blood sugar levels well-controlled, typically around 150 mg/dL. Rybelsus  is affordable and effective. - Continue metformin  - Resume Rybelsus  - Monitor blood sugar levels regularly

## 2023-09-15 NOTE — Assessment & Plan Note (Signed)
 Recommend diet and exercise management to aid in reduction of risks associated with DM, HTN, HLD. Labs pending. Rybelsus  to hopefully offer protection of CV risks

## 2023-09-15 NOTE — Assessment & Plan Note (Signed)
 Labs pening.

## 2023-09-15 NOTE — Assessment & Plan Note (Signed)
 Repeat labs. Encouraged potassium supplementation as needed based on lab results.

## 2023-09-15 NOTE — Assessment & Plan Note (Signed)
 Blood pressure slightly elevated during visit, possibly due to recent activity and stress. She will track her readings with a home blood pressure monitor. - Monitor blood pressure at home for one week - Report if blood pressure consistently exceeds 130/80 mmHg

## 2023-09-16 MED ORDER — LISINOPRIL-HYDROCHLOROTHIAZIDE 20-25 MG PO TABS: 1.0000 | ORAL_TABLET | Freq: Every day | ORAL | 1 refills | Status: DC

## 2023-09-16 MED ORDER — POTASSIUM CHLORIDE CRYS ER 20 MEQ PO TBCR: 20.0000 meq | EXTENDED_RELEASE_TABLET | Freq: Every day | ORAL | 5 refills | Status: DC

## 2023-09-25 ENCOUNTER — Ambulatory Visit

## 2023-09-25 DIAGNOSIS — E559 Vitamin D deficiency, unspecified: Secondary | ICD-10-CM

## 2023-09-25 DIAGNOSIS — D751 Secondary polycythemia: Secondary | ICD-10-CM

## 2023-09-25 DIAGNOSIS — E8809 Other disorders of plasma-protein metabolism, not elsewhere classified: Secondary | ICD-10-CM

## 2023-09-25 DIAGNOSIS — E876 Hypokalemia: Secondary | ICD-10-CM

## 2023-09-25 DIAGNOSIS — E870 Hyperosmolality and hypernatremia: Secondary | ICD-10-CM

## 2023-09-26 LAB — CBC WITH DIFFERENTIAL/PLATELET
Basophils Absolute: 0.1 10*3/uL (ref 0.0–0.2)
Basos: 1 %
EOS (ABSOLUTE): 0.2 10*3/uL (ref 0.0–0.4)
Eos: 3 %
Hematocrit: 43.7 % (ref 34.0–46.6)
Hemoglobin: 14.1 g/dL (ref 11.1–15.9)
Immature Grans (Abs): 0 10*3/uL (ref 0.0–0.1)
Immature Granulocytes: 0 %
Lymphocytes Absolute: 3.4 10*3/uL — ABNORMAL HIGH (ref 0.7–3.1)
Lymphs: 48 %
MCH: 25.7 pg — ABNORMAL LOW (ref 26.6–33.0)
MCHC: 32.3 g/dL (ref 31.5–35.7)
MCV: 80 fL (ref 79–97)
Monocytes Absolute: 0.4 10*3/uL (ref 0.1–0.9)
Monocytes: 6 %
Neutrophils Absolute: 2.9 10*3/uL (ref 1.4–7.0)
Neutrophils: 42 %
Platelets: 234 10*3/uL (ref 150–450)
RBC: 5.48 x10E6/uL — ABNORMAL HIGH (ref 3.77–5.28)
RDW: 13.8 % (ref 11.7–15.4)
WBC: 7 10*3/uL (ref 3.4–10.8)

## 2023-09-26 LAB — COMPREHENSIVE METABOLIC PANEL WITH GFR
ALT: 11 IU/L (ref 0–32)
AST: 14 IU/L (ref 0–40)
Albumin: 4.1 g/dL (ref 3.9–4.9)
Alkaline Phosphatase: 82 IU/L (ref 44–121)
BUN/Creatinine Ratio: 17 (ref 12–28)
BUN: 13 mg/dL (ref 8–27)
Bilirubin Total: 0.7 mg/dL (ref 0.0–1.2)
CO2: 21 mmol/L (ref 20–29)
Calcium: 9.5 mg/dL (ref 8.7–10.3)
Chloride: 101 mmol/L (ref 96–106)
Creatinine, Ser: 0.76 mg/dL (ref 0.57–1.00)
Globulin, Total: 2.7 g/dL (ref 1.5–4.5)
Glucose: 91 mg/dL (ref 70–99)
Potassium: 3.6 mmol/L (ref 3.5–5.2)
Sodium: 138 mmol/L (ref 134–144)
Total Protein: 6.8 g/dL (ref 6.0–8.5)
eGFR: 87 mL/min/{1.73_m2} (ref >59)

## 2023-09-26 LAB — VITAMIN D 25 HYDROXY (VIT D DEFICIENCY, FRACTURES): Vit D, 25-Hydroxy: 16 ng/mL — ABNORMAL LOW (ref 30.0–100.0)

## 2023-10-15 ENCOUNTER — Ambulatory Visit: Payer: Self-pay | Admitting: Nurse Practitioner

## 2023-12-19 ENCOUNTER — Other Ambulatory Visit: Payer: Self-pay | Admitting: Nurse Practitioner

## 2023-12-19 DIAGNOSIS — Z1231 Encounter for screening mammogram for malignant neoplasm of breast: Secondary | ICD-10-CM

## 2024-01-22 ENCOUNTER — Ambulatory Visit
Admission: RE | Admit: 2024-01-22 | Discharge: 2024-01-22 | Disposition: A | Discharge status: Home / Self Care | Source: Ambulatory Visit | Attending: Nurse Practitioner | Admitting: Nurse Practitioner

## 2024-01-22 DIAGNOSIS — Z1231 Encounter for screening mammogram for malignant neoplasm of breast: Secondary | ICD-10-CM

## 2024-01-28 ENCOUNTER — Ambulatory Visit: Payer: Self-pay | Admitting: Nurse Practitioner

## 2024-03-04 ENCOUNTER — Ambulatory Visit (INDEPENDENT_AMBULATORY_CARE_PROVIDER_SITE_OTHER): Admitting: Nurse Practitioner

## 2024-03-04 ENCOUNTER — Encounter: Payer: Self-pay | Admitting: Nurse Practitioner

## 2024-03-04 VITALS — BP 124/82 | HR 68 | Ht 63.0 in | Wt 204.2 lb

## 2024-03-04 DIAGNOSIS — E669 Obesity, unspecified: Secondary | ICD-10-CM

## 2024-03-04 DIAGNOSIS — E876 Hypokalemia: Secondary | ICD-10-CM | POA: Diagnosis not present

## 2024-03-04 DIAGNOSIS — E1169 Type 2 diabetes mellitus with other specified complication: Secondary | ICD-10-CM

## 2024-03-04 DIAGNOSIS — E119 Type 2 diabetes mellitus without complications: Secondary | ICD-10-CM | POA: Diagnosis not present

## 2024-03-04 DIAGNOSIS — N3941 Urge incontinence: Secondary | ICD-10-CM

## 2024-03-04 DIAGNOSIS — I10 Essential (primary) hypertension: Secondary | ICD-10-CM

## 2024-03-04 DIAGNOSIS — Z23 Encounter for immunization: Secondary | ICD-10-CM

## 2024-03-04 DIAGNOSIS — I152 Hypertension secondary to endocrine disorders: Secondary | ICD-10-CM

## 2024-03-04 DIAGNOSIS — E1159 Type 2 diabetes mellitus with other circulatory complications: Secondary | ICD-10-CM

## 2024-03-04 DIAGNOSIS — Z0000 Encounter for general adult medical examination without abnormal findings: Secondary | ICD-10-CM

## 2024-03-04 DIAGNOSIS — E785 Hyperlipidemia, unspecified: Secondary | ICD-10-CM

## 2024-03-04 DIAGNOSIS — N939 Abnormal uterine and vaginal bleeding, unspecified: Secondary | ICD-10-CM

## 2024-03-04 DIAGNOSIS — E049 Nontoxic goiter, unspecified: Secondary | ICD-10-CM

## 2024-03-04 LAB — LIPID PANEL

## 2024-03-04 MED ORDER — POTASSIUM CHLORIDE CRYS ER 20 MEQ PO TBCR: 20.0000 meq | EXTENDED_RELEASE_TABLET | Freq: Every day | ORAL | 3 refills | Status: AC

## 2024-03-04 MED ORDER — LISINOPRIL-HYDROCHLOROTHIAZIDE 20-25 MG PO TABS: 1.0000 | ORAL_TABLET | Freq: Every day | ORAL | 1 refills | Status: AC

## 2024-03-04 NOTE — Progress Notes (Signed)
 Holly Doing, DNP, AGNP-c [facility] [address] [place], [place] [address] Main Office [phone] VISIT TYPE: CPE on 03/04/2024 Today's Vitals   03/04/24 1451  BP: 124/82  Pulse: 68  Weight: 204 lb 3.2 oz (92.6 kg)  Height: 5' 3 (1.6 m)   Body mass index is 36.17 kg/m. BP 124/82   Pulse 68   Ht 5' 3 (1.6 m)   Wt 204 lb 3.2 oz (92.6 kg)   LMP  (LMP Unknown)   BMI 36.17 kg/m   Subjective:    Patient ID: Holly Hendrix, female    DOB: Feb 11, 1961, 63 y.o.   MRN: [id number]  HPI:  History of Present Illness Holly Hendrix is a 63 year old female who presents for an annual physical exam.  She experiences daily coughing spells without a clear trigger, which sometimes make her feel hot. The cough is more noticeable when she gets hot, and the patient reports it has been about the same over the past few months. It is not associated with eating.  She occasionally feels pressure in her chest, which is brief and not linked to any specific time of day or activity. No shortness of breath is reported.  She has intermittent vaginal bleeding that occurs sporadically, lasting less than a day each time. She describes it as feeling like a cycle but notes it is not a full cycle. She is unsure if it is related to rubbing too hard or other factors.  She manages her diabetes with metformin  and previously used another medication that helped with appetite and weight loss but was discontinued due to cost. She monitors her blood sugar at home but not daily.  She takes atorvastatin  for cholesterol management and potassium supplements, which she finds large and difficult to swallow.  She continues to work at [employer], where her job involves lifting and walking. She enjoys staying busy and plans to continue working for a while. She is involved with her church and stays active in her community.  Pertinent items are noted in HPI.  Most Recent Depression Screen:      03/04/2024    2:49 PM 09/02/2023    1:57 PM 09/09/2022    3:35 PM 07/26/2021    2:32 PM 10/23/2020    8:32 AM  Depression screen PHQ 2/9  Decreased Interest 0 0 0 0 0  Down, Depressed, Hopeless 0 0 0 0 0  PHQ - 2 Score 0 0 0 0 0   Most Recent Anxiety Screen:      No data to display         Most Recent Fall Screen:    03/04/2024    2:49 PM 09/02/2023    1:57 PM 09/09/2022    3:35 PM 07/26/2021    2:32 PM 10/23/2020    8:32 AM  Fall Risk   Falls in the past year? 0 0 0 0 0  Number falls in past yr: 0 0 0 0 0  Injury with Fall? 0 0 0 0 0  Risk for fall due to : No Fall Risks  No Fall Risks No Fall Risks No Fall Risks  Follow up Falls evaluation completed Falls evaluation completed Falls evaluation completed Falls evaluation completed  Falls evaluation completed      Data saved with a previous flowsheet row definition    Past medical history, surgical history, medications, allergies, family history and social history reviewed with patient today and changes made to appropriate areas of the chart.  Past Medical History:  Past Medical History:  Diagnosis Date   Chronic foot pain, left 03/16/2018   Chronic left shoulder pain 04/10/2022   Complete tear of right rotator cuff 02/13/2022   Dental caries 10/05/2009   Qualifier: Diagnosis of   By: Gladis FNP, Delorise      IMO SNOMED Dx Update Oct 2024     Diabetes mellitus type 2, controlled, without complications (HCC)    Diabetes type 2, controlled (HCC) 06/13/2016   Diet controlled    Elevated LDL cholesterol level    Headache    History of low potassium    Hyperlipidemia    Hypernatremia 09/15/2023   Hypertension    Hypoalbuminemia 09/15/2023   Left leg pain 09/15/2023   Left wrist tendonitis 12/16/2022   PMB (postmenopausal bleeding)    Rotator cuff disorder, right    Medications:  Current Outpatient Medications on File Prior to Visit  Medication Sig   Acetaminophen  (TYLENOL  PO) Take 500 mg by mouth. Takes 2 tabs    atorvastatin  (LIPITOR) 20 MG tablet Take 1 tablet (20 mg total) by mouth daily.   Blood Glucose Monitoring Suppl DEVI 1 each by Does not apply route in the morning, at noon, and at bedtime. May substitute to any manufacturer covered by patient's insurance.   No current facility-administered medications on file prior to visit.   Surgical History:  Past Surgical History:  Procedure Laterality Date   DILATATION & CURETTAGE/HYSTEROSCOPY WITH MYOSURE N/A 09/27/2019   Procedure: DILATATION & CURETTAGE/HYSTEROSCOPY WITH MYOSURE;  Surgeon: Cleotilde Ronal RAMAN, MD;  Location: [facility];  Service: Gynecology;  Laterality: N/A;  to follow first case of AM   INDUCED ABORTION     SHOULDER ARTHROSCOPY WITH ROTATOR CUFF REPAIR AND SUBACROMIAL DECOMPRESSION Right 02/13/2022   Procedure: RIGHT SHOULDER ROTATOR CUFF REPAIR, EXTENSIVE DEBRIDEMENT;  Surgeon: Jerri Kay HERO, MD;  Location: [facility];  Service: Orthopedics;  Laterality: Right;   TUBAL LIGATION     Allergies:  No Known Allergies Family History:  Family History  Problem Relation Age of Onset   Arthritis Sister    Diabetes Sister    Hypertension Sister    Cancer Father        unknown    Arthritis Brother    Ulcers Brother    Diabetes Sister    Hypertension Sister    Arthritis Sister    Colon cancer Neg Hx        Objective:    BP 124/82   Pulse 68   Ht 5' 3 (1.6 m)   Wt 204 lb 3.2 oz (92.6 kg)   LMP  (LMP Unknown)   BMI 36.17 kg/m   Wt Readings from Last 3 Encounters:  03/04/24 204 lb 3.2 oz (92.6 kg)  09/02/23 204 lb (92.5 kg)  03/04/23 205 lb (93 kg)    Physical Exam Vitals and nursing note reviewed.  Constitutional:      General: She is not in acute distress.    Appearance: Normal appearance. She is obese. She is not ill-appearing.  HENT:     Head: Normocephalic and atraumatic.     Right Ear: Hearing, tympanic membrane, ear canal and external ear normal.     Left Ear: Hearing, tympanic  membrane, ear canal and external ear normal.     Nose: Nose normal.     Right Sinus: No maxillary sinus tenderness or frontal sinus tenderness.     Left Sinus: No maxillary sinus tenderness or frontal sinus  tenderness.     Mouth/Throat:     Lips: Pink.     Mouth: Mucous membranes are moist.     Pharynx: Oropharynx is clear.  Eyes:     General: Lids are normal. Vision grossly intact.     Extraocular Movements: Extraocular movements intact.     Conjunctiva/sclera: Conjunctivae normal.     Pupils: Pupils are equal, round, and reactive to light.     Funduscopic exam:    Right eye: Red reflex present.        Left eye: Red reflex present.    Visual Fields: Right eye visual fields normal and left eye visual fields normal.  Neck:     Thyroid: No thyromegaly.     Vascular: No carotid bruit.  Cardiovascular:     Rate and Rhythm: Normal rate and regular rhythm.     Chest Wall: PMI is not displaced.     Pulses: Normal pulses.          Dorsalis pedis pulses are 2+ on the right side and 2+ on the left side.       Posterior tibial pulses are 2+ on the right side and 2+ on the left side.     Heart sounds: Normal heart sounds. No murmur heard. Pulmonary:     Effort: Pulmonary effort is normal. No respiratory distress.     Breath sounds: Normal breath sounds.  Abdominal:     General: Abdomen is flat. Bowel sounds are normal. There is no distension.     Palpations: Abdomen is soft. There is no hepatomegaly, splenomegaly or mass.     Tenderness: There is no abdominal tenderness. There is no right CVA tenderness, left CVA tenderness, guarding or rebound.  Musculoskeletal:        General: Normal range of motion.     Cervical back: Full passive range of motion without pain, normal range of motion and neck supple. No tenderness.     Right lower leg: No edema.     Left lower leg: No edema.  Feet:     Left foot:     Toenail Condition: Left toenails are normal.  Lymphadenopathy:     Cervical: No  cervical adenopathy.     Upper Body:     Right upper body: No supraclavicular adenopathy.     Left upper body: No supraclavicular adenopathy.  Skin:    General: Skin is warm and dry.     Capillary Refill: Capillary refill takes less than 2 seconds.     Nails: There is no clubbing.  Neurological:     General: No focal deficit present.     Mental Status: She is alert and oriented to person, place, and time.     GCS: GCS eye subscore is 4. GCS verbal subscore is 5. GCS motor subscore is 6.     Sensory: Sensation is intact. No sensory deficit.     Motor: Motor function is intact. No weakness.     Coordination: Coordination is intact. Coordination normal.     Gait: Gait is intact. Gait normal.     Deep Tendon Reflexes: Reflexes are normal and symmetric.  Psychiatric:        Attention and Perception: Attention normal.        Mood and Affect: Mood normal.        Speech: Speech normal.        Behavior: Behavior normal. Behavior is cooperative.        Thought Content: Thought content normal.  Cognition and Memory: Cognition and memory normal.        Judgment: Judgment normal.      Results for orders placed or performed in visit on 03/04/24  CBC with Differential/Platelet   Collection Time: 03/04/24  3:50 PM  Result Value Ref Range   WBC 8.4 3.4 - 10.8 x10E3/uL   RBC 5.48 (H) 3.77 - 5.28 x10E6/uL   Hemoglobin 14.2 11.1 - 15.9 g/dL   Hematocrit 54.5 65.9 - 46.6 %   MCV 83 79 - 97 fL   MCH 25.9 (L) 26.6 - 33.0 pg   MCHC 31.3 (L) 31.5 - 35.7 g/dL   RDW 85.5 88.2 - 84.5 %   Platelets 239 150 - 450 x10E3/uL   Neutrophils 42 Not Estab. %   Lymphs 50 Not Estab. %   Monocytes 5 Not Estab. %   Eos 2 Not Estab. %   Basos 1 Not Estab. %   Neutrophils Absolute 3.5 1.4 - 7.0 x10E3/uL   Lymphocytes Absolute 4.1 (H) 0.7 - 3.1 x10E3/uL   Monocytes Absolute 0.5 0.1 - 0.9 x10E3/uL   EOS (ABSOLUTE) 0.2 0.0 - 0.4 x10E3/uL   Basophils Absolute 0.1 0.0 - 0.2 x10E3/uL   Immature Granulocytes  0 Not Estab. %   Immature Grans (Abs) 0.0 0.0 - 0.1 x10E3/uL  CMP14+EGFR   Collection Time: 03/04/24  3:50 PM  Result Value Ref Range   Glucose 96 70 - 99 mg/dL   BUN 13 8 - 27 mg/dL   Creatinine, Ser 9.14 0.57 - 1.00 mg/dL   eGFR 76 >40 fO/fpw/8.26   BUN/Creatinine Ratio 15 12 - 28   Sodium 140 134 - 144 mmol/L   Potassium 3.8 3.5 - 5.2 mmol/L   Chloride 100 96 - 106 mmol/L   CO2 25 20 - 29 mmol/L   Calcium  9.6 8.7 - 10.3 mg/dL   Total Protein 7.3 6.0 - 8.5 g/dL   Albumin 4.2 3.9 - 4.9 g/dL   Globulin, Total 3.1 1.5 - 4.5 g/dL   Bilirubin Total 0.6 0.0 - 1.2 mg/dL   Alkaline Phosphatase 79 49 - 135 IU/L   AST 13 0 - 40 IU/L   ALT 15 0 - 32 IU/L  Hemoglobin A1c   Collection Time: 03/04/24  3:50 PM  Result Value Ref Range   Hgb A1c MFr Bld 6.5 (H) 4.8 - 5.6 %   Est. average glucose Bld gHb Est-mCnc 140 mg/dL  Lipid panel   Collection Time: 03/04/24  3:50 PM  Result Value Ref Range   Cholesterol, Total 115 100 - 199 mg/dL   Triglycerides 64 0 - 149 mg/dL   HDL 49 >60 mg/dL   VLDL Cholesterol Cal 14 5 - 40 mg/dL   LDL Chol Calc (NIH) 52 0 - 99 mg/dL   Chol/HDL Ratio 2.3 0.0 - 4.4 ratio       Assessment & Plan:   Problem List Items Addressed This Visit     Obesity, morbid (HCC)   Obesity in the setting of diabetes, htn, and hld Recommend close monitoring and control of co-morbidities. Daily activity of at least 20 minutes strongly encouraged.       Hypertension associated with diabetes (HCC)   Hypertension associated with diabetes managed with lisinopril -hydrochlorothiazide . Well controlled with no alarm symptoms.  - Continue current medication regimen. - Labs pending.       Relevant Medications   lisinopril -hydrochlorothiazide  (ZESTORETIC ) 20-25 MG tablet   potassium chloride  SA (KLOR-CON  M20) 20 MEQ tablet   Other Relevant  Orders   Flu vaccine HIGH DOSE PF(Fluzone Trivalent) (Completed)   Pfizer Comirnaty Covid-19 Vaccine 72yrs & older (Completed)   CBC with  Differential/Platelet (Completed)   CMP14+EGFR (Completed)   Hemoglobin A1c (Completed)   Lipid panel (Completed)   Hyperlipidemia associated with type 2 diabetes mellitus (HCC)   Hyperlipidemia associated with Diabetes managed with atorvastatin . Diet and exercise recommended.  - Continue atorvastatin  as prescribed      Relevant Medications   lisinopril -hydrochlorothiazide  (ZESTORETIC ) 20-25 MG tablet   potassium chloride  SA (KLOR-CON  M20) 20 MEQ tablet   Other Relevant Orders   Flu vaccine HIGH DOSE PF(Fluzone Trivalent) (Completed)   Pfizer Comirnaty Covid-19 Vaccine 56yrs & older (Completed)   CBC with Differential/Platelet (Completed)   CMP14+EGFR (Completed)   Hemoglobin A1c (Completed)   Lipid panel (Completed)   Ambulatory referral to Obstetrics / Gynecology   Enlarged thyroid   Monitor labs for evaluation, particularly with recent vaginal bleeding.       Relevant Medications   potassium chloride  SA (KLOR-CON  M20) 20 MEQ tablet   Controlled type 2 diabetes mellitus without complication, without long-term current use of insulin (HCC)   Type 2 diabetes mellitus managed with metformin . Challenges with medication costs, particularly with a previous medication not covered by insurance. Reports feeling fuller and eating less on the previous medication. - Continue metformin  - Discuss with insurance about coverage for previous medication - Consider restarting previous medication if insurance coverage is obtained      Relevant Medications   lisinopril -hydrochlorothiazide  (ZESTORETIC ) 20-25 MG tablet   potassium chloride  SA (KLOR-CON  M20) 20 MEQ tablet   Other Relevant Orders   Flu vaccine HIGH DOSE PF(Fluzone Trivalent) (Completed)   Pfizer Comirnaty Covid-19 Vaccine 32yrs & older (Completed)   CBC with Differential/Platelet (Completed)   CMP14+EGFR (Completed)   Hemoglobin A1c (Completed)   Lipid panel (Completed)   Vaginal spotting   Intermittent postmenopausal vaginal  bleeding occurring occasionally, lasting less than a day. Possible causes include vaginal dryness or minor trauma. Last Pap smear was in 2020. - Refer to OB/GYN for evaluation and Pap smear - Recommend US - will wait to get GYN input to ensure the most appropriate measures are taken.       Relevant Orders   Ambulatory referral to Obstetrics / Gynecology   Annual physical exam - Primary   CPE completed today. Review of HM activities and recommendations discussed and provided on AVS. Anticipatory guidance, diet, and exercise recommendations provided. Medications, allergies, and hx reviewed and updated as necessary. Orders placed as listed below.  Plan: - Labs ordered. Will make changes as necessary based on results.  - I will review these results and send recommendations via MyChart or a telephone call.  - F/U with CPE in 1 year or sooner for acute/chronic health needs as directed.        Need for influenza vaccination   Relevant Orders   Flu vaccine HIGH DOSE PF(Fluzone Trivalent) (Completed)   Obesity (BMI 30-39.9)   Relevant Medications   lisinopril -hydrochlorothiazide  (ZESTORETIC ) 20-25 MG tablet   potassium chloride  SA (KLOR-CON  M20) 20 MEQ tablet   Other Relevant Orders   Flu vaccine HIGH DOSE PF(Fluzone Trivalent) (Completed)   Pfizer Comirnaty Covid-19 Vaccine 74yrs & older (Completed)   CBC with Differential/Platelet (Completed)   CMP14+EGFR (Completed)   Hemoglobin A1c (Completed)   Lipid panel (Completed)   RESOLVED: Hypokalemia   Relevant Medications   potassium chloride  SA (KLOR-CON  M20) 20 MEQ tablet   Other Relevant Orders   Flu  vaccine HIGH DOSE PF(Fluzone Trivalent) (Completed)   Pfizer Comirnaty Covid-19 Vaccine 59yrs & older (Completed)   CBC with Differential/Platelet (Completed)   CMP14+EGFR (Completed)   Hemoglobin A1c (Completed)   Lipid panel (Completed)   INCONTINENCE, URGE   Other Visit Diagnoses       Need for COVID-19 vaccine       Relevant Orders    Pfizer Comirnaty Covid-19 Vaccine 12yrs & older (Completed)          Follow up plan: No follow-ups on file.  NEXT PREVENTATIVE PHYSICAL DUE IN 1 YEAR.  PATIENT COUNSELING PROVIDED FOR ALL ADULT PATIENTS: A well balanced diet low in saturated fats, cholesterol, and moderation in carbohydrates.  This can be as simple as monitoring portion sizes and cutting back on sugary beverages such as soda and juice to start with.    Daily water consumption of at least 64 ounces.  Physical activity at least 180 minutes per week.  If just starting out, start 10 minutes a day and work your way up.   This can be as simple as taking the stairs instead of the elevator and walking 2-3 laps around the office  purposefully every day.   STD protection, partner selection, and regular testing if high risk.  Limited consumption of alcoholic beverages if alcohol is consumed. For men, I recommend no more than 14 alcoholic beverages per week, spread out throughout the week (max 2 per day). Avoid binge drinking or consuming large quantities of alcohol in one setting.  Please let me know if you feel you may need help with reduction or quitting alcohol consumption.   Avoidance of nicotine, if used. Please let me know if you feel you may need help with reduction or quitting nicotine use.   Daily mental health attention. This can be in the form of 5 minute daily meditation, prayer, journaling, yoga, reflection, etc.  Purposeful attention to your emotions and mental state can significantly improve your overall wellbeing  and  Health.  Please know that I am here to help you with all of your health care goals and am happy to work with you to find a solution that works best for you.  The greatest advice I have received with any changes in life are to take it one step at a time, that even means if all you can focus on is the next 60 seconds, then do that and celebrate your victories.  With any changes in life,  you will have set backs, and that is OK. The important thing to remember is, if you have a set back, it is not a failure, it is an opportunity to try again! Screening Testing Mammogram Every 1 -2 years based on history and risk factors Starting at age 44 Pap Smear Ages 21-39 every 3 years Ages 80-65 every 5 years with HPV testing More frequent testing may be required based on results and history Colon Cancer Screening Every 1-10 years based on test performed, risk factors, and history Starting at age 69 Bone Density Screening Every 2-10 years based on history Starting at age 29 for women Recommendations for men differ based on medication usage, history, and risk factors AAA Screening One time ultrasound Men 72-60 years old who have every smoked Lung Cancer Screening Low Dose Lung CT every 12 months Age 55-80 years with a 30 pack-year smoking history who still smoke or who have quit within the last 15 years   Screening Labs Routine  Labs: Complete Blood Count (CBC),  Complete Metabolic Panel (CMP), Cholesterol (Lipid Panel) Every 6-12 months based on history and medications May be recommended more frequently based on current conditions or previous results Hemoglobin A1c Lab Every 3-12 months based on history and previous results Starting at age 68 or earlier with diagnosis of diabetes, high cholesterol, BMI >26, and/or risk factors Frequent monitoring for patients with diabetes to ensure blood sugar control Thyroid Panel (TSH) Every 6 months based on history, symptoms, and risk factors May be repeated more often if on medication HIV One time testing for all patients 34 and older May be repeated more frequently for patients with increased risk factors or exposure Hepatitis C One time testing for all patients 26 and older May be repeated more frequently for patients with increased risk factors or exposure Gonorrhea, Chlamydia Every 12 months for all sexually active persons 13-24  years Additional monitoring may be recommended for those who are considered high risk or who have symptoms Every 12 months for any woman on birth control, regardless of sexual activity PSA Men 59-68 years old with risk factors Additional screening may be recommended from age 68-69 based on risk factors, symptoms, and history  Vaccine Recommendations Tetanus Booster All adults every 10 years Flu Vaccine All patients 6 months and older every year COVID Vaccine All patients 12 years and older Initial dosing with booster May recommend additional booster based on age and health history HPV Vaccine 2 doses all patients age 57-26 Dosing may be considered for patients over 26 Shingles Vaccine (Shingrix ) 2 doses all adults 55 years and older Pneumonia (Pneumovax 23) All adults 65 years and older May recommend earlier dosing based on health history One year apart from Prevnar 13 Pneumonia (Prevnar 70) All adults 65 years and older Dosed 1 year after Pneumovax 23 Pneumonia (Prevnar 20) One time alternative to the two dosing of 13 and 23 For all adults with initial dose of 23, 20 is recommended 1 year later For all adults with initial dose of 13, 23 is still recommended as second option 1 year later

## 2024-03-04 NOTE — Patient Instructions (Signed)
 I sent the OB GYN referral to [facility]. They are at [address]

## 2024-03-05 LAB — CMP14+EGFR
ALT: 15 IU/L (ref 0–32)
AST: 13 IU/L (ref 0–40)
Albumin: 4.2 g/dL (ref 3.9–4.9)
Alkaline Phosphatase: 79 IU/L (ref 49–135)
BUN/Creatinine Ratio: 15 (ref 12–28)
BUN: 13 mg/dL (ref 8–27)
Bilirubin Total: 0.6 mg/dL (ref 0.0–1.2)
CO2: 25 mmol/L (ref 20–29)
Calcium: 9.6 mg/dL (ref 8.7–10.3)
Chloride: 100 mmol/L (ref 96–106)
Creatinine, Ser: 0.85 mg/dL (ref 0.57–1.00)
Globulin, Total: 3.1 g/dL (ref 1.5–4.5)
Glucose: 96 mg/dL (ref 70–99)
Potassium: 3.8 mmol/L (ref 3.5–5.2)
Sodium: 140 mmol/L (ref 134–144)
Total Protein: 7.3 g/dL (ref 6.0–8.5)
eGFR: 76 mL/min/1.73 (ref >59)

## 2024-03-05 LAB — CBC WITH DIFFERENTIAL/PLATELET
Basophils Absolute: 0.1 x10E3/uL (ref 0.0–0.2)
Basos: 1 %
EOS (ABSOLUTE): 0.2 x10E3/uL (ref 0.0–0.4)
Eos: 2 %
Hematocrit: 45.4 % (ref 34.0–46.6)
Hemoglobin: 14.2 g/dL (ref 11.1–15.9)
Immature Grans (Abs): 0 x10E3/uL (ref 0.0–0.1)
Immature Granulocytes: 0 %
Lymphocytes Absolute: 4.1 x10E3/uL — ABNORMAL HIGH (ref 0.7–3.1)
Lymphs: 50 %
MCH: 25.9 pg — ABNORMAL LOW (ref 26.6–33.0)
MCHC: 31.3 g/dL — ABNORMAL LOW (ref 31.5–35.7)
MCV: 83 fL (ref 79–97)
Monocytes Absolute: 0.5 x10E3/uL (ref 0.1–0.9)
Monocytes: 5 %
Neutrophils Absolute: 3.5 x10E3/uL (ref 1.4–7.0)
Neutrophils: 42 %
Platelets: 239 x10E3/uL (ref 150–450)
RBC: 5.48 x10E6/uL — ABNORMAL HIGH (ref 3.77–5.28)
RDW: 14.4 % (ref 11.7–15.4)
WBC: 8.4 x10E3/uL (ref 3.4–10.8)

## 2024-03-05 LAB — LIPID PANEL
Chol/HDL Ratio: 2.3 ratio (ref 0.0–4.4)
Cholesterol, Total: 115 mg/dL (ref 100–199)
HDL: 49 mg/dL (ref >39)
LDL Chol Calc (NIH): 52 mg/dL (ref 0–99)
Triglycerides: 64 mg/dL (ref 0–149)
VLDL Cholesterol Cal: 14 mg/dL (ref 5–40)

## 2024-03-05 LAB — HEMOGLOBIN A1C
Est. average glucose Bld gHb Est-mCnc: 140 mg/dL
Hgb A1c MFr Bld: 6.5 % — ABNORMAL HIGH (ref 4.8–5.6)

## 2024-03-09 ENCOUNTER — Other Ambulatory Visit: Payer: Self-pay | Admitting: Nurse Practitioner

## 2024-03-09 DIAGNOSIS — E119 Type 2 diabetes mellitus without complications: Secondary | ICD-10-CM

## 2024-03-11 ENCOUNTER — Ambulatory Visit: Payer: Self-pay | Admitting: Nurse Practitioner

## 2024-03-16 DIAGNOSIS — Z23 Encounter for immunization: Secondary | ICD-10-CM | POA: Insufficient documentation

## 2024-03-16 DIAGNOSIS — Z0000 Encounter for general adult medical examination without abnormal findings: Secondary | ICD-10-CM | POA: Insufficient documentation

## 2024-03-16 NOTE — Assessment & Plan Note (Signed)
 Type 2 diabetes mellitus managed with metformin . Challenges with medication costs, particularly with a previous medication not covered by insurance. Reports feeling fuller and eating less on the previous medication. - Continue metformin  - Discuss with insurance about coverage for previous medication - Consider restarting previous medication if insurance coverage is obtained

## 2024-03-16 NOTE — Assessment & Plan Note (Signed)
 Obesity in the setting of diabetes, htn, and hld Recommend close monitoring and control of co-morbidities. Daily activity of at least 20 minutes strongly encouraged.

## 2024-03-16 NOTE — Assessment & Plan Note (Signed)

## 2024-03-16 NOTE — Assessment & Plan Note (Signed)
 Intermittent postmenopausal vaginal bleeding occurring occasionally, lasting less than a day. Possible causes include vaginal dryness or minor trauma. Last Pap smear was in 2020. - Refer to OB/GYN for evaluation and Pap smear - Recommend US - will wait to get GYN input to ensure the most appropriate measures are taken.

## 2024-03-16 NOTE — Assessment & Plan Note (Signed)
 Hyperlipidemia associated with Diabetes managed with atorvastatin . Diet and exercise recommended.  - Continue atorvastatin  as prescribed

## 2024-03-16 NOTE — Assessment & Plan Note (Signed)
 Hypertension associated with diabetes managed with lisinopril -hydrochlorothiazide . Well controlled with no alarm symptoms.  - Continue current medication regimen. - Labs pending.

## 2024-03-16 NOTE — Assessment & Plan Note (Signed)
 Monitor labs for evaluation, particularly with recent vaginal bleeding.

## 2024-06-04 ENCOUNTER — Other Ambulatory Visit: Payer: Self-pay | Admitting: Nurse Practitioner

## 2024-06-04 DIAGNOSIS — E119 Type 2 diabetes mellitus without complications: Secondary | ICD-10-CM

## 2024-06-10 ENCOUNTER — Telehealth: Payer: Self-pay

## 2024-06-10 ENCOUNTER — Other Ambulatory Visit: Payer: Self-pay | Admitting: Nurse Practitioner

## 2024-06-10 NOTE — Telephone Encounter (Signed)
 Copied from CRM #[id number]. Topic: Clinical - Medication Question >> Jun 10, 2024  3:13 PM Willma R wrote: Reason for CRM: Patient states that her provider prescribed RYBELSUS  for her to try. States she likes it better than the metformin  and wants to see is she can get a prescription for that if it can be around $10 for her to pay.  Patient can be reached at [phone]

## 2024-06-11 ENCOUNTER — Telehealth: Payer: Self-pay

## 2024-06-11 ENCOUNTER — Other Ambulatory Visit: Payer: Self-pay

## 2024-06-11 MED ORDER — RYBELSUS 7 MG PO TABS: 7.0000 mg | ORAL_TABLET | Freq: Every day | ORAL | 0 refills | Status: AC

## 2024-06-11 NOTE — Telephone Encounter (Signed)
 Copied from CRM #[id number]. Topic: Clinical - Medication Question >> Jun 10, 2024  3:13 PM Willma R wrote: Reason for CRM: Patient states that her provider prescribed RYBELSUS  for her to try. States she likes it better than the metformin  and wants to see is she can get a prescription for that if it can be around $10 for her to pay.  Patient can be reached at [phone] >> Jun 11, 2024 10:00 AM Drema MATSU wrote: Pt stated that she didn.t have any side effects from Rybelsus . Advise pt of message.

## 2025-03-10 ENCOUNTER — Encounter: Admitting: Nurse Practitioner
